# Patient Record
Sex: Female | Born: 1940 | Race: White | Hispanic: No | Marital: Married | Smoking: Never smoker
Health system: Southern US, Community
[De-identification: ages and names within clinical notes are randomized; demographics above are authoritative.]

## PROBLEM LIST (undated history)

## (undated) DIAGNOSIS — E079 Disorder of thyroid, unspecified: Secondary | ICD-10-CM

## (undated) DIAGNOSIS — F4310 Post-traumatic stress disorder, unspecified: Secondary | ICD-10-CM

## (undated) DIAGNOSIS — M1990 Unspecified osteoarthritis, unspecified site: Secondary | ICD-10-CM

## (undated) DIAGNOSIS — I10 Essential (primary) hypertension: Secondary | ICD-10-CM

## (undated) DIAGNOSIS — E039 Hypothyroidism, unspecified: Secondary | ICD-10-CM

## (undated) DIAGNOSIS — E785 Hyperlipidemia, unspecified: Secondary | ICD-10-CM

## (undated) DIAGNOSIS — R079 Chest pain, unspecified: Secondary | ICD-10-CM

## (undated) DIAGNOSIS — G4733 Obstructive sleep apnea (adult) (pediatric): Secondary | ICD-10-CM

## (undated) DIAGNOSIS — H9190 Unspecified hearing loss, unspecified ear: Secondary | ICD-10-CM

## (undated) DIAGNOSIS — E7800 Pure hypercholesterolemia, unspecified: Secondary | ICD-10-CM

## (undated) DIAGNOSIS — K922 Gastrointestinal hemorrhage, unspecified: Secondary | ICD-10-CM

## (undated) DIAGNOSIS — D126 Benign neoplasm of colon, unspecified: Secondary | ICD-10-CM

## (undated) HISTORY — DX: Pure hypercholesterolemia, unspecified: E78.00

## (undated) HISTORY — DX: Unspecified hearing loss, unspecified ear: H91.90

## (undated) HISTORY — DX: Chest pain, unspecified: R07.9

## (undated) HISTORY — DX: Essential (primary) hypertension: I10

## (undated) HISTORY — PX: CHOLECYSTECTOMY: SHX55

## (undated) HISTORY — DX: Obstructive sleep apnea (adult) (pediatric): G47.33

## (undated) HISTORY — PX: KNEE SURGERY: SHX244

## (undated) HISTORY — DX: Gastrointestinal hemorrhage, unspecified: K92.2

## (undated) HISTORY — PX: CARDIAC CATHETERIZATION: SHX172

## (undated) HISTORY — DX: Unspecified osteoarthritis, unspecified site: M19.90

## (undated) HISTORY — DX: Hyperlipidemia, unspecified: E78.5

---

## 1997-12-27 ENCOUNTER — Ambulatory Visit (HOSPITAL_COMMUNITY): Admission: RE | Admit: 1997-12-27 | Discharge: 1997-12-27 | Payer: Self-pay | Admitting: Family Medicine

## 1998-05-31 ENCOUNTER — Ambulatory Visit (HOSPITAL_COMMUNITY): Admission: RE | Admit: 1998-05-31 | Discharge: 1998-05-31 | Payer: Self-pay | Admitting: Family Medicine

## 1998-05-31 ENCOUNTER — Encounter: Payer: Self-pay | Admitting: Family Medicine

## 1998-07-20 ENCOUNTER — Ambulatory Visit (HOSPITAL_COMMUNITY): Admission: RE | Admit: 1998-07-20 | Discharge: 1998-07-20 | Payer: Self-pay | Admitting: Family Medicine

## 1998-07-20 ENCOUNTER — Encounter: Payer: Self-pay | Admitting: Family Medicine

## 1999-09-25 ENCOUNTER — Other Ambulatory Visit: Admission: RE | Admit: 1999-09-25 | Discharge: 1999-09-25 | Payer: Self-pay | Admitting: Family Medicine

## 1999-10-08 ENCOUNTER — Encounter: Admission: RE | Admit: 1999-10-08 | Discharge: 1999-10-08 | Payer: Self-pay | Admitting: Family Medicine

## 1999-10-08 ENCOUNTER — Encounter: Payer: Self-pay | Admitting: Family Medicine

## 2000-05-19 ENCOUNTER — Ambulatory Visit (HOSPITAL_BASED_OUTPATIENT_CLINIC_OR_DEPARTMENT_OTHER): Admission: RE | Admit: 2000-05-19 | Discharge: 2000-05-19 | Payer: Self-pay | Admitting: Orthopaedic Surgery

## 2000-10-23 ENCOUNTER — Other Ambulatory Visit: Admission: RE | Admit: 2000-10-23 | Discharge: 2000-10-23 | Payer: Self-pay | Admitting: Family Medicine

## 2000-11-16 ENCOUNTER — Ambulatory Visit (HOSPITAL_COMMUNITY): Admission: RE | Admit: 2000-11-16 | Discharge: 2000-11-16 | Payer: Self-pay | Admitting: Family Medicine

## 2000-11-16 ENCOUNTER — Encounter: Payer: Self-pay | Admitting: Family Medicine

## 2001-12-02 ENCOUNTER — Inpatient Hospital Stay (HOSPITAL_COMMUNITY): Admission: RE | Admit: 2001-12-02 | Discharge: 2001-12-06 | Payer: Self-pay | Admitting: Orthopaedic Surgery

## 2003-01-31 ENCOUNTER — Ambulatory Visit (HOSPITAL_COMMUNITY): Admission: RE | Admit: 2003-01-31 | Discharge: 2003-01-31 | Payer: Self-pay | Admitting: Family Medicine

## 2003-01-31 ENCOUNTER — Encounter: Payer: Self-pay | Admitting: Family Medicine

## 2006-04-22 ENCOUNTER — Other Ambulatory Visit: Admission: RE | Admit: 2006-04-22 | Discharge: 2006-04-22 | Payer: Self-pay | Admitting: Family Medicine

## 2008-01-21 ENCOUNTER — Other Ambulatory Visit: Admission: RE | Admit: 2008-01-21 | Discharge: 2008-01-21 | Payer: Self-pay | Admitting: Family Medicine

## 2008-02-04 ENCOUNTER — Encounter: Admission: RE | Admit: 2008-02-04 | Discharge: 2008-02-04 | Payer: Self-pay | Admitting: Family Medicine

## 2008-02-15 ENCOUNTER — Encounter: Admission: RE | Admit: 2008-02-15 | Discharge: 2008-02-15 | Payer: Self-pay | Admitting: Family Medicine

## 2010-05-19 ENCOUNTER — Encounter: Payer: Self-pay | Admitting: Family Medicine

## 2010-09-13 NOTE — Op Note (Signed)
NAMESHAWNTIA, MANGAL                               ACCOUNT NO.:  [id number]   MEDICAL RECORD NO.:  [id number]                   PATIENT TYPE:  INP   LOCATION:  5027                                 FACILITY:  [facility]   PHYSICIAN:  Lubertha Basque. Jerl Santos, M.D.             DATE OF BIRTH:  May 12, 1940   DATE OF PROCEDURE:  12/02/2001  DATE OF DISCHARGE:                                 OPERATIVE REPORT   PREOPERATIVE DIAGNOSIS:  Left knee degenerative arthritis.   POSTOPERATIVE DIAGNOSIS:  Left knee degenerative arthritis.   PROCEDURE:  Left total knee replacement.   SURGEON:  Lubertha Basque. Jerl Santos, M.D.   ASSISTANT:  Prince Rome, P.A.   ANESTHESIA:  General and femoral block.   INDICATIONS FOR PROCEDURE:  The patient is a 70 year old woman with a long  history of left knee pain.  This has been persistent despite oral anti-  inflammatories, injections of various agents and even an arthroscopy in the  past at which point significant degeneration was seen.  She has been left  with pain at rest and pain with activity and is offered a knee replacement  operation.  The procedure was discussed with the patient and informed  operative consent was obtained after discussion of possible complications  of, reaction to anesthesia, infection, DVT, PE, and death.   DESCRIPTION OF PROCEDURE:  The patient was taken to the operating suite  where general anesthetic was applied without difficulty.  She was positioned  and prepped and draped in the normal sterile fashion.  After the  administration of preoperative antibiotics, the left leg was elevated,  exsanguinated and tourniquet inflated up to 5.  An anterior approach was  taken to the knee through a longitudinal incision.  Dissection was taken  down to the extensor mechanism.  This was incised for a medial parapatellar  approach.  The knee cap was flipped and the knee flexed.  Some residual  meniscal tissues in the ACL and PCL were removed.  She had  good bone  quality.  She had severe degenerative change medial and patellofemoral  compartments.  A guide was placed on the tibia extramedullary to create a  cut on the tibia parallel with the floor with a slight posterior tilt.  This  had to be revised once.  The femur sized to a standard size component, and  an intramedullary guide was placed and utilized to make anterior and  posterior cuts creating a flexion gap of 10 mm.  A second intramedullary  guide was placed to create a distal cut on the femur making an extension gap  of the 10 mm and equal to the flexion gap.  A bit of soft tissue was  released on the medial aspect was done with a Cobb elevator.  The femur  chamfer guide was placed and used.  The tibia sized to a size 3 and  appropriate guide was placed and used to place a keel component.  The trial  reduction as done with a standard femur, size 3 tibia and 10 mm deep dish  spacer.  She came to slight hyperextension and flexed easily.  The knee cap  tracked well and no lateral release was required.  About 11 or 12 mm of bone  were cut off the patella and this fit a size 32 component.  The appropriate  drill holes were made with the guide.  Trial components were removed, and  pulsatile lavage was used to clean off cut bony surfaces.  Cement was mixed  including antibiotics and this was pressurized on to all three cut bony  surfaces followed by placement of the Dupuy LCS components which were a  standard femur, 3 tibia and 32 patella.  A 10 mm deep dish spacer as  applied.  Excess cement was trimmed, and pressure was held on the components  until the cement had hardened.  Again, the knee tracked well from full  extension to 120 of flexion.  The knee cap tracked well.  The wound was  again irrigated followed by release of the tourniquet.  A small amount of  bleeding was easily controlled with Bovie cautery.  Drain was placed exiting  superior laterally followed by reapproximation  with the extensor mechanism  with #1 Vicryl in an interrupted fashion.  Once this was accomplished, the  knee flexed to 120 stabilely.  Again, the wound was irrigated, followed by  reapproximation of subcutaneous tissues with 0 and 2-0 undyed Vicryl.  The  skin was closed with nylon as she has a nickel allergy.  Adaptic was applied  to the wound followed by a dry gauze and a loose Ace wrap.  Estimated blood  loss and intraoperative fluids can be obtained from the anesthesia records  as can accurate tourniquet time which was just under one hour.  A femoral  nerve block was applied at the end of the case.   DISPOSITION:  The patient was extubated in the operating room and taken to  the recovery room in stable condition.  Plans were for her to be admitted to  the orthopedic surgery surface for postoperative care to include  perioperative antibiotics and Coumadin for DVT prophylaxis.                                               Lubertha Basque Jerl Santos, M.D.    PGD/MEDQ  D:  12/02/2001  T:  12/06/2001  Job:  [id number]

## 2010-09-13 NOTE — Op Note (Signed)
[place]  Patient:    Cheryl Velez, Cheryl Velez                              MRN: [id number] Proc. Date: 05/19/00 Adm. Date:  [id number] Attending:  Marcene Corning                           Operative Report  PREOPERATIVE DIAGNOSES: 1. Left knee degenerative joint disease. 2. Left knee torn medial meniscus.  POSTOPERATIVE DIAGNOSES: 1. Left knee degenerative joint disease. 2. Left knee torn medial meniscus.  PROCEDURES: 1. Left knee thorough chondroplasty. 2. Left knee partial medial meniscectomy.  ANESTHESIA:  Knee block.  SURGEON:  Lubertha Basque. Jerl Santos, M.D.  ASSISTANT:  Prince Rome, P.A.  INDICATION FOR PROCEDURE:  The patient is a 70 year old woman with a long history of left knee pain.  It has persisted despite anti-inflammatories and injection.  By x-ray, she did have some medial joint space narrowing, and she had some signs on exam of degenerative arthritis and medial joint line pain. The planned procedure at this point is for an arthroscopy.  The procedure was discussed with the patient, and informed operative consent was obtained after a discussion of the possible complications of, reaction to anesthesia, and infection.  DESCRIPTION OF PROCEDURE:  The patient was taken to the operating suite, where a knee block anesthetic was applied without difficulty.  She was positioned supine and prepped and draped in normal sterile fashion.  After the administration of preoperative IV antibiotics, an arthroscopy of the left knee was performed through two inferior portals.  The suprapatellar pouch was benign, while the patellofemoral joint exhibited some grade 3 chondromalacia, addressed with a chondroplasty.  In the medial compartment, she had some large flap tears on the medial femoral condyle, which were addressed with a chondroplasty.  She did have some grade 4 change in the compartment.  She also had a degenerative tear of the posterior horn of  the medial meniscus, which required removal of about 20% of the structure back to a stable rim.  The ACL and PCL were intact, though the notch was stenotic.  The lateral compartment exhibited some fraying of the meniscus with no frank tear.  She did have some articular cartilage tears, which required a thorough chondroplasty once again, exposing some grade 4 change.  The knee was thoroughly irrigated at the end of the case, followed by the placement of Marcaine with epinephrine and morphine and Depo-Medrol.  Adaptic was placed over the portals, followed by dry gauze and a loose Ace wrap.  Estimated blood loss and intraoperative fluids can be obtained from the anesthesia records.  DISPOSITION:  The patient was taken to the recovery room in stable condition. Plans were for her to go home the same day and follow up in the office in less than a week.  I will contact her by phone tonight. DD:  05/19/00 TD:  05/19/00 Job: [id number]

## 2010-09-13 NOTE — Discharge Summary (Signed)
   [name], Cheryl Velez                               ACCOUNT NO.:  [id number]   MEDICAL RECORD NO.:  [id number]                   PATIENT TYPE:  INP   LOCATION:  5027                                 FACILITY:  [facility]   PHYSICIAN:  Lubertha Basque. Jerl Santos, M.D.             DATE OF BIRTH:  [birth year]   DATE OF ADMISSION:  12/02/2001  DATE OF DISCHARGE:  12/06/2001                                 DISCHARGE SUMMARY   ADMISSION DIAGNOSIS:  1. End-stage degenerative joint disease, left knee,  2. Status post arthroscopy.  3. History of cardiac catheterization.   DISCHARGE DIAGNOSES:  1. End-stage degenerative joint disease, left knee,  2. Status post arthroscopy.  3. History of cardiac catheterization.   OPERATION:  Left total knee replacement.   HISTORY:  The patient is a 70 year old white female patient well known to  our practice. She is having pain with every step in her left knee with  trouble sleeping at night time.  She has had a knee arthroscopy done January  2002 which showed significant degenerative change.  We have tried  corticosteroid injections and supplementation with Synvisc, and she  continues to have the above-mentioned pain.  We discussed treatment options  with her being total knee replacement.   LABORATORY DATA:  She has normal sinus rhythm on EKG.   Hemoglobin 10.1, hematocrit 29.8.  Serial pro times were drawn, and she is  on low-dose Coumadin protocol.   HOSPITAL COURSE:  The patient was admitted postoperatively and placed on a  variety of p.o. and IM analgesics for pain.  Appropriate IM and p.o. pain  medicines given for control of her discomfort.  Physical therapy was ordered  to be weightbearing as tolerated with crutches or walker and pharmacy for  low-dose Coumadin protocol for DVT prophylaxis.  She, on her second day  postop, had her dressing changed and drains pulled, and wounds were benign.  She progressed well and was discharged home.   DISCHARGE  MEDICATIONS:  She will be on Coumadin for a total of four weeks  postoperatively. She was given a prescription for Tylox.   ACTIVITY:  Weightbearing as tolerated.   FOLLOW UP:  Followup with home physical therapy.  We will see her back in  the office in one week.     Prince Rome, P.A.                 Lubertha Basque Jerl Santos, M.D.    MRC/MEDQ  D:  12/21/2001  T:  12/23/2001  Job:  [id number]

## 2012-02-05 DIAGNOSIS — Z1331 Encounter for screening for depression: Secondary | ICD-10-CM | POA: Diagnosis not present

## 2012-02-05 DIAGNOSIS — E785 Hyperlipidemia, unspecified: Secondary | ICD-10-CM | POA: Diagnosis not present

## 2012-02-05 DIAGNOSIS — R5381 Other malaise: Secondary | ICD-10-CM | POA: Diagnosis not present

## 2012-02-05 DIAGNOSIS — E039 Hypothyroidism, unspecified: Secondary | ICD-10-CM | POA: Diagnosis not present

## 2012-02-05 DIAGNOSIS — Z23 Encounter for immunization: Secondary | ICD-10-CM | POA: Diagnosis not present

## 2012-02-11 DIAGNOSIS — H18519 Endothelial corneal dystrophy, unspecified eye: Secondary | ICD-10-CM | POA: Diagnosis not present

## 2012-03-17 ENCOUNTER — Ambulatory Visit: Payer: Self-pay | Admitting: Ophthalmology

## 2012-03-17 DIAGNOSIS — Z01810 Encounter for preprocedural cardiovascular examination: Secondary | ICD-10-CM | POA: Diagnosis not present

## 2012-03-17 DIAGNOSIS — H2510 Age-related nuclear cataract, unspecified eye: Secondary | ICD-10-CM | POA: Diagnosis not present

## 2012-03-21 DIAGNOSIS — G4733 Obstructive sleep apnea (adult) (pediatric): Secondary | ICD-10-CM | POA: Diagnosis not present

## 2012-03-29 ENCOUNTER — Ambulatory Visit: Payer: Self-pay | Admitting: Ophthalmology

## 2012-03-29 DIAGNOSIS — Z7982 Long term (current) use of aspirin: Secondary | ICD-10-CM | POA: Diagnosis not present

## 2012-03-29 DIAGNOSIS — Z889 Allergy status to unspecified drugs, medicaments and biological substances status: Secondary | ICD-10-CM | POA: Diagnosis not present

## 2012-03-29 DIAGNOSIS — E079 Disorder of thyroid, unspecified: Secondary | ICD-10-CM | POA: Diagnosis not present

## 2012-03-29 DIAGNOSIS — Z96659 Presence of unspecified artificial knee joint: Secondary | ICD-10-CM | POA: Diagnosis not present

## 2012-03-29 DIAGNOSIS — H2510 Age-related nuclear cataract, unspecified eye: Secondary | ICD-10-CM | POA: Diagnosis not present

## 2012-03-29 DIAGNOSIS — H269 Unspecified cataract: Secondary | ICD-10-CM | POA: Diagnosis not present

## 2012-03-29 DIAGNOSIS — M129 Arthropathy, unspecified: Secondary | ICD-10-CM | POA: Diagnosis not present

## 2012-03-29 DIAGNOSIS — F4310 Post-traumatic stress disorder, unspecified: Secondary | ICD-10-CM | POA: Diagnosis not present

## 2012-03-29 DIAGNOSIS — K219 Gastro-esophageal reflux disease without esophagitis: Secondary | ICD-10-CM | POA: Diagnosis not present

## 2012-03-29 DIAGNOSIS — R011 Cardiac murmur, unspecified: Secondary | ICD-10-CM | POA: Diagnosis not present

## 2012-03-29 DIAGNOSIS — E7800 Pure hypercholesterolemia, unspecified: Secondary | ICD-10-CM | POA: Diagnosis not present

## 2012-03-29 DIAGNOSIS — R002 Palpitations: Secondary | ICD-10-CM | POA: Diagnosis not present

## 2012-03-29 DIAGNOSIS — Z91013 Allergy to seafood: Secondary | ICD-10-CM | POA: Diagnosis not present

## 2012-03-29 DIAGNOSIS — R059 Cough, unspecified: Secondary | ICD-10-CM | POA: Diagnosis not present

## 2012-03-29 DIAGNOSIS — Z79899 Other long term (current) drug therapy: Secondary | ICD-10-CM | POA: Diagnosis not present

## 2012-04-08 DIAGNOSIS — H2510 Age-related nuclear cataract, unspecified eye: Secondary | ICD-10-CM | POA: Diagnosis not present

## 2012-04-19 ENCOUNTER — Ambulatory Visit: Payer: Self-pay | Admitting: Ophthalmology

## 2012-04-19 DIAGNOSIS — H269 Unspecified cataract: Secondary | ICD-10-CM | POA: Diagnosis not present

## 2012-04-19 DIAGNOSIS — K219 Gastro-esophageal reflux disease without esophagitis: Secondary | ICD-10-CM | POA: Diagnosis not present

## 2012-04-19 DIAGNOSIS — Z96659 Presence of unspecified artificial knee joint: Secondary | ICD-10-CM | POA: Diagnosis not present

## 2012-04-19 DIAGNOSIS — Z79899 Other long term (current) drug therapy: Secondary | ICD-10-CM | POA: Diagnosis not present

## 2012-04-19 DIAGNOSIS — R011 Cardiac murmur, unspecified: Secondary | ICD-10-CM | POA: Diagnosis not present

## 2012-04-19 DIAGNOSIS — G4730 Sleep apnea, unspecified: Secondary | ICD-10-CM | POA: Diagnosis not present

## 2012-04-19 DIAGNOSIS — H2510 Age-related nuclear cataract, unspecified eye: Secondary | ICD-10-CM | POA: Diagnosis not present

## 2012-04-19 DIAGNOSIS — Z7982 Long term (current) use of aspirin: Secondary | ICD-10-CM | POA: Diagnosis not present

## 2012-04-19 DIAGNOSIS — M129 Arthropathy, unspecified: Secondary | ICD-10-CM | POA: Diagnosis not present

## 2012-04-19 DIAGNOSIS — E7800 Pure hypercholesterolemia, unspecified: Secondary | ICD-10-CM | POA: Diagnosis not present

## 2012-04-19 DIAGNOSIS — E079 Disorder of thyroid, unspecified: Secondary | ICD-10-CM | POA: Diagnosis not present

## 2012-05-20 ENCOUNTER — Other Ambulatory Visit: Payer: Self-pay | Admitting: Family Medicine

## 2012-05-20 ENCOUNTER — Other Ambulatory Visit (HOSPITAL_COMMUNITY)
Admission: RE | Admit: 2012-05-20 | Discharge: 2012-05-20 | Disposition: A | Discharge status: Home / Self Care | Payer: Medicare Other | Source: Ambulatory Visit | Attending: Family Medicine | Admitting: Family Medicine

## 2012-05-20 DIAGNOSIS — Z8371 Family history of colonic polyps: Secondary | ICD-10-CM | POA: Diagnosis not present

## 2012-05-20 DIAGNOSIS — Z124 Encounter for screening for malignant neoplasm of cervix: Secondary | ICD-10-CM | POA: Diagnosis not present

## 2012-05-20 DIAGNOSIS — N951 Menopausal and female climacteric states: Secondary | ICD-10-CM | POA: Diagnosis not present

## 2012-05-20 DIAGNOSIS — Z0000 Encounter for general adult medical examination without abnormal findings: Secondary | ICD-10-CM | POA: Diagnosis not present

## 2012-10-01 ENCOUNTER — Other Ambulatory Visit: Payer: Self-pay | Admitting: Gastroenterology

## 2012-10-01 DIAGNOSIS — D126 Benign neoplasm of colon, unspecified: Secondary | ICD-10-CM | POA: Diagnosis not present

## 2012-10-01 DIAGNOSIS — K648 Other hemorrhoids: Secondary | ICD-10-CM | POA: Diagnosis not present

## 2012-10-01 DIAGNOSIS — Z8371 Family history of colonic polyps: Secondary | ICD-10-CM | POA: Diagnosis not present

## 2012-10-01 DIAGNOSIS — Z1211 Encounter for screening for malignant neoplasm of colon: Secondary | ICD-10-CM | POA: Diagnosis not present

## 2012-10-01 DIAGNOSIS — K5730 Diverticulosis of large intestine without perforation or abscess without bleeding: Secondary | ICD-10-CM | POA: Diagnosis not present

## 2012-11-16 DIAGNOSIS — E782 Mixed hyperlipidemia: Secondary | ICD-10-CM | POA: Diagnosis not present

## 2012-11-16 DIAGNOSIS — E785 Hyperlipidemia, unspecified: Secondary | ICD-10-CM | POA: Diagnosis not present

## 2013-01-04 DIAGNOSIS — H2510 Age-related nuclear cataract, unspecified eye: Secondary | ICD-10-CM | POA: Diagnosis not present

## 2013-01-04 DIAGNOSIS — Z961 Presence of intraocular lens: Secondary | ICD-10-CM | POA: Diagnosis not present

## 2013-01-06 ENCOUNTER — Encounter (HOSPITAL_COMMUNITY): Payer: Self-pay | Admitting: Adult Health

## 2013-01-06 ENCOUNTER — Emergency Department (HOSPITAL_COMMUNITY): Payer: Medicare Other

## 2013-01-06 ENCOUNTER — Observation Stay (HOSPITAL_COMMUNITY)
Admission: EM | Admit: 2013-01-06 | Discharge: 2013-01-08 | Disposition: A | Discharge status: Home / Self Care | Payer: Medicare Other | Attending: Internal Medicine | Admitting: Internal Medicine

## 2013-01-06 DIAGNOSIS — Z79899 Other long term (current) drug therapy: Secondary | ICD-10-CM | POA: Insufficient documentation

## 2013-01-06 DIAGNOSIS — R072 Precordial pain: Secondary | ICD-10-CM | POA: Diagnosis not present

## 2013-01-06 DIAGNOSIS — E785 Hyperlipidemia, unspecified: Secondary | ICD-10-CM

## 2013-01-06 DIAGNOSIS — F4310 Post-traumatic stress disorder, unspecified: Secondary | ICD-10-CM | POA: Diagnosis not present

## 2013-01-06 DIAGNOSIS — E039 Hypothyroidism, unspecified: Secondary | ICD-10-CM | POA: Diagnosis not present

## 2013-01-06 DIAGNOSIS — R9431 Abnormal electrocardiogram [ECG] [EKG]: Secondary | ICD-10-CM

## 2013-01-06 DIAGNOSIS — E669 Obesity, unspecified: Secondary | ICD-10-CM | POA: Diagnosis not present

## 2013-01-06 DIAGNOSIS — R079 Chest pain, unspecified: Principal | ICD-10-CM | POA: Insufficient documentation

## 2013-01-06 DIAGNOSIS — Z23 Encounter for immunization: Secondary | ICD-10-CM | POA: Diagnosis not present

## 2013-01-06 DIAGNOSIS — R0789 Other chest pain: Secondary | ICD-10-CM | POA: Diagnosis present

## 2013-01-06 HISTORY — DX: Chest pain, unspecified: R07.9

## 2013-01-06 HISTORY — DX: Benign neoplasm of colon, unspecified: D12.6

## 2013-01-06 HISTORY — DX: Post-traumatic stress disorder, unspecified: F43.10

## 2013-01-06 HISTORY — DX: Unspecified osteoarthritis, unspecified site: M19.90

## 2013-01-06 HISTORY — DX: Disorder of thyroid, unspecified: E07.9

## 2013-01-06 HISTORY — DX: Hypothyroidism, unspecified: E03.9

## 2013-01-06 LAB — CBC
Hemoglobin: 12.8 g/dL (ref 12.0–15.0)
MCHC: 33.4 g/dL (ref 30.0–36.0)
MCV: 87.6 fL (ref 78.0–100.0)
WBC: 6.9 10*3/uL (ref 4.0–10.5)

## 2013-01-06 LAB — BASIC METABOLIC PANEL
BUN: 14 mg/dL (ref 6–23)
Calcium: 10 mg/dL (ref 8.4–10.5)
Chloride: 105 mEq/L (ref 96–112)
GFR calc non Af Amer: 85 mL/min — ABNORMAL LOW (ref >90)
Glucose, Bld: 113 mg/dL — ABNORMAL HIGH (ref 70–99)
Sodium: 139 mEq/L (ref 135–145)

## 2013-01-06 LAB — TROPONIN I: Troponin I: <0.3 ng/mL (ref <0.30)

## 2013-01-06 LAB — POCT I-STAT TROPONIN I

## 2013-01-06 MED ORDER — ONDANSETRON HCL 4 MG/2ML IJ SOLN: 4.0000 mg | Freq: Four times a day (QID) | INTRAMUSCULAR | Status: DC | PRN

## 2013-01-06 MED ORDER — FENOFIBRATE 160 MG PO TABS
160.0000 mg | ORAL_TABLET | Freq: Every day | ORAL | Status: DC
  Administered 2013-01-06 – 2013-01-08 (×3): 160 mg via ORAL
  Filled 2013-01-06 (×3): qty 1

## 2013-01-06 MED ORDER — ADULT MULTIVITAMIN W/MINERALS CH
1.0000 | ORAL_TABLET | Freq: Every day | ORAL | Status: DC
  Administered 2013-01-06 – 2013-01-08 (×3): 1 via ORAL
  Filled 2013-01-06 (×3): qty 1

## 2013-01-06 MED ORDER — SODIUM CHLORIDE 0.9 % IJ SOLN: 3.0000 mL | Freq: Two times a day (BID) | INTRAMUSCULAR | Status: DC

## 2013-01-06 MED ORDER — NITROGLYCERIN 0.4 MG SL SUBL: 0.4000 mg | SUBLINGUAL_TABLET | SUBLINGUAL | Status: DC | PRN

## 2013-01-06 MED ORDER — ONDANSETRON HCL 4 MG PO TABS: 4.0000 mg | ORAL_TABLET | Freq: Four times a day (QID) | ORAL | Status: DC | PRN

## 2013-01-06 MED ORDER — ASPIRIN EC 325 MG PO TBEC
325.0000 mg | DELAYED_RELEASE_TABLET | Freq: Every day | ORAL | Status: DC
  Administered 2013-01-07 – 2013-01-08 (×2): 325 mg via ORAL
  Filled 2013-01-06: qty 1

## 2013-01-06 MED ORDER — LEVOTHYROXINE SODIUM 75 MCG PO TABS
75.0000 ug | ORAL_TABLET | Freq: Every day | ORAL | Status: DC
  Administered 2013-01-07 – 2013-01-08 (×2): 75 ug via ORAL
  Filled 2013-01-06 (×3): qty 1

## 2013-01-06 MED ORDER — ASPIRIN 325 MG PO TABS
325.0000 mg | ORAL_TABLET | ORAL | Status: AC
  Filled 2013-01-06: qty 1

## 2013-01-06 MED ORDER — ACETAMINOPHEN 650 MG RE SUPP: 650.0000 mg | Freq: Four times a day (QID) | RECTAL | Status: DC | PRN

## 2013-01-06 MED ORDER — ENOXAPARIN SODIUM 40 MG/0.4ML ~~LOC~~ SOLN
40.0000 mg | SUBCUTANEOUS | Status: DC
  Filled 2013-01-06 (×3): qty 0.4

## 2013-01-06 MED ORDER — SODIUM CHLORIDE 0.9 % IJ SOLN
3.0000 mL | Freq: Two times a day (BID) | INTRAMUSCULAR | Status: DC
  Administered 2013-01-06 – 2013-01-08 (×3): 3 mL via INTRAVENOUS

## 2013-01-06 MED ORDER — ACETAMINOPHEN 325 MG PO TABS: 650.0000 mg | ORAL_TABLET | Freq: Four times a day (QID) | ORAL | Status: DC | PRN

## 2013-01-06 MED ORDER — INFLUENZA VAC SPLIT QUAD 0.5 ML IM SUSP
0.5000 mL | INTRAMUSCULAR | Status: AC
  Administered 2013-01-07: 0.5 mL via INTRAMUSCULAR
  Filled 2013-01-06: qty 0.5

## 2013-01-06 NOTE — ED Notes (Signed)
Phlebotomy at bedside.

## 2013-01-06 NOTE — H&P (Signed)
[facility] History and Physical  Cheryl Velez:[id number] DOB: 26-Jul-1940 DOA: 01/06/2013  Referring physician: ER physician. PCP: Cala Bradford, MD   Chief Complaint: Chest pain.  HPI: Cheryl Velez is a 72 y.o. female history of hypothyroidism present with complaints of chest pain. Patient has been having chest pain off and on for last 3-4 days. Chest pain happens in at rest. Chest pain is retrosternal nonradiating no associated nausea vomiting shortness of breath diaphoresis. Pain resolves spontaneously. Patient's pain is not exacerbated on exertion. Presently chest pain-free. Cardiac markers have been negative. EKG shows diffuse T wave inversion no old EKG to compare. Patient states that in 1997 she had a cardiac catheter because EKG was abnormal and as per patient cardiac catheter was normal. Patient has been admitted for further management.  Review of Systems: As presented in the history of presenting illness, rest negative.  Past Medical History  Diagnosis Date  . PTSD (post-traumatic stress disorder)   . Arthritis   . Thyroid disease   . Adenomatous colon polyp   . Hypothyroidism    Past Surgical History  Procedure Laterality Date  . Cardiac catheterization    . Knee surgery     Social History:  reports that she has never smoked. She does not have any smokeless tobacco history on file. She reports that she does not drink alcohol or use illicit drugs. Home. where does patient live-- Yes Can patient participate in ADLs?  Allergies  Allergen Reactions  . Shellfish Allergy Hives, Itching and Rash  . Latex Itching and Rash  . Nickel Itching and Rash  . Other Itching    "unknown pain medication" given benadryl to stop reaction     Family History  Problem Relation Age of Onset  . Stroke Mother   . Stroke Sister   . CAD Brother       Prior to Admission medications   Medication Sig Start Date End Date Taking? Authorizing Provider  aspirin EC 81 MG tablet Take 81 mg  by mouth daily.   Yes Historical Provider, MD  b complex vitamins tablet Take 1 tablet by mouth daily.   Yes Historical Provider, MD  calcium-vitamin D (OSCAL WITH D) 500-200 MG-UNIT per tablet Take 1 tablet by mouth daily with breakfast.   Yes Historical Provider, MD  fenofibrate 160 MG tablet Take 160 mg by mouth daily.   Yes Historical Provider, MD  levothyroxine (SYNTHROID, LEVOTHROID) 75 MCG tablet Take 75 mcg by mouth daily before breakfast.   Yes Historical Provider, MD  Multiple Vitamin (MULTIVITAMIN WITH MINERALS) TABS tablet Take 1 tablet by mouth daily.   Yes Historical Provider, MD   Physical Exam: Filed Vitals:   01/06/13 1830 01/06/13 1845 01/06/13 1915 01/06/13 1930  BP: 148/56 136/71 149/57 144/71  Pulse: 72 68 73 70  Temp:      TempSrc:      Resp: 16 17  18   Weight:      SpO2: 98% 97% 97% 98%     General:  Well-developed and nourished.  Eyes: Anicteric no pallor.  ENT: No discharge from ears eyes nose mouth.  Neck: No mass felt.  Cardiovascular: S1-S2 heard.  Respiratory: No rhonchi or crepitations.  Abdomen: Soft nontender bowel sounds present.  Skin: No rash.  Musculoskeletal: No edema.  Psychiatric: Appears normal.  Neurologic: Alert awake oriented to time place and person. Moves all extremities.  Labs on Admission:  Basic Metabolic Panel:  Recent Labs Lab 01/06/13 1827  NA 139  K 4.1  CL 105  CO2 26  GLUCOSE 113*  BUN 14  CREATININE 0.72  CALCIUM 10.0   Liver Function Tests: No results found for this basename: AST, ALT, ALKPHOS, BILITOT, PROT, ALBUMIN,  in the last 168 hours No results found for this basename: LIPASE, AMYLASE,  in the last 168 hours No results found for this basename: AMMONIA,  in the last 168 hours CBC:  Recent Labs Lab 01/06/13 1827  WBC 6.9  HGB 12.8  HCT 38.3  MCV 87.6  PLT 286   Cardiac Enzymes: No results found for this basename: CKTOTAL, CKMB, CKMBINDEX, TROPONINI,  in the last 168 hours  BNP (last  3 results) No results found for this basename: PROBNP,  in the last 8760 hours CBG: No results found for this basename: GLUCAP,  in the last 168 hours  Radiological Exams on Admission: Dg Chest 2 View  01/06/2013   *RADIOLOGY REPORT*  Clinical Data: Chest pain  CHEST - 2 VIEW  Comparison: None.  Findings: The heart and pulmonary vascularity are within normal limits.  The lungs are clear bilaterally.  No acute bony abnormality is seen.  IMPRESSION: No acute abnormality noted.   Original Report Authenticated By: Alcide Clever, M.D.    EKG: Independently reviewed. Normal sinus rhythm with diffuse T-wave changes.  Assessment/Plan Principal Problem:   Chest pain Active Problems:   Hypothyroidism   1. Chest pain - the chest pain-free. Cycle cardiac markers. Due to EKG changes we will check 2-D echo. Aspirin. 2.  Hypothyroidism - continue Synthroid.    Code Status: Full code.  Family Communication: Patient's husband at the bedside.  Disposition Plan: Admit for observation.    Cheryl Velez N. [facility] Pager [phone].  If 7PM-7AM, please contact night-coverage [email or web address] Password [facility] [place] 01/06/2013, 8:46 PM

## 2013-01-06 NOTE — ED Notes (Signed)
Presents with 4 days of intermittent sternal chest pressure described as someone pushing on chest. Denies SOB, nausea, diaphoreses. C/o dizziness at times. Family hx of heart attacks at young age. Nothing makes pain better, nothing makes pain worse.

## 2013-01-06 NOTE — ED Notes (Signed)
Pharmacy at bedside

## 2013-01-06 NOTE — ED Notes (Signed)
Pt back from radiology. Registration at bedside.

## 2013-01-06 NOTE — ED Notes (Signed)
Pt c/o chest pressure in central chest that started 5 days ago, reports the pressure is intermittent, reports its not extreme pain but is bugging her. Describes as sharp pain. Reports had a similar episode in the past, was under extreme stress at the moment never knew what was causing it. sts she is under a lot of stress now also. Pt in nad, skin warm and dry, resp e/u.

## 2013-01-06 NOTE — ED Provider Notes (Signed)
CSN: [id number]     Arrival date & time 01/06/13  1628 History   First MD Initiated Contact with Patient 01/06/13 1656     Chief Complaint  Patient presents with  . Chest Pain   (Consider location/radiation/quality/duration/timing/severity/associated sxs/prior Treatment) HPI Comments: Patient presents from PCPs office with four-day history of intermittent substernal chest pressure that feels like someone is pushing her in the chest. This lasted about 20-30 minutes at a time and resolves. Denies any shortness of breath, nausea diaphoresis. Pain does not radiate. Nothing makes it better or worse. Reports having this pain this previously in 1990s and had negative catheterization in 1997. She's had no stress testing since then. She denies any leg pain or swelling. Denies abdominal pain, nausea or vomiting. She's a history of high cholesterol and family history of CAD. Does not smoke.  The history is provided by the patient and the spouse.    Past Medical History  Diagnosis Date  . PTSD (post-traumatic stress disorder)   . Arthritis   . Thyroid disease   . Adenomatous colon polyp    Past Surgical History  Procedure Laterality Date  . Cardiac catheterization     History reviewed. No pertinent family history. History  Substance Use Topics  . Smoking status: Never Smoker   . Smokeless tobacco: Not on file  . Alcohol Use: No   OB History   Grav Para Term Preterm Abortions TAB SAB Ect Mult Living                 Review of Systems  Constitutional: Positive for activity change. Negative for fever.  HENT: Negative for congestion and rhinorrhea.   Eyes: Negative for visual disturbance.  Respiratory: Positive for chest tightness. Negative for cough and shortness of breath.   Cardiovascular: Positive for chest pain. Negative for leg swelling.  Gastrointestinal: Negative for nausea, vomiting and abdominal pain.  Genitourinary: Negative for dysuria, hematuria, vaginal bleeding and vaginal  discharge.  Musculoskeletal: Negative for back pain.  Neurological: Positive for dizziness and weakness. Negative for light-headedness and headaches.  Hematological: Negative.   A complete 10 system review of systems was obtained and all systems are negative except as noted in the HPI and PMH.    Allergies  Shellfish allergy; Latex; Nickel; and Other  Home Medications   Current Outpatient Rx  Name  Route  Sig  Dispense  Refill  . aspirin EC 81 MG tablet   Oral   Take 81 mg by mouth daily.         Marland Kitchen b complex vitamins tablet   Oral   Take 1 tablet by mouth daily.         . calcium-vitamin D (OSCAL WITH D) 500-200 MG-UNIT per tablet   Oral   Take 1 tablet by mouth daily with breakfast.         . fenofibrate 160 MG tablet   Oral   Take 160 mg by mouth daily.         Marland Kitchen levothyroxine (SYNTHROID, LEVOTHROID) 75 MCG tablet   Oral   Take 75 mcg by mouth daily before breakfast.         . Multiple Vitamin (MULTIVITAMIN WITH MINERALS) TABS tablet   Oral   Take 1 tablet by mouth daily.          BP 144/71  Pulse 70  Temp(Src) 98.6 F (37 C) (Oral)  Resp 18  Wt 214 lb 4 oz (97.183 kg)  SpO2 98% Physical Exam  Constitutional: She is oriented to person, place, and time. She appears well-developed and well-nourished. No distress.  HENT:  Head: Normocephalic and atraumatic.  Mouth/Throat: Oropharynx is clear and moist. No oropharyngeal exudate.  Eyes: Conjunctivae and EOM are normal. Pupils are equal, round, and reactive to light.  Neck: Normal range of motion. Neck supple.  Cardiovascular: Normal rate, regular rhythm and normal heart sounds.   Pulmonary/Chest: Effort normal and breath sounds normal. No respiratory distress. She exhibits no tenderness.  Abdominal: Soft. There is no tenderness. There is no rebound and no guarding.  Musculoskeletal: Normal range of motion. She exhibits no edema and no tenderness.  Neurological: She is alert and oriented to person,  place, and time. No cranial nerve deficit. She exhibits normal muscle tone. Coordination normal.  Skin: Skin is warm.    ED Course  Procedures (including critical care time) Labs Review Labs Reviewed  BASIC METABOLIC PANEL - Abnormal; Notable for the following:    Glucose, Bld 113 (*)    GFR calc non Af Amer 85 (*)    All other components within normal limits  CBC  D-DIMER, QUANTITATIVE  TROPONIN I  POCT I-STAT TROPONIN I   Imaging Review Dg Chest 2 View  01/06/2013   *RADIOLOGY REPORT*  Clinical Data: Chest pain  CHEST - 2 VIEW  Comparison: None.  Findings: The heart and pulmonary vascularity are within normal limits.  The lungs are clear bilaterally.  No acute bony abnormality is seen.  IMPRESSION: No acute abnormality noted.   Original Report Authenticated By: Alcide Clever, M.D.    MDM   1. Chest pain   2. EKG abnormality    Four day history of substernal chest pain that is intermittent. No associated symptoms. EKG shows inferior lateral T wave changes. No comparison. History somewhat suspicious for angina.  No recent cardiac testing.     troponin and d-dimer negative. EKG discussed with on-call cardiologist. Given patient's age and risk factors he recommends admission for rule out. We'll discuss with hospitalist.   Date: 01/06/2013  Rate: 86  Rhythm: normal sinus rhythm  QRS Axis: normal  Intervals: normal  ST/T Wave abnormalities: nonspecific T wave changes  Conduction Disutrbances:none  Narrative Interpretation: T wave inversions inferior laterally  Old EKG Reviewed: none available      Glynn Octave, MD 01/06/13 2011

## 2013-01-07 DIAGNOSIS — R9431 Abnormal electrocardiogram [ECG] [EKG]: Secondary | ICD-10-CM

## 2013-01-07 DIAGNOSIS — R079 Chest pain, unspecified: Secondary | ICD-10-CM

## 2013-01-07 LAB — BASIC METABOLIC PANEL
BUN: 14 mg/dL (ref 6–23)
Calcium: 9.7 mg/dL (ref 8.4–10.5)
Chloride: 106 mEq/L (ref 96–112)
Creatinine, Ser: 0.71 mg/dL (ref 0.50–1.10)
GFR calc Af Amer: >90 mL/min (ref >90)

## 2013-01-07 LAB — TROPONIN I
Troponin I: <0.3 ng/mL (ref <0.30)
Troponin I: <0.3 ng/mL (ref <0.30)

## 2013-01-07 LAB — CBC
HCT: 37.7 % (ref 36.0–46.0)
MCHC: 33.7 g/dL (ref 30.0–36.0)
MCV: 86.9 fL (ref 78.0–100.0)
Platelets: 264 10*3/uL (ref 150–400)
RDW: 13.6 % (ref 11.5–15.5)
WBC: 7.1 10*3/uL (ref 4.0–10.5)

## 2013-01-07 NOTE — Consult Note (Signed)
Reason for Consult: Chest Pain Referring Physician:   [name] is an 72 y.o. female.  HPI:    The patient is a 72 yo obese female with a history of normal coronaries back in 1997, when Dr. Allyson Sabal cathed her, hypothyroidism, PTSD.  She has never been a smoker.  She has a brother who had a massive MI in his 50's(heavy tobacco user).  The patient presented with four days of CP which she describes as "a fist pushing down on my chest".  At its worst, it was 4/10 with no radiation, diaphoresis, SOB, NV.  Activity did not appear to make the pain any worse and inactivity did not make it better.  She also denies fever, orthopnea, dizziness, PND, cough, congestion, abdominal pain, hematochezia, melena, lower extremity edema.   Past Medical History  Diagnosis Date  . PTSD (post-traumatic stress disorder)   . Arthritis   . Thyroid disease   . Adenomatous colon polyp   . Hypothyroidism     Past Surgical History  Procedure Laterality Date  . Cardiac catheterization    . Knee surgery      Family History  Problem Relation Age of Onset  . Stroke Mother   . Stroke Sister   . CAD Brother     Social History:  reports that she has never smoked. She does not have any smokeless tobacco history on file. She reports that she does not drink alcohol or use illicit drugs.  Allergies:  Allergies  Allergen Reactions  . Shellfish Allergy Hives, Itching and Rash  . Latex Itching and Rash  . Nickel Itching and Rash  . Other Itching    "unknown pain medication" given benadryl to stop reaction     Medications: Prior to Admission medications   Medication Sig Start Date End Date Taking? Authorizing Provider  aspirin EC 81 MG tablet Take 81 mg by mouth daily.   Yes Historical Provider, MD  b complex vitamins tablet Take 1 tablet by mouth daily.   Yes Historical Provider, MD  calcium-vitamin D (OSCAL WITH D) 500-200 MG-UNIT per tablet Take 1 tablet by mouth daily with breakfast.   Yes Historical Provider, MD   fenofibrate 160 MG tablet Take 160 mg by mouth daily.   Yes Historical Provider, MD  levothyroxine (SYNTHROID, LEVOTHROID) 75 MCG tablet Take 75 mcg by mouth daily before breakfast.   Yes Historical Provider, MD  Multiple Vitamin (MULTIVITAMIN WITH MINERALS) TABS tablet Take 1 tablet by mouth daily.   Yes Historical Provider, MD     Results for orders placed during the hospital encounter of 01/06/13 (from the past 48 hour(s))  D-DIMER, QUANTITATIVE     Status: None   Collection Time    01/06/13  6:26 PM      Result Value Range   D-Dimer, Quant <0.27  0.00 - 0.48 ug/mL-FEU   Comment:            AT THE INHOUSE ESTABLISHED CUTOFF     VALUE OF 0.48 ug/mL FEU,     THIS ASSAY HAS BEEN DOCUMENTED     IN THE LITERATURE TO HAVE     A SENSITIVITY AND NEGATIVE     PREDICTIVE VALUE OF AT LEAST     98 TO 99%.  THE TEST RESULT     SHOULD BE CORRELATED WITH     AN ASSESSMENT OF THE CLINICAL     PROBABILITY OF DVT / VTE.  CBC     Status: None   Collection  Time    01/06/13  6:27 PM      Result Value Range   WBC 6.9  4.0 - 10.5 K/uL   RBC 4.37  3.87 - 5.11 MIL/uL   Hemoglobin 12.8  12.0 - 15.0 g/dL   HCT 45.4  09.8 - 11.9 %   MCV 87.6  78.0 - 100.0 fL   MCH 29.3  26.0 - 34.0 pg   MCHC 33.4  30.0 - 36.0 g/dL   RDW 14.7  82.9 - 56.2 %   Platelets 286  150 - 400 K/uL  BASIC METABOLIC PANEL     Status: Abnormal   Collection Time    01/06/13  6:27 PM      Result Value Range   Sodium 139  135 - 145 mEq/L   Potassium 4.1  3.5 - 5.1 mEq/L   Chloride 105  96 - 112 mEq/L   CO2 26  19 - 32 mEq/L   Glucose, Bld 113 (*) 70 - 99 mg/dL   BUN 14  6 - 23 mg/dL   Creatinine, Ser 1.30  0.50 - 1.10 mg/dL   Calcium 86.5  8.4 - 78.4 mg/dL   GFR calc non Af Amer 85 (*) >90 mL/min   GFR calc Af Amer >90  >90 mL/min   Comment: (NOTE)     The eGFR has been calculated using the CKD EPI equation.     This calculation has not been validated in all clinical situations.     eGFR's persistently <90 mL/min  signify possible Chronic Kidney     Disease.  POCT I-STAT TROPONIN I     Status: None   Collection Time    01/06/13  6:49 PM      Result Value Range   Troponin i, poc 0.01  0.00 - 0.08 ng/mL   Comment 3            Comment: Due to the release kinetics of cTnI,     a negative result within the first hours     of the onset of symptoms does not rule out     myocardial infarction with certainty.     If myocardial infarction is still suspected,     repeat the test at appropriate intervals.  TROPONIN I     Status: None   Collection Time    01/06/13  7:38 PM      Result Value Range   Troponin I <0.30  <0.30 ng/mL   Comment:            Due to the release kinetics of cTnI,     a negative result within the first hours     of the onset of symptoms does not rule out     myocardial infarction with certainty.     If myocardial infarction is still suspected,     repeat the test at appropriate intervals.  TROPONIN I     Status: None   Collection Time    01/07/13  2:19 AM      Result Value Range   Troponin I <0.30  <0.30 ng/mL   Comment:            Due to the release kinetics of cTnI,     a negative result within the first hours     of the onset of symptoms does not rule out     myocardial infarction with certainty.     If myocardial infarction is still suspected,     repeat  the test at appropriate intervals.  BASIC METABOLIC PANEL     Status: Abnormal   Collection Time    01/07/13  2:19 AM      Result Value Range   Sodium 137  135 - 145 mEq/L   Potassium 3.8  3.5 - 5.1 mEq/L   Chloride 106  96 - 112 mEq/L   CO2 20  19 - 32 mEq/L   Glucose, Bld 147 (*) 70 - 99 mg/dL   BUN 14  6 - 23 mg/dL   Creatinine, Ser 4.09  0.50 - 1.10 mg/dL   Calcium 9.7  8.4 - 81.1 mg/dL   GFR calc non Af Amer 85 (*) >90 mL/min   GFR calc Af Amer >90  >90 mL/min   Comment: (NOTE)     The eGFR has been calculated using the CKD EPI equation.     This calculation has not been validated in all clinical  situations.     eGFR's persistently <90 mL/min signify possible Chronic Kidney     Disease.  CBC     Status: None   Collection Time    01/07/13  2:19 AM      Result Value Range   WBC 7.1  4.0 - 10.5 K/uL   RBC 4.34  3.87 - 5.11 MIL/uL   Hemoglobin 12.7  12.0 - 15.0 g/dL   HCT 91.4  78.2 - 95.6 %   MCV 86.9  78.0 - 100.0 fL   MCH 29.3  26.0 - 34.0 pg   MCHC 33.7  30.0 - 36.0 g/dL   RDW 21.3  08.6 - 57.8 %   Platelets 264  150 - 400 K/uL    Dg Chest 2 View  01/06/2013   *RADIOLOGY REPORT*  Clinical Data: Chest pain  CHEST - 2 VIEW  Comparison: None.  Findings: The heart and pulmonary vascularity are within normal limits.  The lungs are clear bilaterally.  No acute bony abnormality is seen.  IMPRESSION: No acute abnormality noted.   Original Report Authenticated By: Alcide Clever, M.D.    Review of Systems  Constitutional: Negative for fever and diaphoresis.  HENT: Negative for congestion and sore throat.   Respiratory: Negative for cough and shortness of breath.   Cardiovascular: Positive for chest pain ("like a fist pushing down"). Negative for orthopnea, leg swelling and PND.  Gastrointestinal: Negative for nausea, vomiting, abdominal pain, blood in stool and melena.  Genitourinary: Negative for hematuria.  Musculoskeletal: Negative for myalgias.  Neurological: Negative for dizziness.  All other systems reviewed and are negative.   Blood pressure 134/50, pulse 77, temperature 97.6 F (36.4 C), temperature source Oral, resp. rate 18, height 5\' 4"  (1.626 m), weight 208 lb 14.4 oz (94.756 kg), SpO2 94.00%. Physical Exam  Constitutional: She is oriented to person, place, and time. She appears well-developed.  Obese  HENT:  Head: Normocephalic and atraumatic.  Eyes: EOM are normal. Pupils are equal, round, and reactive to light. No scleral icterus.  Neck: Normal range of motion. Neck supple. No JVD present.  Cardiovascular: Normal rate, regular rhythm, S1 normal and S2 normal.    No murmur heard. Pulses:      Radial pulses are 2+ on the right side, and 2+ on the left side.       Dorsalis pedis pulses are 2+ on the right side, and 2+ on the left side.  Respiratory: Effort normal and breath sounds normal. She has no wheezes. She has no rales.  GI: Soft. Bowel sounds  are normal. She exhibits no distension. There is no tenderness.  Musculoskeletal: She exhibits no edema.  Lymphadenopathy:    She has no cervical adenopathy.  Neurological: She is alert and oriented to person, place, and time. She exhibits normal muscle tone.  Skin: Skin is warm and dry.  Psychiatric: She has a normal mood and affect.    Assessment/Plan: Principal Problem:   Chest pain Active Problems:   Hypothyroidism  Plan:  The patient really wants to go to her [place] tomorrow.  I am not sure how much, or if she is, minimizing the pain.  Regardless, her EKG shows TWI in the lateral leads and inferior changes.  Unfortunately the patient ate breakfast.  I will schedule a treadmill myoview for the AM.  She has ruled out for MI.  I have cancelled the echo for now as I do not think it is needed yet.    Cheryl Velez 01/07/2013, 10:16 AM

## 2013-01-07 NOTE — Progress Notes (Signed)
Utilization review completed.  

## 2013-01-07 NOTE — Plan of Care (Signed)
Problem: Phase I Progression Outcomes Goal: Aspirin unless contraindicated Outcome: Not Applicable Date Met:  01/07/13 Given 324 mg of ASA at doctors's office prior to arrival to the floor.

## 2013-01-07 NOTE — Consult Note (Addendum)
Pt. Seen and examined. Agree with the NP/PA-C note as written.  Pleasant 72 yo female with a few cardiac risk factors (age, obesity, mild dyslipidemia). History of chest pain in the past with normal coronaries in 1997. She sometimes has "fluttering" in her chest as well. She has 5 days now of intermittent chest pressure. Not worse with exertion, nor relieved by rest. Medications don't seem to be helpful. She has ruled out for ACS. Still having intermittent chest discomfort, but has eaten today. I agree with keeping her and exercise myoview in the am tomorrow. Hopefully, if negative, we can discharge mid-day.  Will assume care of this patient with primarily cardiac complaints. Discussed with Dr. Joseph Art.  Chrystie Nose, MD, [facility] Attending Cardiologist [facility]

## 2013-01-07 NOTE — Progress Notes (Signed)
[facility] History and Physical   Cheryl Velez ZOX:[id number] DOB: Jul 22, 1940 DOA: 01/06/2013   Referring physician: ER physician. PCP: Cala Bradford, MD    Chief Complaint: Chest pain.   HPI: Cheryl Velez is a 72 y.o. female history of hypothyroidism present with complaints of chest pain. Patient has been having chest pain off and on for last 3-4 days. Chest pain happens in at rest. Chest pain is retrosternal nonradiating no associated nausea vomiting shortness of breath diaphoresis. Pain resolves spontaneously. Patient's pain is not exacerbated on exertion. Presently chest pain-free. Cardiac markers have been negative. EKG shows diffuse T wave inversion no old EKG to compare. Patient states that in 1997 she had a cardiac catheter because EKG was abnormal and as per patient cardiac catheter was normal. Patient has been admitted for further management.   Review of Systems: As presented in the history of presenting illness, rest negative.    Past Medical History   Diagnosis  Date   .  PTSD (post-traumatic stress disorder)     .  Arthritis     .  Thyroid disease     .  Adenomatous colon polyp     .  Hypothyroidism      Past Surgical History   Procedure  Laterality  Date   .  Cardiac catheterization       .  Knee surgery        Social History: reports that she has never smoked. She does not have any smokeless tobacco history on file. She reports that she does not drink alcohol or use illicit drugs. Home. where does patient live-- Yes Can patient participate in ADLs?    Allergies   Allergen  Reactions   .  Shellfish Allergy  Hives, Itching and Rash   .  Latex  Itching and Rash   .  Nickel  Itching and Rash   .  Other  Itching       "unknown pain medication" given benadryl to stop reaction        Family History   Problem  Relation  Age of Onset   .  Stroke  Mother     .  Stroke  Sister     .  CAD  Brother            Prior to Admission medications    Medication  Sig   Start Date  End Date  Taking?  Authorizing Provider   aspirin EC 81 MG tablet  Take 81 mg by mouth daily.      Yes  Historical Provider, MD   b complex vitamins tablet  Take 1 tablet by mouth daily.      Yes  Historical Provider, MD   calcium-vitamin D (OSCAL WITH D) 500-200 MG-UNIT per tablet  Take 1 tablet by mouth daily with breakfast.      Yes  Historical Provider, MD   fenofibrate 160 MG tablet  Take 160 mg by mouth daily.      Yes  Historical Provider, MD   levothyroxine (SYNTHROID, LEVOTHROID) 75 MCG tablet  Take 75 mcg by mouth daily before breakfast.      Yes  Historical Provider, MD   Multiple Vitamin (MULTIVITAMIN WITH MINERALS) TABS tablet  Take 1 tablet by mouth daily.      Yes  Historical Provider, MD      Physical Exam: Filed Vitals:     01/06/13 1830  01/06/13 1845  01/06/13 1915  01/06/13 1930  BP:  148/56  136/71  149/57  144/71   Pulse:  72  68  73  70   Temp:           TempSrc:           Resp:  16  17    18    Weight:           SpO2:  98%  97%  97%  98%       General:  Well-developed and nourished. Eyes: Anicteric no pallor. ENT: No discharge from ears eyes nose mouth. Neck: No mass felt. Cardiovascular: S1-S2 heard. Respiratory: No rhonchi or crepitations. Abdomen: Soft nontender bowel sounds present. Skin: No rash. Musculoskeletal: No edema. Psychiatric: Appears normal. Neurologic: Alert awake oriented to time place and person. Moves all extremities.   Labs on Admission:  Basic Metabolic Panel: Recent Labs Lab  01/06/13 1827   NA  139   K  4.1   CL  105   CO2  26   GLUCOSE  113*   BUN  14   CREATININE  0.72   CALCIUM  10.0    Liver Function Tests: No results found for this basename: AST, ALT, ALKPHOS, BILITOT, PROT, ALBUMIN,  in the last 168 hours No results found for this basename: LIPASE, AMYLASE,  in the last 168 hours No results found for this basename: AMMONIA,  in the last 168 hours CBC: Recent Labs Lab  01/06/13 1827   WBC  6.9    HGB  12.8   HCT  38.3   MCV  87.6   PLT  286    Cardiac Enzymes: No results found for this basename: CKTOTAL, CKMB, CKMBINDEX, TROPONINI,  in the last 168 hours   BNP (last 3 results) No results found for this basename: PROBNP,  in the last 8760 hours CBG: No results found for this basename: GLUCAP,  in the last 168 hours   Radiological Exams on Admission: Dg Chest 2 View   01/06/2013   *RADIOLOGY REPORT*  Clinical Data: Chest pain  CHEST - 2 VIEW  Comparison: None.  Findings: The heart and pulmonary vascularity are within normal limits.  The lungs are clear bilaterally.  No acute bony abnormality is seen.  IMPRESSION: No acute abnormality noted.   Original Report Authenticated By: Alcide Clever, M.D.     EKG: Independently reviewed. Normal sinus rhythm with diffuse T-wave changes.   Assessment/Plan Principal Problem:   Chest pain Active Problems:   Hypothyroidism    Chest pain - the chest pain-free. Cycle cardiac markers. Due to EKG changes we will check 2-D echo. Aspirin. Dr. Zoila Shutter (cardiology). Discussed with me that they would like to take the patient onto their service secondary to her issues being primarily cardiac. I concurred Patient is not physically evaluated by [facility]. hospitalist  Hypothyroidism - continue Synthroid.       Code Status: Full code.  Family Communication: Patient's husband at the bedside.  Disposition Plan: Admit for observation.      Carolyne Littles, MD. [facility] Pager [phone].   If 7PM-7AM, please contact night-coverage [email or web address] Password [facility] 01/06/2013, 8:46 PM                 Routing History...     Date/Time From To Method   01/07/2013 1:51 AM Eduard Clos, MD Cala Bradford, MD Fax

## 2013-01-08 ENCOUNTER — Other Ambulatory Visit (HOSPITAL_COMMUNITY): Payer: Medicare Other

## 2013-01-08 ENCOUNTER — Observation Stay (HOSPITAL_COMMUNITY): Payer: Medicare Other

## 2013-01-08 DIAGNOSIS — R079 Chest pain, unspecified: Secondary | ICD-10-CM | POA: Diagnosis not present

## 2013-01-08 LAB — LIPID PANEL: LDL Cholesterol: 90 mg/dL (ref 0–99)

## 2013-01-08 MED ORDER — TECHNETIUM TC 99M SESTAMIBI - CARDIOLITE
10.0000 | Freq: Once | INTRAVENOUS | Status: AC | PRN
  Administered 2013-01-08: 10 via INTRAVENOUS

## 2013-01-08 MED ORDER — TECHNETIUM TC 99M SESTAMIBI - CARDIOLITE
30.0000 | Freq: Once | INTRAVENOUS | Status: AC | PRN
  Administered 2013-01-08: 10:00:00 30 via INTRAVENOUS

## 2013-01-08 NOTE — Progress Notes (Signed)
[facility]  DAILY PROGRESS NOTE   Subjective:  She has had a couple of episodes of retrosternal chest pressure while lying in bed last night. No relationship with exertion.  Objective:  Temp:  [97.7 F (36.5 C)-99.2 F (37.3 C)] 98.2 F (36.8 C) (09/13 0514) Pulse Rate:  [77-87] 85 (09/13 0514) Resp:  [18] 18 (09/13 0514) BP: (136-149)/(55-60) 136/55 mmHg (09/13 0514) SpO2:  [95 %-97 %] 97 % (09/13 0514) Weight change:   Intake/Output from previous day: 09/12 0701 - 09/13 0700 In: 240 [P.O.:240] Out: -   Intake/Output from this shift:    Medications: Current Facility-Administered Medications  Medication Dose Route Frequency Provider Last Rate Last Dose  . acetaminophen (TYLENOL) tablet 650 mg  650 mg Oral Q6H PRN Eduard Clos, MD       Or  . acetaminophen (TYLENOL) suppository 650 mg  650 mg Rectal Q6H PRN Eduard Clos, MD      . aspirin EC tablet 325 mg  325 mg Oral Daily Eduard Clos, MD   325 mg at 01/07/13 1000  . enoxaparin (LOVENOX) injection 40 mg  40 mg Subcutaneous Q24H Eduard Clos, MD      . fenofibrate tablet 160 mg  160 mg Oral Daily Eduard Clos, MD   160 mg at 01/07/13 1004  . levothyroxine (SYNTHROID, LEVOTHROID) tablet 75 mcg  75 mcg Oral QAC breakfast Eduard Clos, MD   75 mcg at 01/08/13 [phone]  . multivitamin with minerals tablet 1 tablet  1 tablet Oral Daily Eduard Clos, MD   1 tablet at 01/07/13 1004  . nitroGLYCERIN (NITROSTAT) SL tablet 0.4 mg  0.4 mg Sublingual Q5 min PRN Eduard Clos, MD      . ondansetron [facility]) tablet 4 mg  4 mg Oral Q6H PRN Eduard Clos, MD       Or  . ondansetron [facility]) injection 4 mg  4 mg Intravenous Q6H PRN Eduard Clos, MD      . sodium chloride 0.9 % injection 3 mL  3 mL Intravenous Q12H Eduard Clos, MD      . sodium chloride 0.9 % injection 3 mL  3 mL Intravenous Q12H Eduard Clos, MD   3 mL at 01/07/13  2151    Physical Exam:  General: Alert, oriented x3, no distress Head: no evidence of trauma, PERRL, EOMI, no exophtalmos or lid lag, no myxedema, no xanthelasma; normal ears, nose and oropharynx Neck: normal jugular venous pulsations and no hepatojugular reflux; brisk carotid pulses without delay and no carotid bruits Chest: clear to auscultation, no signs of consolidation by percussion or palpation, normal fremitus, symmetrical and full respiratory excursions Cardiovascular: normal position and quality of the apical impulse, regular rhythm, normal first and second heart sounds, no murmurs, rubs or gallops Abdomen: no tenderness or distention, no masses by palpation, no abnormal pulsatility or arterial bruits, normal bowel sounds, no hepatosplenomegaly Extremities: no clubbing, cyanosis or edema; 2+ radial, ulnar and brachial pulses bilaterally; 2+ right femoral, posterior tibial and dorsalis pedis pulses; 2+ left femoral, posterior tibial and dorsalis pedis pulses; no subclavian or femoral bruits Neurological: grossly nonfocal   Lab Results: Results for orders placed during the hospital encounter of 01/06/13 (from the past 48 hour(s))  D-DIMER, QUANTITATIVE     Status: None   Collection Time    01/06/13  6:26 PM      Result Value Range   D-Dimer, Quant <0.27  0.00 - 0.48 ug/mL-FEU   Comment:            AT THE INHOUSE ESTABLISHED CUTOFF     VALUE OF 0.48 ug/mL FEU,     THIS ASSAY HAS BEEN DOCUMENTED     IN THE LITERATURE TO HAVE     A SENSITIVITY AND NEGATIVE     PREDICTIVE VALUE OF AT LEAST     98 TO 99%.  THE TEST RESULT     SHOULD BE CORRELATED WITH     AN ASSESSMENT OF THE CLINICAL     PROBABILITY OF DVT / VTE.  CBC     Status: None   Collection Time    01/06/13  6:27 PM      Result Value Range   WBC 6.9  4.0 - 10.5 K/uL   RBC 4.37  3.87 - 5.11 MIL/uL   Hemoglobin 12.8  12.0 - 15.0 g/dL   HCT 40.1  02.7 - 25.3 %   MCV 87.6  78.0 - 100.0 fL   MCH 29.3  26.0 - 34.0 pg    MCHC 33.4  30.0 - 36.0 g/dL   RDW 66.4  40.3 - 47.4 %   Platelets 286  150 - 400 K/uL  BASIC METABOLIC PANEL     Status: Abnormal   Collection Time    01/06/13  6:27 PM      Result Value Range   Sodium 139  135 - 145 mEq/L   Potassium 4.1  3.5 - 5.1 mEq/L   Chloride 105  96 - 112 mEq/L   CO2 26  19 - 32 mEq/L   Glucose, Bld 113 (*) 70 - 99 mg/dL   BUN 14  6 - 23 mg/dL   Creatinine, Ser 2.59  0.50 - 1.10 mg/dL   Calcium 56.3  8.4 - 87.5 mg/dL   GFR calc non Af Amer 85 (*) >90 mL/min   GFR calc Af Amer >90  >90 mL/min   Comment: (NOTE)     The eGFR has been calculated using the CKD EPI equation.     This calculation has not been validated in all clinical situations.     eGFR's persistently <90 mL/min signify possible Chronic Kidney     Disease.  POCT I-STAT TROPONIN I     Status: None   Collection Time    01/06/13  6:49 PM      Result Value Range   Troponin i, poc 0.01  0.00 - 0.08 ng/mL   Comment 3            Comment: Due to the release kinetics of cTnI,     a negative result within the first hours     of the onset of symptoms does not rule out     myocardial infarction with certainty.     If myocardial infarction is still suspected,     repeat the test at appropriate intervals.  TROPONIN I     Status: None   Collection Time    01/06/13  7:38 PM      Result Value Range   Troponin I <0.30  <0.30 ng/mL   Comment:            Due to the release kinetics of cTnI,     a negative result within the first hours     of the onset of symptoms does not rule out     myocardial infarction with certainty.     If myocardial infarction is still  suspected,     repeat the test at appropriate intervals.  TROPONIN I     Status: None   Collection Time    01/07/13  2:19 AM      Result Value Range   Troponin I <0.30  <0.30 ng/mL   Comment:            Due to the release kinetics of cTnI,     a negative result within the first hours     of the onset of symptoms does not rule out      myocardial infarction with certainty.     If myocardial infarction is still suspected,     repeat the test at appropriate intervals.  BASIC METABOLIC PANEL     Status: Abnormal   Collection Time    01/07/13  2:19 AM      Result Value Range   Sodium 137  135 - 145 mEq/L   Potassium 3.8  3.5 - 5.1 mEq/L   Chloride 106  96 - 112 mEq/L   CO2 20  19 - 32 mEq/L   Glucose, Bld 147 (*) 70 - 99 mg/dL   BUN 14  6 - 23 mg/dL   Creatinine, Ser 1.61  0.50 - 1.10 mg/dL   Calcium 9.7  8.4 - 09.6 mg/dL   GFR calc non Af Amer 85 (*) >90 mL/min   GFR calc Af Amer >90  >90 mL/min   Comment: (NOTE)     The eGFR has been calculated using the CKD EPI equation.     This calculation has not been validated in all clinical situations.     eGFR's persistently <90 mL/min signify possible Chronic Kidney     Disease.  CBC     Status: None   Collection Time    01/07/13  2:19 AM      Result Value Range   WBC 7.1  4.0 - 10.5 K/uL   RBC 4.34  3.87 - 5.11 MIL/uL   Hemoglobin 12.7  12.0 - 15.0 g/dL   HCT 04.5  40.9 - 81.1 %   MCV 86.9  78.0 - 100.0 fL   MCH 29.3  26.0 - 34.0 pg   MCHC 33.7  30.0 - 36.0 g/dL   RDW 91.4  78.2 - 95.6 %   Platelets 264  150 - 400 K/uL  TROPONIN I     Status: None   Collection Time    01/07/13 10:40 AM      Result Value Range   Troponin I <0.30  <0.30 ng/mL   Comment:            Due to the release kinetics of cTnI,     a negative result within the first hours     of the onset of symptoms does not rule out     myocardial infarction with certainty.     If myocardial infarction is still suspected,     repeat the test at appropriate intervals.  LIPID PANEL     Status: Abnormal   Collection Time    01/08/13 12:17 AM      Result Value Range   Cholesterol 171  0 - 200 mg/dL   Triglycerides 213 (*) <150 mg/dL   HDL 38 (*) >08 mg/dL   Total CHOL/HDL Ratio 4.5     VLDL 43 (*) 0 - 40 mg/dL   LDL Cholesterol 90  0 - 99 mg/dL   Comment:  Total Cholesterol/HDL:CHD Risk      Coronary Heart Disease Risk Table                         Men   Women      1/2 Average Risk   3.4   3.3      Average Risk       5.0   4.4      2 X Average Risk   9.6   7.1      3 X Average Risk  23.4   11.0                Use the calculated Patient Ratio     above and the CHD Risk Table     to determine the patient's CHD Risk.                ATP III CLASSIFICATION (LDL):      <100     mg/dL   Optimal      161-096  mg/dL   Near or Above                        Optimal      130-159  mg/dL   Borderline      045-409  mg/dL   High      >811     mg/dL   Very High    Imaging: Dg Chest 2 View  01/06/2013   *RADIOLOGY REPORT*  Clinical Data: Chest pain  CHEST - 2 VIEW  Comparison: None.  Findings: The heart and pulmonary vascularity are within normal limits.  The lungs are clear bilaterally.  No acute bony abnormality is seen.  IMPRESSION: No acute abnormality noted.   Original Report Authenticated By: Alcide Clever, M.D.    Assessment:  1. Principal Problem: 2.   Chest pain 3. Active Problems: 4.   Hypothyroidism 5.   Plan:  1. Proceed with treadmill Myoview. If knee arthritis interferes with ability to exercise may have to switch to vasodilator stress. 2. Favorable LDL-C level, but low HDL and high TG suggest metabolic sd./small-dense LDL and probably high LDL particle number. Already on fenofibrate, but if LDL particle number is high may still benefit from statin.  Time Spent Directly with Patient:  35 minutes  Length of Stay:  LOS: 2 days    Kingsley Farace 01/08/2013, 9:37 AM
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
## 2013-02-14 ENCOUNTER — Encounter: Payer: Self-pay | Admitting: Cardiovascular Disease

## 2013-02-14 ENCOUNTER — Ambulatory Visit (INDEPENDENT_AMBULATORY_CARE_PROVIDER_SITE_OTHER): Payer: Medicare Other | Admitting: Cardiovascular Disease

## 2013-02-14 VITALS — BP 158/90 | HR 80 | Ht 65.0 in | Wt 208.4 lb

## 2013-02-14 DIAGNOSIS — R079 Chest pain, unspecified: Secondary | ICD-10-CM

## 2013-02-14 DIAGNOSIS — E781 Pure hyperglyceridemia: Secondary | ICD-10-CM | POA: Insufficient documentation

## 2013-02-14 DIAGNOSIS — E785 Hyperlipidemia, unspecified: Secondary | ICD-10-CM | POA: Diagnosis not present

## 2013-02-14 NOTE — Assessment & Plan Note (Signed)
She is on fenofibrate. Her lipid profile performed 01/08/13 revealed a total cholesterol 171, LDL 90 HDL of 30

## 2013-02-14 NOTE — Assessment & Plan Note (Signed)
The patient was seen at [facility] emergency room by Dr. Royann Shivers. She ruled out for myocardial infarction. An exercise Myoview stress test was completely normal with an ejection fraction of 61%. She's had no recurrent episodes of chest pain.
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
## 2013-02-14 NOTE — Patient Instructions (Signed)
Your physician wants you to follow-up in: 1 year with Dr Berry. You will receive a reminder letter in the mail two months in advance. If you don't receive a letter, please call our office to schedule the follow-up appointment.  

## 2013-03-15 ENCOUNTER — Emergency Department (HOSPITAL_COMMUNITY)
Admission: EM | Admit: 2013-03-15 | Discharge: 2013-03-15 | Disposition: A | Discharge status: Home / Self Care | Payer: Medicare Other | Attending: Emergency Medicine | Admitting: Emergency Medicine

## 2013-03-15 ENCOUNTER — Encounter (HOSPITAL_COMMUNITY): Payer: Self-pay | Admitting: Emergency Medicine

## 2013-03-15 ENCOUNTER — Emergency Department (HOSPITAL_COMMUNITY): Payer: Medicare Other

## 2013-03-15 DIAGNOSIS — Z79899 Other long term (current) drug therapy: Secondary | ICD-10-CM | POA: Diagnosis not present

## 2013-03-15 DIAGNOSIS — Z8659 Personal history of other mental and behavioral disorders: Secondary | ICD-10-CM | POA: Diagnosis not present

## 2013-03-15 DIAGNOSIS — E039 Hypothyroidism, unspecified: Secondary | ICD-10-CM | POA: Insufficient documentation

## 2013-03-15 DIAGNOSIS — Z91040 Latex allergy status: Secondary | ICD-10-CM | POA: Diagnosis not present

## 2013-03-15 DIAGNOSIS — Z95818 Presence of other cardiac implants and grafts: Secondary | ICD-10-CM | POA: Insufficient documentation

## 2013-03-15 DIAGNOSIS — R52 Pain, unspecified: Secondary | ICD-10-CM | POA: Diagnosis not present

## 2013-03-15 DIAGNOSIS — M129 Arthropathy, unspecified: Secondary | ICD-10-CM | POA: Diagnosis not present

## 2013-03-15 DIAGNOSIS — M47817 Spondylosis without myelopathy or radiculopathy, lumbosacral region: Secondary | ICD-10-CM | POA: Diagnosis not present

## 2013-03-15 DIAGNOSIS — Z860100 Personal history of colon polyps, unspecified: Secondary | ICD-10-CM | POA: Insufficient documentation

## 2013-03-15 DIAGNOSIS — M545 Low back pain, unspecified: Secondary | ICD-10-CM | POA: Diagnosis not present

## 2013-03-15 DIAGNOSIS — E785 Hyperlipidemia, unspecified: Secondary | ICD-10-CM | POA: Insufficient documentation

## 2013-03-15 DIAGNOSIS — Z7982 Long term (current) use of aspirin: Secondary | ICD-10-CM | POA: Insufficient documentation

## 2013-03-15 MED ORDER — MORPHINE SULFATE 4 MG/ML IJ SOLN
4.0000 mg | Freq: Once | INTRAMUSCULAR | Status: AC
  Administered 2013-03-15: 4 mg via INTRAMUSCULAR
  Filled 2013-03-15: qty 1

## 2013-03-15 MED ORDER — PREDNISONE 20 MG PO TABS: ORAL_TABLET | ORAL | Status: DC

## 2013-03-15 MED ORDER — CYCLOBENZAPRINE HCL 5 MG PO TABS: 5.0000 mg | ORAL_TABLET | Freq: Three times a day (TID) | ORAL | Status: DC | PRN

## 2013-03-15 MED ORDER — DIAZEPAM 5 MG PO TABS: 5.0000 mg | ORAL_TABLET | Freq: Three times a day (TID) | ORAL | Status: DC | PRN

## 2013-03-15 MED ORDER — TRAMADOL HCL 50 MG PO TABS: 100.0000 mg | ORAL_TABLET | Freq: Four times a day (QID) | ORAL | Status: DC | PRN

## 2013-03-15 MED ORDER — DEXAMETHASONE SODIUM PHOSPHATE 4 MG/ML IJ SOLN
10.0000 mg | Freq: Once | INTRAMUSCULAR | Status: AC
  Administered 2013-03-15: 10 mg via INTRAMUSCULAR
  Filled 2013-03-15: qty 3

## 2013-03-15 MED ORDER — DIAZEPAM 5 MG/ML IJ SOLN
5.0000 mg | Freq: Once | INTRAMUSCULAR | Status: AC
  Administered 2013-03-15: 5 mg via INTRAMUSCULAR
  Filled 2013-03-15: qty 2

## 2013-03-15 NOTE — ED Provider Notes (Signed)
CSN: [id number]     Arrival date & time 03/15/13  1056 History   First MD Initiated Contact with Patient 03/15/13 1151     Chief Complaint  Patient presents with  . Back Pain   (Consider location/radiation/quality/duration/timing/severity/associated sxs/prior Treatment) HPI  Patient states in 1981 she was admitted to the hospital for couple of weeks when her back "went out". Said she couldn't bend over or move because of pain. She reports since then she is having some back discomfort off and on. She states yesterday she was lifting a 40 pound bag of dog food and started getting some discomfort in her lower back. She states she doesn't normally left up dog  food. She states initially the pain was mild however during the night the pain started getting worse. She took 2 Naprosyn last night that she normally takes for her knees. She reports this morning her pain is sharp with pressure and a burning sensation in her lower sacral area. She took another 2 naprosyn this morning without relief. She denies any radiation of pain down her legs. She denies any numbness in her extremities. She denies any rectal or urinary incontinence. She states movement makes the pain worse, lying still makes it is up a little. She states she had a MRI in 1981 and she had a bulging disc at that time.  PCP Dr Esmond Harps  Past Medical History  Diagnosis Date  . PTSD (post-traumatic stress disorder)   . Arthritis   . Thyroid disease   . Adenomatous colon polyp   . Hypothyroidism   . Hyperlipidemia    Past Surgical History  Procedure Laterality Date  . Cardiac catheterization    . Knee surgery     Family History  Problem Relation Age of Onset  . Stroke Mother   . Stroke Sister   . CAD Brother    History  Substance Use Topics  . Smoking status: Never Smoker   . Smokeless tobacco: Never Used  . Alcohol Use: No  lives at home Lives with spouse   OB History   Grav Para Term Preterm Abortions TAB SAB Ect Mult  Living                 Review of Systems  All other systems reviewed and are negative.    Allergies  Shellfish allergy; Latex; Nickel; and Other  Home Medications   Current Outpatient Rx  Name  Route  Sig  Dispense  Refill  . aspirin EC 81 MG tablet   Oral   Take 81 mg by mouth daily.         Marland Kitchen b complex vitamins tablet   Oral   Take 1 tablet by mouth daily.         . calcium-vitamin D (OSCAL WITH D) 500-200 MG-UNIT per tablet   Oral   Take 1 tablet by mouth daily with breakfast.         . fenofibrate 160 MG tablet   Oral   Take 160 mg by mouth daily.         Marland Kitchen levothyroxine (SYNTHROID, LEVOTHROID) 75 MCG tablet   Oral   Take 75 mcg by mouth daily before breakfast.         . Multiple Vitamin (MULTIVITAMIN WITH MINERALS) TABS tablet   Oral   Take 1 tablet by mouth daily.         . naproxen sodium (ANAPROX) 220 MG tablet   Oral   Take 440 mg  by mouth daily as needed (pain).          BP 150/78  Pulse 74  Temp(Src) 98.5 F (36.9 C) (Oral)  Resp 18  SpO2 98%  Vital signs normal   Physical Exam  Nursing note and vitals reviewed. Constitutional: She is oriented to person, place, and time. She appears well-developed and well-nourished.  Non-toxic appearance. She does not appear ill. No distress.  HENT:  Head: Normocephalic and atraumatic.  Right Ear: External ear normal.  Left Ear: External ear normal.  Nose: Nose normal. No mucosal edema or rhinorrhea.  Mouth/Throat: Oropharynx is clear and moist and mucous membranes are normal. No dental abscesses or uvula swelling.  Eyes: Conjunctivae and EOM are normal. Pupils are equal, round, and reactive to light.  Neck: Normal range of motion and full passive range of motion without pain. Neck supple.  Cardiovascular: Normal rate, regular rhythm and normal heart sounds.  Exam reveals no gallop and no friction rub.   No murmur heard. Pulmonary/Chest: Effort normal and breath sounds normal. No respiratory  distress. She has no wheezes. She has no rhonchi. She has no rales. She exhibits no tenderness and no crepitus.  Abdominal: Soft. Normal appearance and bowel sounds are normal. She exhibits no distension. There is no tenderness. There is no rebound and no guarding.  Musculoskeletal: Normal range of motion. She exhibits no edema and no tenderness.       Back:  Pt is painful when she attempts to roll over to have her pain examined and when she rolled back over. No sciatic notch pain.   Neurological: She is alert and oriented to person, place, and time. She has normal strength. No cranial nerve deficit.  Skin: Skin is warm, dry and intact. No rash noted. No erythema. No pallor.  Psychiatric: She has a normal mood and affect. Her speech is normal and behavior is normal. Her mood appears not anxious.    ED Course  Procedures (including critical care time)  Medications  dexamethasone (DECADRON) injection 10 mg (10 mg Intramuscular Given 03/15/13 1434)  diazepam (VALIUM) injection 5 mg (5 mg Intramuscular Given 03/15/13 1437)  morphine 4 MG/ML injection 4 mg (4 mg Intramuscular Given 03/15/13 1436)    Labs Review Labs Reviewed - No data to display Imaging Review Dg Lumbar Spine Complete  03/15/2013   CLINICAL DATA:  Pain.  EXAM: LUMBAR SPINE - COMPLETE 4+ VIEW  COMPARISON:  None.  FINDINGS: Diffuse degenerative changes of the lumbar spine. Pedicles are intact. No acute bony abnormality identified. No evidence of fracture. No evidence of spondylolisthesis. Aortoiliac atherosclerotic vascular disease.  IMPRESSION: Diffuse degenerative disease.  No acute abnormality.   Electronically Signed   By: Maisie Fus  Register   On: 03/15/2013 13:15    EKG Interpretation   None       MDM   1. Acute low back pain     New Prescriptions   CYCLOBENZAPRINE (FLEXERIL) 5 MG TABLET    Take 1 tablet (5 mg total) by mouth 3 (three) times daily as needed for muscle spasms (after the valium is gone).   DIAZEPAM  (VALIUM) 5 MG TABLET    Take 1 tablet (5 mg total) by mouth every 8 (eight) hours as needed for muscle spasms.   PREDNISONE (DELTASONE) 20 MG TABLET    Take 3 po QD x 2d starting tomorrow, then 2 po QD x 3d then 1 po QD x 3d   TRAMADOL (ULTRAM) 50 MG TABLET    Take  2 tablets (100 mg total) by mouth every 6 (six) hours as needed.    Plan discharge   Devoria Albe, MD, Franz Dell, MD 03/15/13 [phone]

## 2013-03-15 NOTE — ED Notes (Signed)
MD at bedside. EDP Devoria Albe present evaluating pt

## 2013-03-15 NOTE — ED Notes (Signed)
Patient with lower back pain which started last evening.  She took 2 aleve with no relief, then took two more this AM.  Patient has history of bulging discs but has not had surgery.  Rates pain a 10/10.

## 2013-03-23 DIAGNOSIS — M519 Unspecified thoracic, thoracolumbar and lumbosacral intervertebral disc disorder: Secondary | ICD-10-CM | POA: Diagnosis not present

## 2013-03-23 DIAGNOSIS — E039 Hypothyroidism, unspecified: Secondary | ICD-10-CM | POA: Diagnosis not present

## 2013-03-23 DIAGNOSIS — E785 Hyperlipidemia, unspecified: Secondary | ICD-10-CM | POA: Diagnosis not present

## 2013-07-22 DIAGNOSIS — M519 Unspecified thoracic, thoracolumbar and lumbosacral intervertebral disc disorder: Secondary | ICD-10-CM | POA: Diagnosis not present

## 2013-08-01 ENCOUNTER — Other Ambulatory Visit: Payer: Self-pay | Admitting: Family Medicine

## 2013-08-01 DIAGNOSIS — M5432 Sciatica, left side: Secondary | ICD-10-CM

## 2013-08-01 DIAGNOSIS — M545 Low back pain, unspecified: Secondary | ICD-10-CM

## 2013-08-03 DIAGNOSIS — J189 Pneumonia, unspecified organism: Secondary | ICD-10-CM | POA: Diagnosis not present

## 2013-08-07 ENCOUNTER — Ambulatory Visit
Admission: RE | Admit: 2013-08-07 | Discharge: 2013-08-07 | Disposition: A | Discharge status: Home / Self Care | Payer: Medicare Other | Source: Ambulatory Visit | Attending: Family Medicine | Admitting: Family Medicine

## 2013-08-07 DIAGNOSIS — M545 Low back pain, unspecified: Secondary | ICD-10-CM

## 2013-08-07 DIAGNOSIS — M5137 Other intervertebral disc degeneration, lumbosacral region: Secondary | ICD-10-CM | POA: Diagnosis not present

## 2013-08-07 DIAGNOSIS — M5432 Sciatica, left side: Secondary | ICD-10-CM

## 2013-08-08 DIAGNOSIS — J209 Acute bronchitis, unspecified: Secondary | ICD-10-CM | POA: Diagnosis not present

## 2013-08-11 DIAGNOSIS — M940 Chondrocostal junction syndrome [Tietze]: Secondary | ICD-10-CM | POA: Diagnosis not present

## 2013-08-24 DIAGNOSIS — M549 Dorsalgia, unspecified: Secondary | ICD-10-CM | POA: Diagnosis not present

## 2013-08-24 DIAGNOSIS — R079 Chest pain, unspecified: Secondary | ICD-10-CM | POA: Diagnosis not present

## 2013-08-25 ENCOUNTER — Ambulatory Visit
Admission: RE | Admit: 2013-08-25 | Discharge: 2013-08-25 | Disposition: A | Discharge status: Home / Self Care | Payer: Medicare Other | Source: Ambulatory Visit | Attending: Physician Assistant | Admitting: Physician Assistant

## 2013-08-25 ENCOUNTER — Other Ambulatory Visit: Payer: Self-pay | Admitting: Physician Assistant

## 2013-08-25 DIAGNOSIS — R059 Cough, unspecified: Secondary | ICD-10-CM

## 2013-08-25 DIAGNOSIS — R05 Cough: Secondary | ICD-10-CM

## 2013-08-25 DIAGNOSIS — J988 Other specified respiratory disorders: Secondary | ICD-10-CM | POA: Diagnosis not present

## 2013-08-25 DIAGNOSIS — R0781 Pleurodynia: Secondary | ICD-10-CM

## 2013-08-25 DIAGNOSIS — J398 Other specified diseases of upper respiratory tract: Secondary | ICD-10-CM | POA: Diagnosis not present

## 2013-08-25 DIAGNOSIS — S2249XA Multiple fractures of ribs, unspecified side, initial encounter for closed fracture: Secondary | ICD-10-CM | POA: Diagnosis not present

## 2013-09-05 DIAGNOSIS — S2239XA Fracture of one rib, unspecified side, initial encounter for closed fracture: Secondary | ICD-10-CM | POA: Diagnosis not present

## 2013-09-05 DIAGNOSIS — E039 Hypothyroidism, unspecified: Secondary | ICD-10-CM | POA: Diagnosis not present

## 2013-09-05 DIAGNOSIS — R5383 Other fatigue: Secondary | ICD-10-CM | POA: Diagnosis not present

## 2013-09-05 DIAGNOSIS — G4730 Sleep apnea, unspecified: Secondary | ICD-10-CM | POA: Diagnosis not present

## 2013-09-05 DIAGNOSIS — R5381 Other malaise: Secondary | ICD-10-CM | POA: Diagnosis not present

## 2013-09-05 DIAGNOSIS — E785 Hyperlipidemia, unspecified: Secondary | ICD-10-CM | POA: Diagnosis not present

## 2013-09-05 DIAGNOSIS — Z1331 Encounter for screening for depression: Secondary | ICD-10-CM | POA: Diagnosis not present

## 2013-09-05 DIAGNOSIS — F411 Generalized anxiety disorder: Secondary | ICD-10-CM | POA: Diagnosis not present

## 2013-09-06 DIAGNOSIS — Z6835 Body mass index (BMI) 35.0-35.9, adult: Secondary | ICD-10-CM | POA: Diagnosis not present

## 2013-09-06 DIAGNOSIS — M549 Dorsalgia, unspecified: Secondary | ICD-10-CM | POA: Diagnosis not present

## 2013-09-06 DIAGNOSIS — R030 Elevated blood-pressure reading, without diagnosis of hypertension: Secondary | ICD-10-CM | POA: Diagnosis not present

## 2013-12-16 DIAGNOSIS — E785 Hyperlipidemia, unspecified: Secondary | ICD-10-CM | POA: Diagnosis not present

## 2014-02-21 DIAGNOSIS — G4733 Obstructive sleep apnea (adult) (pediatric): Secondary | ICD-10-CM | POA: Diagnosis not present

## 2014-04-08 DIAGNOSIS — Z23 Encounter for immunization: Secondary | ICD-10-CM | POA: Diagnosis not present

## 2014-05-10 DIAGNOSIS — G4733 Obstructive sleep apnea (adult) (pediatric): Secondary | ICD-10-CM | POA: Diagnosis not present

## 2014-05-10 DIAGNOSIS — R030 Elevated blood-pressure reading, without diagnosis of hypertension: Secondary | ICD-10-CM | POA: Diagnosis not present

## 2014-05-16 DIAGNOSIS — Z23 Encounter for immunization: Secondary | ICD-10-CM | POA: Diagnosis not present

## 2014-05-16 DIAGNOSIS — G5602 Carpal tunnel syndrome, left upper limb: Secondary | ICD-10-CM | POA: Diagnosis not present

## 2014-05-16 DIAGNOSIS — R030 Elevated blood-pressure reading, without diagnosis of hypertension: Secondary | ICD-10-CM | POA: Diagnosis not present

## 2014-05-16 DIAGNOSIS — G5601 Carpal tunnel syndrome, right upper limb: Secondary | ICD-10-CM | POA: Diagnosis not present

## 2014-06-15 DIAGNOSIS — L03011 Cellulitis of right finger: Secondary | ICD-10-CM | POA: Diagnosis not present

## 2014-06-20 DIAGNOSIS — M79641 Pain in right hand: Secondary | ICD-10-CM | POA: Diagnosis not present

## 2014-06-20 DIAGNOSIS — M79642 Pain in left hand: Secondary | ICD-10-CM | POA: Diagnosis not present

## 2014-06-20 DIAGNOSIS — L03011 Cellulitis of right finger: Secondary | ICD-10-CM | POA: Diagnosis not present

## 2014-07-12 DIAGNOSIS — H903 Sensorineural hearing loss, bilateral: Secondary | ICD-10-CM | POA: Diagnosis not present

## 2014-07-12 DIAGNOSIS — H9313 Tinnitus, bilateral: Secondary | ICD-10-CM | POA: Diagnosis not present

## 2014-07-27 ENCOUNTER — Other Ambulatory Visit: Payer: Self-pay

## 2014-07-27 DIAGNOSIS — Z1231 Encounter for screening mammogram for malignant neoplasm of breast: Secondary | ICD-10-CM

## 2014-08-10 DIAGNOSIS — Z961 Presence of intraocular lens: Secondary | ICD-10-CM | POA: Diagnosis not present

## 2014-08-15 ENCOUNTER — Ambulatory Visit
Admission: RE | Admit: 2014-08-15 | Discharge: 2014-08-15 | Disposition: A | Discharge status: Home / Self Care | Payer: Medicare Other | Source: Ambulatory Visit

## 2014-08-15 ENCOUNTER — Other Ambulatory Visit: Payer: Self-pay

## 2014-08-15 DIAGNOSIS — Z1231 Encounter for screening mammogram for malignant neoplasm of breast: Secondary | ICD-10-CM

## 2014-08-15 NOTE — Op Note (Signed)
PATIENT NAME:  Cheryl Velez, Cheryl Velez MR#:  [id number] DATE OF BIRTH:  Aug 05, 1940  DATE OF PROCEDURE:  03/29/2012  PREOPERATIVE DIAGNOSIS:  Cataract, right eye.   POSTOPERATIVE DIAGNOSIS:  Cataract, right eye.  PROCEDURE PERFORMED:  Extracapsular cataract extraction using phacoemulsification with placement of an Alcon SN6CWS, 22.0-diopter posterior chamber lens, serial #[id number].  SURGEON:  Loura Back. Darcy Barbara, MD  ASSISTANT:  None.  ANESTHESIA:  4% lidocaine and 0.75% Marcaine in a 50/50 mixture with 10 units/mL of Hylenex added, given as a peribulbar.   ANESTHESIOLOGIST:  Julianne Handler, MD  COMPLICATIONS:  None.  ESTIMATED BLOOD LOSS:  Less than 1 ml.  DESCRIPTION OF PROCEDURE:  The patient was brought to the operating room and given a peribulbar block.  The patient was then prepped and draped in the usual fashion.  The vertical rectus muscles were imbricated using 5-0 silk sutures.  These sutures were then clamped to the sterile drapes as bridle sutures.  A limbal peritomy was performed extending two clock hours and hemostasis was obtained with cautery.  A partial thickness scleral groove was made at the surgical limbus and dissected anteriorly in a lamellar dissection using an Alcon crescent knife.  The anterior chamber was entered superonasally with a Superblade and through the lamellar dissection with a 2.6 mm keratome.  DisCoVisc was used to replace the aqueous and a continuous tear capsulorrhexis was carried out.  Hydrodissection and hydrodelineation were carried out with balanced salt and a 27 gauge canula.  The nucleus was rotated to confirm the effectiveness of the hydrodissection.  Phacoemulsification was carried out using a divide-and-conquer technique.  Total ultrasound time was 1 minute and 43.6 seconds with an average power of 22.8 percent and CDE of 39.67.  Irrigation/aspiration was used to remove the residual cortex.  DisCoVisc was used to inflate the capsule and the  internal incision was enlarged to 3 mm with the crescent knife.  The intraocular lens was folded and inserted into the capsular bag using the AcrySert delivery system. Irrigation/aspiration was used to remove the residual DisCoVisc.  Miostat was injected into the anterior chamber through the paracentesis track to inflate the anterior chamber and induce miosis.  The wound was checked for leaks and none were found. The conjunctiva was closed with cautery and the bridle sutures were removed.  Two drops of 0.3% Vigamox were placed on the eye.   An eye shield was placed on the eye.  The patient was discharged to the recovery room in good condition. ____________________________ Loura Back Ricarda Atayde, MD sad:slb D: 03/29/2012 13:05:21 ET T: 03/29/2012 13:09:43 ET JOB#: [id number]  cc: Remo Lipps A. Charlisa Cham, MD, <Dictator> Martie Lee MD ELECTRONICALLY SIGNED 04/05/2012 12:53

## 2014-08-15 NOTE — Op Note (Signed)
PATIENT NAME:  Cheryl Velez, Cheryl Velez MR#:  [id number] DATE OF BIRTH:  [birth year]  DATE OF PROCEDURE:  04/19/2012  PREOPERATIVE DIAGNOSIS:  Cataract, left eye.    POSTOPERATIVE DIAGNOSIS:  Cataract, left eye.  PROCEDURE PERFORMED:  Extracapsular cataract extraction using phacoemulsification with placement of an Alcon SN6CWS, 22.5-diopter posterior chamber lens, serial #[id number].  SURGEON:  Loura Back. Eryca Bolte, MD  ASSISTANT:  None.  ANESTHESIA:  4% lidocaine and 0.75% Marcaine in a 50/50 mixture with 10 units/mL of Hylenex added, given as a peribulbar.   ANESTHESIOLOGIST:  Dr. Benjamine Mola  COMPLICATIONS:  None.  ESTIMATED BLOOD LOSS:  Less than 1 ml.  DESCRIPTION OF PROCEDURE:  The patient was brought to the operating room and given a peribulbar block.  The patient was then prepped and draped in the usual fashion.  The vertical rectus muscles were imbricated using 5-0 silk sutures.  These sutures were then clamped to the sterile drapes as bridle sutures.  A limbal peritomy was performed extending two clock hours and hemostasis was obtained with cautery.  A partial thickness scleral groove was made at the surgical limbus and dissected anteriorly in a lamellar dissection using an Alcon crescent knife.  The anterior chamber was entered supero-temporally with a Superblade and through the lamellar dissection with a 2.6 mm keratome.  DisCoVisc was used to replace the aqueous and a continuous tear capsulorrhexis was carried out.  Hydrodissection and hydrodelineation were carried out with balanced salt and a 27 gauge canula.  The nucleus was rotated to confirm the effectiveness of the hydrodissection.  Phacoemulsification was carried out using a divide-and-conquer technique.  Total ultrasound time was 3 minutes and 3 seconds with an average power of 23.5 percent for CDE 73.01.  Irrigation/aspiration was used to remove the residual cortex.  DisCoVisc was used to inflate the capsule and the internal incision was  enlarged to 3 mm with the crescent knife.  The intraocular lens was folded and inserted into the capsular bag using the AcrySert delivery system. Irrigation/aspiration was used to remove the residual DisCoVisc.  Miostat was injected into the anterior chamber through the paracentesis track to inflate the anterior chamber and induce miosis.  The wound was checked for leaks and none were found. The conjunctiva was closed with cautery and the bridle sutures were removed.  Two drops of 0.3% Vigamox were placed on the eye.   An eye shield was placed on the eye.  The patient was discharged to the recovery room in good condition. ____________________________ Loura Back Dejuan Elman, MD sad:sb D: 04/19/2012 12:48:00 ET T: 04/19/2012 13:38:54 ET JOB#: [id number]  cc: Remo Lipps A. Dahlton Hinde, MD, <Dictator> Martie Lee MD ELECTRONICALLY SIGNED 04/26/2012 13:23

## 2014-08-21 DIAGNOSIS — M179 Osteoarthritis of knee, unspecified: Secondary | ICD-10-CM | POA: Diagnosis not present

## 2014-08-21 DIAGNOSIS — M25562 Pain in left knee: Secondary | ICD-10-CM | POA: Diagnosis not present

## 2014-08-21 DIAGNOSIS — M7062 Trochanteric bursitis, left hip: Secondary | ICD-10-CM | POA: Diagnosis not present

## 2014-08-21 DIAGNOSIS — M79604 Pain in right leg: Secondary | ICD-10-CM | POA: Diagnosis not present

## 2014-08-25 DIAGNOSIS — Z96652 Presence of left artificial knee joint: Secondary | ICD-10-CM | POA: Diagnosis not present

## 2014-08-25 DIAGNOSIS — Z09 Encounter for follow-up examination after completed treatment for conditions other than malignant neoplasm: Secondary | ICD-10-CM | POA: Diagnosis not present

## 2014-08-25 DIAGNOSIS — M5136 Other intervertebral disc degeneration, lumbar region: Secondary | ICD-10-CM | POA: Diagnosis not present

## 2014-08-25 DIAGNOSIS — M1711 Unilateral primary osteoarthritis, right knee: Secondary | ICD-10-CM | POA: Diagnosis not present

## 2014-09-01 DIAGNOSIS — M545 Low back pain: Secondary | ICD-10-CM | POA: Diagnosis not present

## 2014-09-01 DIAGNOSIS — M47896 Other spondylosis, lumbar region: Secondary | ICD-10-CM | POA: Diagnosis not present

## 2014-09-05 DIAGNOSIS — M47896 Other spondylosis, lumbar region: Secondary | ICD-10-CM | POA: Diagnosis not present

## 2014-09-05 DIAGNOSIS — M545 Low back pain: Secondary | ICD-10-CM | POA: Diagnosis not present

## 2014-09-11 DIAGNOSIS — M47896 Other spondylosis, lumbar region: Secondary | ICD-10-CM | POA: Diagnosis not present

## 2014-09-11 DIAGNOSIS — M545 Low back pain: Secondary | ICD-10-CM | POA: Diagnosis not present

## 2014-09-13 DIAGNOSIS — M545 Low back pain: Secondary | ICD-10-CM | POA: Diagnosis not present

## 2014-09-13 DIAGNOSIS — M47896 Other spondylosis, lumbar region: Secondary | ICD-10-CM | POA: Diagnosis not present

## 2014-09-18 DIAGNOSIS — M545 Low back pain: Secondary | ICD-10-CM | POA: Diagnosis not present

## 2014-09-18 DIAGNOSIS — M47896 Other spondylosis, lumbar region: Secondary | ICD-10-CM | POA: Diagnosis not present

## 2014-09-20 DIAGNOSIS — M545 Low back pain: Secondary | ICD-10-CM | POA: Diagnosis not present

## 2014-09-20 DIAGNOSIS — M47896 Other spondylosis, lumbar region: Secondary | ICD-10-CM | POA: Diagnosis not present

## 2014-09-26 DIAGNOSIS — M47896 Other spondylosis, lumbar region: Secondary | ICD-10-CM | POA: Diagnosis not present

## 2014-09-26 DIAGNOSIS — M545 Low back pain: Secondary | ICD-10-CM | POA: Diagnosis not present

## 2014-10-02 DIAGNOSIS — M47896 Other spondylosis, lumbar region: Secondary | ICD-10-CM | POA: Diagnosis not present

## 2014-10-02 DIAGNOSIS — M545 Low back pain: Secondary | ICD-10-CM | POA: Diagnosis not present

## 2014-10-04 DIAGNOSIS — M7052 Other bursitis of knee, left knee: Secondary | ICD-10-CM | POA: Diagnosis not present

## 2014-10-04 DIAGNOSIS — M5136 Other intervertebral disc degeneration, lumbar region: Secondary | ICD-10-CM | POA: Diagnosis not present

## 2014-10-09 DIAGNOSIS — M47896 Other spondylosis, lumbar region: Secondary | ICD-10-CM | POA: Diagnosis not present

## 2014-10-09 DIAGNOSIS — M545 Low back pain: Secondary | ICD-10-CM | POA: Diagnosis not present

## 2014-10-11 DIAGNOSIS — Z6835 Body mass index (BMI) 35.0-35.9, adult: Secondary | ICD-10-CM | POA: Diagnosis not present

## 2014-10-11 DIAGNOSIS — Z0000 Encounter for general adult medical examination without abnormal findings: Secondary | ICD-10-CM | POA: Diagnosis not present

## 2014-10-11 DIAGNOSIS — E6609 Other obesity due to excess calories: Secondary | ICD-10-CM | POA: Diagnosis not present

## 2014-10-11 DIAGNOSIS — E039 Hypothyroidism, unspecified: Secondary | ICD-10-CM | POA: Diagnosis not present

## 2014-10-11 DIAGNOSIS — N951 Menopausal and female climacteric states: Secondary | ICD-10-CM | POA: Diagnosis not present

## 2014-10-11 DIAGNOSIS — E785 Hyperlipidemia, unspecified: Secondary | ICD-10-CM | POA: Diagnosis not present

## 2014-10-12 DIAGNOSIS — M47896 Other spondylosis, lumbar region: Secondary | ICD-10-CM | POA: Diagnosis not present

## 2014-10-12 DIAGNOSIS — M545 Low back pain: Secondary | ICD-10-CM | POA: Diagnosis not present

## 2014-10-16 DIAGNOSIS — M545 Low back pain: Secondary | ICD-10-CM | POA: Diagnosis not present

## 2014-10-16 DIAGNOSIS — M47896 Other spondylosis, lumbar region: Secondary | ICD-10-CM | POA: Diagnosis not present

## 2014-10-19 DIAGNOSIS — M47896 Other spondylosis, lumbar region: Secondary | ICD-10-CM | POA: Diagnosis not present

## 2014-10-19 DIAGNOSIS — M545 Low back pain: Secondary | ICD-10-CM | POA: Diagnosis not present

## 2014-10-26 DIAGNOSIS — M545 Low back pain: Secondary | ICD-10-CM | POA: Diagnosis not present

## 2014-10-26 DIAGNOSIS — M47896 Other spondylosis, lumbar region: Secondary | ICD-10-CM | POA: Diagnosis not present

## 2014-11-01 DIAGNOSIS — M7052 Other bursitis of knee, left knee: Secondary | ICD-10-CM | POA: Diagnosis not present

## 2014-11-28 DIAGNOSIS — Z6836 Body mass index (BMI) 36.0-36.9, adult: Secondary | ICD-10-CM | POA: Diagnosis not present

## 2014-11-28 DIAGNOSIS — H6121 Impacted cerumen, right ear: Secondary | ICD-10-CM | POA: Diagnosis not present

## 2014-11-28 DIAGNOSIS — E6609 Other obesity due to excess calories: Secondary | ICD-10-CM | POA: Diagnosis not present

## 2014-11-29 DIAGNOSIS — M7052 Other bursitis of knee, left knee: Secondary | ICD-10-CM | POA: Diagnosis not present

## 2014-12-04 DIAGNOSIS — M8589 Other specified disorders of bone density and structure, multiple sites: Secondary | ICD-10-CM | POA: Diagnosis not present

## 2014-12-04 DIAGNOSIS — Z780 Asymptomatic menopausal state: Secondary | ICD-10-CM | POA: Diagnosis not present

## 2014-12-30 ENCOUNTER — Emergency Department: Payer: Medicare Other

## 2014-12-30 ENCOUNTER — Emergency Department
Admission: EM | Admit: 2014-12-30 | Discharge: 2014-12-30 | Disposition: A | Discharge status: Home / Self Care | Payer: Medicare Other | Attending: Emergency Medicine | Admitting: Emergency Medicine

## 2014-12-30 DIAGNOSIS — K8020 Calculus of gallbladder without cholecystitis without obstruction: Secondary | ICD-10-CM | POA: Diagnosis not present

## 2014-12-30 DIAGNOSIS — Z7982 Long term (current) use of aspirin: Secondary | ICD-10-CM | POA: Diagnosis not present

## 2014-12-30 DIAGNOSIS — R1013 Epigastric pain: Secondary | ICD-10-CM | POA: Diagnosis not present

## 2014-12-30 DIAGNOSIS — Z79899 Other long term (current) drug therapy: Secondary | ICD-10-CM | POA: Insufficient documentation

## 2014-12-30 DIAGNOSIS — Z91040 Latex allergy status: Secondary | ICD-10-CM | POA: Insufficient documentation

## 2014-12-30 LAB — COMPREHENSIVE METABOLIC PANEL
ALK PHOS: 49 U/L (ref 38–126)
ALT: 20 U/L (ref 14–54)
AST: 25 U/L (ref 15–41)
Albumin: 4.3 g/dL (ref 3.5–5.0)
Anion gap: 8 (ref 5–15)
BILIRUBIN TOTAL: 0.5 mg/dL (ref 0.3–1.2)
BUN: 20 mg/dL (ref 6–20)
CALCIUM: 10.4 mg/dL — AB (ref 8.9–10.3)
CHLORIDE: 109 mmol/L (ref 101–111)
CO2: 24 mmol/L (ref 22–32)
CREATININE: 0.96 mg/dL (ref 0.44–1.00)
GFR, EST NON AFRICAN AMERICAN: 58 mL/min — AB (ref >60)
Glucose, Bld: 198 mg/dL — ABNORMAL HIGH (ref 65–99)
Potassium: 3.6 mmol/L (ref 3.5–5.1)
Sodium: 141 mmol/L (ref 135–145)
TOTAL PROTEIN: 8 g/dL (ref 6.5–8.1)

## 2014-12-30 LAB — TROPONIN I: Troponin I: <0.03 ng/mL (ref <0.031)

## 2014-12-30 LAB — CBC
HCT: 40.1 % (ref 35.0–47.0)
Hemoglobin: 13.1 g/dL (ref 12.0–16.0)
MCH: 28.6 pg (ref 26.0–34.0)
MCHC: 32.8 g/dL (ref 32.0–36.0)
MCV: 87.2 fL (ref 80.0–100.0)
PLATELETS: 353 10*3/uL (ref 150–440)
RBC: 4.6 MIL/uL (ref 3.80–5.20)
RDW: 13.6 % (ref 11.5–14.5)
WBC: 9.4 10*3/uL (ref 3.6–11.0)

## 2014-12-30 LAB — LIPASE, BLOOD: LIPASE: 35 U/L (ref 22–51)

## 2014-12-30 MED ORDER — ONDANSETRON HCL 4 MG PO TABS: 4.0000 mg | ORAL_TABLET | Freq: Three times a day (TID) | ORAL | Status: DC | PRN

## 2014-12-30 MED ORDER — ONDANSETRON HCL 4 MG/2ML IJ SOLN
4.0000 mg | Freq: Once | INTRAMUSCULAR | Status: AC
  Administered 2014-12-30: 4 mg via INTRAVENOUS
  Filled 2014-12-30: qty 2

## 2014-12-30 MED ORDER — GI COCKTAIL ~~LOC~~
30.0000 mL | Freq: Once | ORAL | Status: AC
  Administered 2014-12-30: 30 mL via ORAL
  Filled 2014-12-30: qty 30

## 2014-12-30 MED ORDER — MORPHINE SULFATE (PF) 4 MG/ML IV SOLN
4.0000 mg | Freq: Once | INTRAVENOUS | Status: AC
  Administered 2014-12-30: 4 mg via INTRAVENOUS
  Filled 2014-12-30: qty 1

## 2014-12-30 MED ORDER — TRAMADOL HCL 50 MG PO TABS: 50.0000 mg | ORAL_TABLET | Freq: Four times a day (QID) | ORAL | Status: AC | PRN

## 2014-12-30 MED ORDER — ONDANSETRON HCL 4 MG/2ML IJ SOLN
INTRAMUSCULAR | Status: AC
  Administered 2014-12-30: 4 mg via INTRAVENOUS
  Filled 2014-12-30: qty 2

## 2014-12-30 MED ORDER — NITROGLYCERIN 0.4 MG SL SUBL
0.4000 mg | SUBLINGUAL_TABLET | SUBLINGUAL | Status: DC | PRN
  Administered 2014-12-30: 0.4 mg via SUBLINGUAL
  Filled 2014-12-30: qty 3

## 2014-12-30 MED ORDER — PANTOPRAZOLE SODIUM 20 MG PO TBEC: 20.0000 mg | DELAYED_RELEASE_TABLET | Freq: Every day | ORAL | Status: DC

## 2014-12-30 NOTE — ED Notes (Signed)
Pt began having dry heaves and nausea immediately after nitroglycerin x second tab administered. Did not administer 3rd tab.

## 2014-12-30 NOTE — ED Notes (Signed)
Discharge instructions, follow-up care, and prescriptions reviewed with patient and spouse. No questions or concerns at this time.

## 2014-12-30 NOTE — ED Provider Notes (Signed)
[facility] Emergency Department Provider Note  ____________________________________________  Time seen: Approximately 610 AM  I have reviewed the triage vital signs and the nursing notes.   HISTORY  Chief Complaint Abdominal Pain    HPI Cheryl Velez is a 74 y.o. female who comes in with epigastric pain that started around 2:30 AM. The patient reports that when she went to bed she did have some pain but seemed to get worse and worse and worse. The patient reports that the pain is sharp and she's never had this pain before in her life. The patient reports that she's had indigestion in the past but this pain is a 10 out of 10 in intensity. She endorses some nausea and vomiting with no shortness of breath. The patient is also been quite sweaty and has some abdominal pain. She reports that the pain seems to radiate into her back but does not go up into her chest into her arm shoulders or neck. The patient is in some severe discomfort so she decided to come into the hospital for further evaluation.   Past Medical History  Diagnosis Date  . PTSD (post-traumatic stress disorder)   . Arthritis   . Thyroid disease   . Adenomatous colon polyp   . Hypothyroidism   . Hyperlipidemia     Patient Active Problem List   Diagnosis Date Noted  . Hyperlipidemia 02/14/2013  . Chest pain 01/06/2013  . Hypothyroidism 01/06/2013    Past Surgical History  Procedure Laterality Date  . Cardiac catheterization    . Knee surgery      Current Outpatient Rx  Name  Route  Sig  Dispense  Refill  . aspirin EC 81 MG tablet   Oral   Take 81 mg by mouth daily.         Marland Kitchen b complex vitamins tablet   Oral   Take 1 tablet by mouth daily.         . calcium-vitamin D (OSCAL WITH D) 500-200 MG-UNIT per tablet   Oral   Take 1 tablet by mouth daily with breakfast.         . cyclobenzaprine (FLEXERIL) 5 MG tablet   Oral   Take 1 tablet (5 mg total) by mouth 3 (three) times daily  as needed for muscle spasms (after the valium is gone).   30 tablet   0   . diazepam (VALIUM) 5 MG tablet   Oral   Take 1 tablet (5 mg total) by mouth every 8 (eight) hours as needed for muscle spasms.   10 tablet   0   . fenofibrate 160 MG tablet   Oral   Take 160 mg by mouth daily.         Marland Kitchen levothyroxine (SYNTHROID, LEVOTHROID) 75 MCG tablet   Oral   Take 75 mcg by mouth daily before breakfast.         . Multiple Vitamin (MULTIVITAMIN WITH MINERALS) TABS tablet   Oral   Take 1 tablet by mouth daily.         . naproxen sodium (ANAPROX) 220 MG tablet   Oral   Take 440 mg by mouth daily as needed (pain).         . predniSONE (DELTASONE) 20 MG tablet      Take 3 po QD x 2d starting tomorrow, then 2 po QD x 3d then 1 po QD x 3d   15 tablet   0   . traMADol (ULTRAM)  50 MG tablet   Oral   Take 2 tablets (100 mg total) by mouth every 6 (six) hours as needed.   16 tablet   0     Allergies Shellfish allergy; Latex; Nickel; and Other  Family History  Problem Relation Age of Onset  . Stroke Mother   . Stroke Sister   . CAD Brother     Social History Social History  Substance Use Topics  . Smoking status: Never Smoker   . Smokeless tobacco: Never Used  . Alcohol Use: No    Review of Systems Constitutional: sweat with No fever/chills Eyes: No visual changes. ENT: No sore throat. Cardiovascular: Denies chest pain. Respiratory: Denies shortness of breath. Gastrointestinal: abdominal pain.   nausea, vomiting.  No diarrhea.  No constipation. Genitourinary: Negative for dysuria. Musculoskeletal: Negative for back pain. Skin: Negative for rash. Neurological: Negative for headaches, focal weakness or numbness.  10-point ROS otherwise negative.  ____________________________________________   PHYSICAL EXAM:  VITAL SIGNS: ED Triage Vitals  Enc Vitals Group     BP 12/30/14 0611 181/58 mmHg     Pulse Rate 12/30/14 0610 82     Resp 12/30/14 0611 26      Temp 12/30/14 0611 97.9 F (36.6 C)     Temp Source 12/30/14 0611 Oral     SpO2 12/30/14 0610 97 %     Weight 12/30/14 0611 223 lb 1.7 oz (101.2 kg)     Height 12/30/14 0611 5\' 5"  (1.651 m)     Head Cir --      Peak Flow --      Pain Score 12/30/14 0612 10     Pain Loc --      Pain Edu? --      Excl. in [place]? --     Constitutional: Alert and oriented. Well appearing and in severe distress. Eyes: Conjunctivae are normal. PERRL. EOMI. Head: Atraumatic. Nose: No congestion/rhinnorhea. Mouth/Throat: Mucous membranes are moist.  Oropharynx non-erythematous. Cardiovascular: Normal rate, regular rhythm. Grossly normal heart sounds.  Good peripheral circulation. Respiratory: Normal respiratory effort.  No retractions. Lungs CTAB. Gastrointestinal: Soft with right upper quadrant and epigastric tenderness to palpation. No distention. Positive bowel sounds Musculoskeletal: No lower extremity tenderness nor edema.  No joint effusions. Neurologic:  Normal speech and language.  Skin:  Skin is warm, dry and intact.  Psychiatric: Mood and affect are normal.   ____________________________________________   LABS (all labs ordered are listed, but only abnormal results are displayed)  Labs Reviewed  COMPREHENSIVE METABOLIC PANEL - Abnormal; Notable for the following:    Glucose, Bld 198 (*)    Calcium 10.4 (*)    GFR calc non Af Amer 58 (*)    All other components within normal limits  CBC  TROPONIN I  LIPASE, BLOOD   ____________________________________________  EKG  ED ECG REPORT I, Loney Hering, the attending physician, personally viewed and interpreted this ECG.   Date: 12/30/2014  EKG Time: 606  Rate: 71  Rhythm: normal sinus rhythm  Axis: normal  Intervals:none  ST&T Change: Flipped T waves in leads 1, 2, aVL, V2, V3, V4, V5, V6 history of flipped T waves in leads 1 and aVL in 2014 as well as V4 V5 and  V6  ____________________________________________  RADIOLOGY  Chest x-ray: Cardiomegaly with pulmonary venous congestion, but no frank pulmonary edema, atherosclerosis ____________________________________________   PROCEDURES  Procedure(s) performed: None  Critical Care performed: No  ____________________________________________   INITIAL IMPRESSION / ASSESSMENT AND PLAN /  ED COURSE  Pertinent labs & imaging results that were available during my care of the patient were reviewed by me and considered in my medical decision making (see chart for details).  This is a 74 year old female who comes in with epigastric pain sweats and vomiting today. The patient did have an initial troponin done as well as a lipase which were unremarkable. The patient has some flipped T waves on her EKG which are old but she does have some new ones from 2014. I will do a right upper quadrant ultrasound on the patient to determine if she has any gallbladder disease as well as a repeat troponin. The patient's care was signed out to Dr. Marcelene Butte who will follow-up the results of the ultrasound as well as the repeat troponin. The patient was given some morphine as well as nitroglycerin for her pain. ____________________________________________   FINAL CLINICAL IMPRESSION(S) / ED DIAGNOSES  Final diagnoses:  Epigastric pain      Loney Hering, MD 12/30/14 [id number]

## 2014-12-30 NOTE — ED Provider Notes (Signed)
Patient was handed off by my partner this morning at checkout Dr. Dahlia Client. Patient's mother remained hemodynamically stable essentially pain-free and exclusively complains of epigastric pain. The plan for the patient was to recheck her troponin levels 2 hours after her prior troponin which was negative. His ambulation shows no peritoneal signs and based on her history of epigastric pain with nausea and vomiting she may have had some transient cholelithiasis with some gallstones seen on ultrasound. This also could be simply gastritis. Questions and concerns were addressed at the bedside and the patient was discharged on Ultram and Zofran and also add some Prilosec or protonix. Patient's was discharged with abdominal pain instructions advised especially to return here for lower abdominal pain, fever, hematemesis, or any other new concerns.  Cheryl Larsen, MD 12/30/14 [phone]

## 2014-12-30 NOTE — Discharge Instructions (Signed)
Abdominal Pain °Many things can cause abdominal pain. Usually, abdominal pain is not caused by a disease and will improve without treatment. It can often be observed and treated at home. Your health care provider will do a physical exam and possibly order blood tests and X-rays to help determine the seriousness of your pain. However, in many cases, more time must pass before a clear cause of the pain can be found. Before that point, your health care provider may not know if you need more testing or further treatment. °HOME CARE INSTRUCTIONS  °Monitor your abdominal pain for any changes. The following actions may help to alleviate any discomfort you are experiencing: °· Only take over-the-counter or prescription medicines as directed by your health care provider. °· Do not take laxatives unless directed to do so by your health care provider. °· Try a clear liquid diet (broth, tea, or water) as directed by your health care provider. Slowly move to a bland diet as tolerated. °SEEK MEDICAL CARE IF: °· You have unexplained abdominal pain. °· You have abdominal pain associated with nausea or diarrhea. °· You have pain when you urinate or have a bowel movement. °· You experience abdominal pain that wakes you in the night. °· You have abdominal pain that is worsened or improved by eating food. °· You have abdominal pain that is worsened with eating fatty foods. °· You have a fever. °SEEK IMMEDIATE MEDICAL CARE IF:  °· Your pain does not go away within 2 hours. °· You keep throwing up (vomiting). °· Your pain is felt only in portions of the abdomen, such as the right side or the left lower portion of the abdomen. °· You pass bloody or black tarry stools. °MAKE SURE YOU: °· Understand these instructions.   °· Will watch your condition.   °· Will get help right away if you are not doing well or get worse.   °Document Released: 01/22/2005 Document Revised: 04/19/2013 Document Reviewed: 12/22/2012 °[employer]® Patient Information  ©2015 [employer]. This information is not intended to replace advice given to you by your health care provider. Make sure you discuss any questions you have with your health care provider. ° °Please return immediately if condition worsens. Please contact her primary physician or the physician you were given for referral. If you have any specialist physicians involved in her treatment and plan please also contact them. Thank you for using [facility]. ° °

## 2014-12-30 NOTE — ED Notes (Signed)
Pt states onset of severe epigastric pain since midnight worse over last 3 hours. Pt arrives to ed, pale, sweaty.

## 2015-02-12 DIAGNOSIS — Z23 Encounter for immunization: Secondary | ICD-10-CM | POA: Diagnosis not present

## 2015-02-12 DIAGNOSIS — I10 Essential (primary) hypertension: Secondary | ICD-10-CM | POA: Diagnosis not present

## 2015-03-01 DIAGNOSIS — E6609 Other obesity due to excess calories: Secondary | ICD-10-CM | POA: Diagnosis not present

## 2015-03-01 DIAGNOSIS — I10 Essential (primary) hypertension: Secondary | ICD-10-CM | POA: Diagnosis not present

## 2015-03-01 DIAGNOSIS — Z6836 Body mass index (BMI) 36.0-36.9, adult: Secondary | ICD-10-CM | POA: Diagnosis not present

## 2015-05-14 DIAGNOSIS — G4733 Obstructive sleep apnea (adult) (pediatric): Secondary | ICD-10-CM | POA: Diagnosis not present

## 2015-06-01 DIAGNOSIS — Z6835 Body mass index (BMI) 35.0-35.9, adult: Secondary | ICD-10-CM | POA: Diagnosis not present

## 2015-06-01 DIAGNOSIS — I10 Essential (primary) hypertension: Secondary | ICD-10-CM | POA: Diagnosis not present

## 2015-06-01 DIAGNOSIS — M5136 Other intervertebral disc degeneration, lumbar region: Secondary | ICD-10-CM | POA: Diagnosis not present

## 2015-06-01 DIAGNOSIS — E785 Hyperlipidemia, unspecified: Secondary | ICD-10-CM | POA: Diagnosis not present

## 2015-06-01 DIAGNOSIS — E6609 Other obesity due to excess calories: Secondary | ICD-10-CM | POA: Diagnosis not present

## 2015-06-01 DIAGNOSIS — K580 Irritable bowel syndrome with diarrhea: Secondary | ICD-10-CM | POA: Diagnosis not present

## 2015-08-20 ENCOUNTER — Other Ambulatory Visit: Payer: Self-pay | Admitting: Family Medicine

## 2015-08-20 ENCOUNTER — Ambulatory Visit
Admission: RE | Admit: 2015-08-20 | Discharge: 2015-08-20 | Disposition: A | Discharge status: Home / Self Care | Payer: Medicare Other | Source: Ambulatory Visit | Attending: Family Medicine | Admitting: Family Medicine

## 2015-08-20 DIAGNOSIS — M25511 Pain in right shoulder: Secondary | ICD-10-CM | POA: Diagnosis not present

## 2015-08-20 DIAGNOSIS — S4991XA Unspecified injury of right shoulder and upper arm, initial encounter: Secondary | ICD-10-CM | POA: Diagnosis not present

## 2015-11-08 DIAGNOSIS — J4521 Mild intermittent asthma with (acute) exacerbation: Secondary | ICD-10-CM | POA: Diagnosis not present

## 2015-11-08 DIAGNOSIS — E039 Hypothyroidism, unspecified: Secondary | ICD-10-CM | POA: Diagnosis not present

## 2015-11-08 DIAGNOSIS — Z0000 Encounter for general adult medical examination without abnormal findings: Secondary | ICD-10-CM | POA: Diagnosis not present

## 2015-11-08 DIAGNOSIS — E785 Hyperlipidemia, unspecified: Secondary | ICD-10-CM | POA: Diagnosis not present

## 2015-11-08 DIAGNOSIS — I10 Essential (primary) hypertension: Secondary | ICD-10-CM | POA: Diagnosis not present

## 2015-11-08 DIAGNOSIS — Z86010 Personal history of colonic polyps: Secondary | ICD-10-CM | POA: Diagnosis not present

## 2015-12-28 DIAGNOSIS — D126 Benign neoplasm of colon, unspecified: Secondary | ICD-10-CM | POA: Diagnosis not present

## 2015-12-28 DIAGNOSIS — Z86010 Personal history of colonic polyps: Secondary | ICD-10-CM | POA: Diagnosis not present

## 2015-12-28 DIAGNOSIS — K5730 Diverticulosis of large intestine without perforation or abscess without bleeding: Secondary | ICD-10-CM | POA: Diagnosis not present

## 2015-12-28 DIAGNOSIS — D122 Benign neoplasm of ascending colon: Secondary | ICD-10-CM | POA: Diagnosis not present

## 2015-12-28 DIAGNOSIS — K640 First degree hemorrhoids: Secondary | ICD-10-CM | POA: Diagnosis not present

## 2016-01-03 DIAGNOSIS — D126 Benign neoplasm of colon, unspecified: Secondary | ICD-10-CM | POA: Diagnosis not present

## 2016-01-08 DIAGNOSIS — Z23 Encounter for immunization: Secondary | ICD-10-CM | POA: Diagnosis not present

## 2016-01-08 DIAGNOSIS — R202 Paresthesia of skin: Secondary | ICD-10-CM | POA: Diagnosis not present

## 2016-05-03 ENCOUNTER — Emergency Department: Payer: Medicare Other

## 2016-05-03 DIAGNOSIS — R1013 Epigastric pain: Secondary | ICD-10-CM | POA: Diagnosis not present

## 2016-05-03 DIAGNOSIS — R112 Nausea with vomiting, unspecified: Secondary | ICD-10-CM | POA: Diagnosis not present

## 2016-05-03 DIAGNOSIS — Z79899 Other long term (current) drug therapy: Secondary | ICD-10-CM | POA: Diagnosis not present

## 2016-05-03 DIAGNOSIS — Z7982 Long term (current) use of aspirin: Secondary | ICD-10-CM | POA: Insufficient documentation

## 2016-05-03 DIAGNOSIS — K810 Acute cholecystitis: Secondary | ICD-10-CM | POA: Diagnosis not present

## 2016-05-03 DIAGNOSIS — E039 Hypothyroidism, unspecified: Secondary | ICD-10-CM | POA: Insufficient documentation

## 2016-05-03 DIAGNOSIS — Z91040 Latex allergy status: Secondary | ICD-10-CM | POA: Diagnosis not present

## 2016-05-03 DIAGNOSIS — K760 Fatty (change of) liver, not elsewhere classified: Secondary | ICD-10-CM | POA: Diagnosis not present

## 2016-05-03 LAB — CBC
HEMATOCRIT: 37.2 % (ref 35.0–47.0)
Hemoglobin: 12.6 g/dL (ref 12.0–16.0)
MCH: 29.5 pg (ref 26.0–34.0)
MCHC: 33.9 g/dL (ref 32.0–36.0)
MCV: 87.1 fL (ref 80.0–100.0)
PLATELETS: 334 10*3/uL (ref 150–440)
RBC: 4.27 MIL/uL (ref 3.80–5.20)
RDW: 13.4 % (ref 11.5–14.5)
WBC: 10.5 10*3/uL (ref 3.6–11.0)

## 2016-05-03 LAB — COMPREHENSIVE METABOLIC PANEL
ALT: 21 U/L (ref 14–54)
AST: 26 U/L (ref 15–41)
Albumin: 4 g/dL (ref 3.5–5.0)
Alkaline Phosphatase: 37 U/L — ABNORMAL LOW (ref 38–126)
Anion gap: 5 (ref 5–15)
BUN: 23 mg/dL — ABNORMAL HIGH (ref 6–20)
CHLORIDE: 109 mmol/L (ref 101–111)
CO2: 27 mmol/L (ref 22–32)
CREATININE: 1.18 mg/dL — AB (ref 0.44–1.00)
Calcium: 10.1 mg/dL (ref 8.9–10.3)
GFR calc Af Amer: 52 mL/min — ABNORMAL LOW (ref >60)
GFR calc non Af Amer: 45 mL/min — ABNORMAL LOW (ref >60)
Glucose, Bld: 145 mg/dL — ABNORMAL HIGH (ref 65–99)
POTASSIUM: 4.5 mmol/L (ref 3.5–5.1)
SODIUM: 141 mmol/L (ref 135–145)
Total Bilirubin: 0.6 mg/dL (ref 0.3–1.2)
Total Protein: 7.3 g/dL (ref 6.5–8.1)

## 2016-05-03 LAB — LIPASE, BLOOD: LIPASE: 32 U/L (ref 11–51)

## 2016-05-03 MED ORDER — ONDANSETRON HCL 4 MG/2ML IJ SOLN
INTRAMUSCULAR | Status: AC
  Administered 2016-05-03: 4 mg via INTRAVENOUS
  Filled 2016-05-03: qty 2

## 2016-05-03 MED ORDER — MORPHINE SULFATE (PF) 2 MG/ML IV SOLN
INTRAVENOUS | Status: AC
  Administered 2016-05-03: 2 mg via INTRAVENOUS
  Filled 2016-05-03: qty 1

## 2016-05-03 MED ORDER — MORPHINE SULFATE (PF) 2 MG/ML IV SOLN
2.0000 mg | Freq: Once | INTRAVENOUS | Status: AC
  Administered 2016-05-03: 2 mg via INTRAVENOUS

## 2016-05-03 MED ORDER — ONDANSETRON HCL 4 MG/2ML IJ SOLN
INTRAMUSCULAR | Status: AC
  Filled 2016-05-03: qty 2

## 2016-05-03 MED ORDER — GI COCKTAIL ~~LOC~~
ORAL | Status: AC
  Administered 2016-05-03: 30 mL
  Filled 2016-05-03: qty 30

## 2016-05-03 MED ORDER — ONDANSETRON HCL 4 MG/2ML IJ SOLN
4.0000 mg | Freq: Once | INTRAMUSCULAR | Status: AC | PRN
  Administered 2016-05-03: 4 mg via INTRAVENOUS

## 2016-05-03 MED ORDER — ONDANSETRON HCL 4 MG/2ML IJ SOLN
4.0000 mg | Freq: Once | INTRAMUSCULAR | Status: AC
  Administered 2016-05-03: 4 mg via INTRAVENOUS

## 2016-05-03 NOTE — ED Triage Notes (Signed)
Patient reports sudden onset of abdominal pain and vomiting after eating approximate 1 hour ago.  Patient reports having similar pain in the past and being told it was reflux.

## 2016-05-03 NOTE — ED Notes (Signed)
In at [facility] request to perform EKG on patient

## 2016-05-04 ENCOUNTER — Emergency Department: Payer: Medicare Other

## 2016-05-04 ENCOUNTER — Emergency Department
Admission: EM | Admit: 2016-05-04 | Discharge: 2016-05-04 | Disposition: A | Discharge status: Home / Self Care | Payer: Medicare Other | Attending: Emergency Medicine | Admitting: Emergency Medicine

## 2016-05-04 DIAGNOSIS — K810 Acute cholecystitis: Secondary | ICD-10-CM | POA: Diagnosis not present

## 2016-05-04 DIAGNOSIS — R1013 Epigastric pain: Secondary | ICD-10-CM

## 2016-05-04 LAB — TROPONIN I: Troponin I: <0.03 ng/mL (ref <0.03)

## 2016-05-04 MED ORDER — HYDROCODONE-ACETAMINOPHEN 5-325 MG PO TABS: 1.0000 | ORAL_TABLET | Freq: Four times a day (QID) | ORAL | 0 refills | Status: DC | PRN

## 2016-05-04 MED ORDER — ONDANSETRON HCL 4 MG PO TABS: 4.0000 mg | ORAL_TABLET | Freq: Three times a day (TID) | ORAL | 0 refills | Status: DC | PRN

## 2016-05-04 NOTE — Discharge Instructions (Signed)
Please return immediately if condition worsens. Please contact her primary physician or the physician you were given for referral. If you have any specialist physicians involved in her treatment and plan please also contact them. Thank you for using [facility].  Please return the emergency department especially if he develops a fever, uncontrolled vomiting, increased pain or any other new concerns

## 2016-05-04 NOTE — ED Notes (Signed)
Patient observed resting in bed with NAD noted. No anticipated needs at this time. Will continue to monitor.

## 2016-05-04 NOTE — ED Notes (Addendum)
Green top tube collected from previously established PIV in [facility] without difficulties. Sample sent to cover MD orders for add on troponin.

## 2016-05-04 NOTE — ED Notes (Signed)
Warm blankets provided. Spouse appears uncomfortable; provided with recliner. Awaiting ultrasound at this time. No further verbalized needs at this time. Will continue to monitor.

## 2016-05-04 NOTE — ED Provider Notes (Signed)
Time Seen: Approximately0445  I have reviewed the triage notes  Chief Complaint: Abdominal Pain   History of Present Illness: Cheryl Velez is a 76 y.o. female *who presents with some sudden onset of epigastric abdominal pain with some persistent nausea and vomiting. Patient states she's had nausea and vomiting and had 3 bowel movements while here in emergency department. She states it felt like she "" he ate too much "". She denies any diaphoresis or chest pain. States pain radiates from the epigastric area where she points to to the middle of her back. She denies any calf tenderness or swelling. She denies any pleuritic or positional component patient received medication in triage with Zofran, Phenergan, morphine, and a GI cocktail. Patient states that she felt symptomatic relief. States she had one other episode that was very similar in the past.   Past Medical History:  Diagnosis Date  . Adenomatous colon polyp   . Arthritis   . Hyperlipidemia   . Hypothyroidism   . PTSD (post-traumatic stress disorder)   . Thyroid disease     Patient Active Problem List   Diagnosis Date Noted  . Hyperlipidemia 02/14/2013  . Chest pain 01/06/2013  . Hypothyroidism 01/06/2013    Past Surgical History:  Procedure Laterality Date  . CARDIAC CATHETERIZATION    . KNEE SURGERY      Past Surgical History:  Procedure Laterality Date  . CARDIAC CATHETERIZATION    . KNEE SURGERY      Current Outpatient Rx  . Order #: [id number] Class: Historical Med  . Order #: [id number] Class: Historical Med  . Order #: [id number] Class: Historical Med  . Order #: [id number] Class: Print  . Order #: [id number] Class: Print  . Order #: [id number] Class: Historical Med  . Order #: [id number] Class: Historical Med  . Order #: [id number] Class: Historical Med  . Order #: [id number] Class: Historical Med  . Order #: [id number] Class: Print  . Order #: [id number] Class: Print  . Order #: [id number] Class: Print    Allergies:  Shellfish  allergy; Latex; Nickel; and Other  Family History: Family History  Problem Relation Age of Onset  . Stroke Mother   . Stroke Sister   . CAD Brother     Social History: Social History  Substance Use Topics  . Smoking status: Never Smoker  . Smokeless tobacco: Never Used  . Alcohol use No     Review of Systems:   10 point review of systems was performed and was otherwise negative:  Constitutional: No fever Eyes: No visual disturbances ENT: No sore throat, ear pain Cardiac: No chest pain Respiratory: No shortness of breath, wheezing, or stridor Abdomen: Pain was mostly in the epigastric area with no lower abdominal pain flank or side pain. Endocrine: No weight loss, No night sweats Extremities: No peripheral edema, cyanosis Skin: No rashes, easy bruising Neurologic: No focal weakness, trouble with speech or swollowing Urologic: No dysuria, Hematuria, or urinary frequency   Physical Exam:  ED Triage Vitals  Enc Vitals Group     BP 05/03/16 2121 (!) 178/69     Pulse Rate 05/03/16 2121 80     Resp 05/03/16 2121 20     Temp 05/03/16 2121 98.4 F (36.9 C)     Temp Source 05/03/16 2121 Oral     SpO2 05/03/16 2121 100 %     Weight 05/03/16 2120 190 lb (86.2 kg)     Height 05/03/16 2120 5' 4.5" (1.638 m)     Head Circumference --  Peak Flow --      Pain Score --      Pain Loc --      Pain Edu? --      Excl. in [place]? --     General: Awake , Alert , and Oriented times 3; GCS 15 Head: Normal cephalic , atraumatic Eyes: Pupils equal , round, reactive to light Nose/Throat: No nasal drainage, patent upper airway without erythema or exudate.  Neck: Supple, Full range of motion, No anterior adenopathy or palpable thyroid masses Lungs: Clear to ascultation without wheezes , rhonchi, or rales Heart: Regular rate, regular rhythm without murmurs , gallops , or rubs Abdomen: Soft, non tender without rebound, guarding , or rigidity; bowel sounds positive and symmetric in all  4 quadrants. No organomegaly .   No peritoneal signs noted focal tenderness over McBurney's point negative Murphy sign     Extremities: 2 plus symmetric pulses. No edema, clubbing or cyanosis Neurologic: normal ambulation, Motor symmetric without deficits, sensory intact Skin: warm, dry, no rashes   Labs:   All laboratory work was reviewed including any pertinent negatives or positives listed below:  Labs Reviewed  COMPREHENSIVE METABOLIC PANEL - Abnormal; Notable for the following:       Result Value   Glucose, Bld 145 (*)    BUN 23 (*)    Creatinine, Ser 1.18 (*)    Alkaline Phosphatase 37 (*)    GFR calc non Af Amer 45 (*)    GFR calc Af Amer 52 (*)    All other components within normal limits  LIPASE, BLOOD  CBC  URINALYSIS, COMPLETE (UACMP) WITH MICROSCOPIC    EKG: * ED ECG REPORT I, Daymon Larsen, the attending physician, personally viewed and interpreted this ECG.  Date: 05/04/2016 EKG Time:2146 Rate: 70 Rhythm: normal sinus rhythm QRS Axis: normal Intervals: normal ST/T Wave abnormalities: Diffuse nonspecific ST and T-wave abnormalities without any ischemic change and no significant change since September 2016 Conduction Disturbances: none Narrative Interpretation: unremarkable    Radiology: *  "Dg Abdomen Acute W/chest  Result Date: 05/03/2016 CLINICAL DATA:  Mid left upper abdominal pain and interscapular back pain. Nausea and vomiting. Onset tonight around 20:00. EXAM: DG ABDOMEN ACUTE W/ 1V CHEST COMPARISON:  12/30/2014 FINDINGS: There is no evidence of dilated bowel loops or free intraperitoneal air. No radiopaque calculi or other significant radiographic abnormality is seen. Mild unchanged cardiomegaly. The lungs are clear except for stable linear scarring in the left base. No large effusions. IMPRESSION: Negative abdominal radiographs.  No acute cardiopulmonary disease. Electronically Signed   By: Andreas Newport M.D.   On: 05/03/2016 23:57   US Abdomen  Limited Ruq  Result Date: 05/04/2016 CLINICAL DATA:  Epigastric pain for 10 hours EXAM: US ABDOMEN LIMITED - RIGHT UPPER QUADRANT COMPARISON:  None. FINDINGS: Gallbladder: Intraluminal gallbladder calculi measuring up to 1.2 cm. Mild gallbladder mural thickening, 4.2 mm. There is pericholecystic fluid, and the patient was tender to probe pressure over the gallbladder. There are are echogenic foci in the gallbladder wall with comet tail artifact, suggesting adenomyomatosis. Common bile duct: Diameter: Upper normal for age, 7.5 mm. Liver: Generalized echogenicity of hepatic parenchyma without focal lesion. This may represent fatty infiltration. IMPRESSION: 1. Cholelithiasis with gallbladder mural thickening, pericholecystic fluid and positive sonographic Murphy sign. This is concerning for acute cholecystitis 2. 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 3. Upper normal caliber of the extrahepatic bile ducts. 4. Hepatic steatosis Electronically Signed   By: Valerie Roys.D.  On: 05/04/2016 06:12  "   I personally reviewed the radiologic studies    ED Course:  Patient's stay here was uneventful and she felt symptomatically improved even prior to the medication. Her findings indicate acute cholecystitis. She is currently afebrile, asymptomatic, and has a normal white blood cell count and I felt immediate surgical consultation was not necessary. We discussed decreasing the amount of fat in her diet and she was prescribed outpatient pain medication and anti-medic therapy. She was advised follow-up with local surgery. She was advised to return here if she develops a fever, persistent vomiting, or any other new concerns. Clinical Course      Assessment:  Acute cholecystitis   Final Clinical Impression:   Final diagnoses:  Epigastric pain     Plan: Outpatient " Discharge Medication List as of 05/04/2016  7:06 AM    START taking these medications   Details   HYDROcodone-acetaminophen (NORCO) 5-325 MG tablet Take 1 tablet by mouth every 6 (six) hours as needed for moderate pain., Starting Sun 05/04/2016, Print      " Patient was advised to return immediately if condition worsens. Patient was advised to follow up with their primary care physician or other specialized physicians involved in their outpatient care. The patient and/or family member/power of attorney had laboratory results reviewed at the bedside. All questions and concerns were addressed and appropriate discharge instructions were distributed by the nursing staff.             Daymon Larsen, MD 05/04/16 [phone]

## 2016-05-04 NOTE — ED Notes (Signed)

## 2016-05-15 ENCOUNTER — Other Ambulatory Visit: Payer: Self-pay | Admitting: General Surgery

## 2016-05-15 DIAGNOSIS — K830 Cholangitis: Secondary | ICD-10-CM | POA: Diagnosis not present

## 2016-05-15 DIAGNOSIS — K219 Gastro-esophageal reflux disease without esophagitis: Secondary | ICD-10-CM | POA: Diagnosis not present

## 2016-05-15 DIAGNOSIS — Z6833 Body mass index (BMI) 33.0-33.9, adult: Secondary | ICD-10-CM | POA: Diagnosis not present

## 2016-05-15 DIAGNOSIS — K8020 Calculus of gallbladder without cholecystitis without obstruction: Secondary | ICD-10-CM | POA: Diagnosis not present

## 2016-05-15 DIAGNOSIS — G4730 Sleep apnea, unspecified: Secondary | ICD-10-CM | POA: Diagnosis not present

## 2016-05-15 DIAGNOSIS — E039 Hypothyroidism, unspecified: Secondary | ICD-10-CM | POA: Diagnosis not present

## 2016-05-19 DIAGNOSIS — K8020 Calculus of gallbladder without cholecystitis without obstruction: Secondary | ICD-10-CM | POA: Diagnosis not present

## 2016-05-20 ENCOUNTER — Other Ambulatory Visit: Payer: Self-pay | Admitting: General Surgery

## 2016-05-20 DIAGNOSIS — K8010 Calculus of gallbladder with chronic cholecystitis without obstruction: Secondary | ICD-10-CM | POA: Diagnosis not present

## 2016-05-20 DIAGNOSIS — K824 Cholesterolosis of gallbladder: Secondary | ICD-10-CM | POA: Diagnosis not present

## 2016-06-17 DIAGNOSIS — G4733 Obstructive sleep apnea (adult) (pediatric): Secondary | ICD-10-CM | POA: Diagnosis not present

## 2016-09-24 DIAGNOSIS — Z961 Presence of intraocular lens: Secondary | ICD-10-CM | POA: Diagnosis not present

## 2016-12-15 DIAGNOSIS — I10 Essential (primary) hypertension: Secondary | ICD-10-CM | POA: Diagnosis not present

## 2016-12-15 DIAGNOSIS — J209 Acute bronchitis, unspecified: Secondary | ICD-10-CM | POA: Diagnosis not present

## 2016-12-15 DIAGNOSIS — E785 Hyperlipidemia, unspecified: Secondary | ICD-10-CM | POA: Diagnosis not present

## 2016-12-15 DIAGNOSIS — E039 Hypothyroidism, unspecified: Secondary | ICD-10-CM | POA: Diagnosis not present

## 2017-01-15 DIAGNOSIS — E039 Hypothyroidism, unspecified: Secondary | ICD-10-CM | POA: Diagnosis not present

## 2017-01-15 DIAGNOSIS — E785 Hyperlipidemia, unspecified: Secondary | ICD-10-CM | POA: Diagnosis not present

## 2017-01-15 DIAGNOSIS — I10 Essential (primary) hypertension: Secondary | ICD-10-CM | POA: Diagnosis not present

## 2017-01-15 DIAGNOSIS — R7301 Impaired fasting glucose: Secondary | ICD-10-CM | POA: Diagnosis not present

## 2017-02-18 ENCOUNTER — Encounter (HOSPITAL_COMMUNITY): Payer: Self-pay

## 2017-02-18 ENCOUNTER — Emergency Department (HOSPITAL_COMMUNITY)
Admission: EM | Admit: 2017-02-18 | Discharge: 2017-02-19 | Disposition: A | Discharge status: Home / Self Care | Payer: Medicare Other | Attending: Emergency Medicine | Admitting: Emergency Medicine

## 2017-02-18 ENCOUNTER — Emergency Department (HOSPITAL_COMMUNITY): Payer: Medicare Other

## 2017-02-18 DIAGNOSIS — Z79899 Other long term (current) drug therapy: Secondary | ICD-10-CM | POA: Insufficient documentation

## 2017-02-18 DIAGNOSIS — Z91040 Latex allergy status: Secondary | ICD-10-CM | POA: Insufficient documentation

## 2017-02-18 DIAGNOSIS — Z7982 Long term (current) use of aspirin: Secondary | ICD-10-CM | POA: Diagnosis not present

## 2017-02-18 DIAGNOSIS — E039 Hypothyroidism, unspecified: Secondary | ICD-10-CM | POA: Diagnosis not present

## 2017-02-18 DIAGNOSIS — R079 Chest pain, unspecified: Secondary | ICD-10-CM | POA: Diagnosis not present

## 2017-02-18 DIAGNOSIS — R072 Precordial pain: Secondary | ICD-10-CM | POA: Diagnosis not present

## 2017-02-18 LAB — CBC
HCT: 37 % (ref 36.0–46.0)
HEMOGLOBIN: 12.5 g/dL (ref 12.0–15.0)
MCH: 30 pg (ref 26.0–34.0)
MCHC: 33.8 g/dL (ref 30.0–36.0)
MCV: 88.9 fL (ref 78.0–100.0)
Platelets: 298 10*3/uL (ref 150–400)
RBC: 4.16 MIL/uL (ref 3.87–5.11)
RDW: 13.5 % (ref 11.5–15.5)
WBC: 10.6 10*3/uL — ABNORMAL HIGH (ref 4.0–10.5)

## 2017-02-18 LAB — BASIC METABOLIC PANEL
Anion gap: 9 (ref 5–15)
BUN: 25 mg/dL — ABNORMAL HIGH (ref 6–20)
CO2: 21 mmol/L — AB (ref 22–32)
Calcium: 10 mg/dL (ref 8.9–10.3)
Chloride: 109 mmol/L (ref 101–111)
Creatinine, Ser: 0.94 mg/dL (ref 0.44–1.00)
GFR calc non Af Amer: 58 mL/min — ABNORMAL LOW (ref >60)
Glucose, Bld: 106 mg/dL — ABNORMAL HIGH (ref 65–99)
Potassium: 3.9 mmol/L (ref 3.5–5.1)
Sodium: 139 mmol/L (ref 135–145)

## 2017-02-18 LAB — POCT I-STAT TROPONIN I: Troponin i, poc: 0 ng/mL (ref 0.00–0.08)

## 2017-02-18 NOTE — ED Triage Notes (Signed)
Pt complains of left sided chest pain for 2 days, tonight she states it became worse Pt denies nausea, shortness of breath or radiating pain She describes her pain as a sharp tight feeling

## 2017-02-18 NOTE — ED Notes (Signed)
Attempted to get blood two times, but was unsuccessful.  

## 2017-02-19 DIAGNOSIS — R072 Precordial pain: Secondary | ICD-10-CM | POA: Diagnosis not present

## 2017-02-19 LAB — POCT I-STAT TROPONIN I: Troponin i, poc: 0.01 ng/mL (ref 0.00–0.08)

## 2017-02-19 MED ORDER — GI COCKTAIL ~~LOC~~
30.0000 mL | Freq: Once | ORAL | Status: AC
  Administered 2017-02-19: 30 mL via ORAL
  Filled 2017-02-19: qty 30

## 2017-02-19 NOTE — ED Provider Notes (Signed)
[facility] DEPT Provider Note   CSN: [id number] Arrival date & time: 02/18/17  2033     History   Chief Complaint Chief Complaint  Patient presents with  . Chest Pain    HPI Cheryl Velez is a 76 y.o. female.  The history is provided by the patient.  Chest Pain   This is a recurrent problem. The current episode started 12 to 24 hours ago. Episode frequency: comes and goes lasts seconds to a minute. The problem has not changed since onset.Associated with: singing and post prandial. The pain is present in the substernal region. The pain is moderate. The quality of the pain is described as sharp. The pain does not radiate. Episode Length: seconds to a minute. Exacerbated by: no relation. Pertinent negatives include no abdominal pain, no back pain, no diaphoresis, no dizziness, no exertional chest pressure, no fever, no headaches, no hemoptysis, no irregular heartbeat, no leg pain, no lower extremity edema, no nausea, no near-syncope, no palpitations, no shortness of breath, no sputum production and no vomiting. She has tried nothing for the symptoms. The treatment provided no relief. Risk factors include being elderly.    Past Medical History:  Diagnosis Date  . Adenomatous colon polyp   . Arthritis   . Hyperlipidemia   . Hypothyroidism   . PTSD (post-traumatic stress disorder)   . Thyroid disease     Patient Active Problem List   Diagnosis Date Noted  . Hyperlipidemia 02/14/2013  . Chest pain 01/06/2013  . Hypothyroidism 01/06/2013    Past Surgical History:  Procedure Laterality Date  . CARDIAC CATHETERIZATION    . KNEE SURGERY      OB History    No data available       Home Medications    Prior to Admission medications   Medication Sig Start Date End Date Taking? Authorizing Provider  acetaminophen (TYLENOL) 325 MG tablet Take 325 mg by mouth every 6 (six) hours as needed for headache.   Yes [provider]  aspirin EC 81  MG tablet Take 81 mg by mouth daily.   Yes [provider]  b complex vitamins tablet Take 1 tablet by mouth daily.   Yes [provider]  calcium-vitamin D (OSCAL WITH D) 500-200 MG-UNIT per tablet Take 1 tablet by mouth daily with breakfast.   Yes [provider]  fenofibrate 160 MG tablet Take 160 mg by mouth daily.   Yes [provider]  levothyroxine (SYNTHROID, LEVOTHROID) 75 MCG tablet Take 75 mcg by mouth daily before breakfast.   Yes [provider]  losartan (COZAAR) 100 MG tablet Take 100 mg by mouth daily. 12/16/16  Yes [provider]  Multiple Vitamin (MULTIVITAMIN WITH MINERALS) TABS tablet Take 1 tablet by mouth daily.   Yes [provider]  naproxen sodium (ANAPROX) 220 MG tablet Take 440 mg by mouth daily as needed (pain).   Yes [provider]  VITAMIN E PO Take 1 tablet by mouth daily.   Yes [provider]    Family History Family History  Problem Relation Age of Onset  . Stroke Mother   . Stroke Sister   . CAD Brother     Social History Social History  Substance Use Topics  . Smoking status: Never Smoker  . Smokeless tobacco: Never Used  . Alcohol use No     Allergies   Shellfish allergy; Latex; Nickel; and Other   Review of Systems Review of Systems  Constitutional: Negative for diaphoresis and fever.  Respiratory: Negative for hemoptysis, sputum production, chest tightness and shortness of breath.   Cardiovascular: Positive for chest pain. Negative for palpitations, leg swelling and near-syncope.  Gastrointestinal: Negative for abdominal pain, nausea and vomiting.  Musculoskeletal: Negative for back pain.  Neurological: Negative for dizziness and headaches.  All other systems reviewed and are negative.    Physical Exam Updated Vital Signs BP 124/74   Pulse 82   Temp 98 F (36.7 C) (Oral)   Resp 18   SpO2 99%   Physical Exam  Constitutional: She is oriented to  person, place, and time. She appears well-developed and well-nourished. No distress.  HENT:  Head: Normocephalic and atraumatic.  Mouth/Throat: No oropharyngeal exudate.  Eyes: Pupils are equal, round, and reactive to light. Conjunctivae are normal.  Neck: Normal range of motion. Neck supple.  Cardiovascular: Normal rate, regular rhythm, normal heart sounds and intact distal pulses.   Pulmonary/Chest: Effort normal and breath sounds normal. She has no wheezes. She has no rales.  Abdominal: Soft. Bowel sounds are normal. She exhibits no mass. There is no tenderness. There is no rebound and no guarding.  Musculoskeletal: Normal range of motion. She exhibits no edema or tenderness.  Neurological: She is alert and oriented to person, place, and time. She displays normal reflexes.  Skin: Skin is warm and dry. Capillary refill takes less than 2 seconds.  Psychiatric: She has a normal mood and affect.     ED Treatments / Results   Vitals:   02/18/17 2313 02/19/17 0155  BP: 128/72 124/74  Pulse: 80 82  Resp: 18 18  Temp: 98 F (36.7 C)   SpO2: 100% 99%    Labs (all labs ordered are listed, but only abnormal results are displayed)  Results for orders placed or performed during the hospital encounter of 60/63/01  Basic metabolic panel  Result Value Ref Range   Sodium 139 135 - 145 mmol/L   Potassium 3.9 3.5 - 5.1 mmol/L   Chloride 109 101 - 111 mmol/L   CO2 21 (L) 22 - 32 mmol/L   Glucose, Bld 106 (H) 65 - 99 mg/dL   BUN 25 (H) 6 - 20 mg/dL   Creatinine, Ser 0.94 0.44 - 1.00 mg/dL   Calcium 10.0 8.9 - 10.3 mg/dL   GFR calc non Af Amer 58 (L) >60 mL/min   GFR calc Af Amer >60 >60 mL/min   Anion gap 9 5 - 15  CBC  Result Value Ref Range   WBC 10.6 (H) 4.0 - 10.5 K/uL   RBC 4.16 3.87 - 5.11 MIL/uL   Hemoglobin 12.5 12.0 - 15.0 g/dL   HCT 37.0 36.0 - 46.0 %   MCV 88.9 78.0 - 100.0 fL   MCH 30.0 26.0 - 34.0 pg   MCHC 33.8 30.0 - 36.0 g/dL   RDW 13.5 11.5 - 15.5 %   Platelets  298 150 - 400 K/uL  POCT i-Stat troponin I  Result Value Ref Range   Troponin i, poc 0.00 0.00 - 0.08 ng/mL   Comment 3          POCT i-Stat troponin I  Result Value Ref Range   Troponin i, poc 0.01 0.00 - 0.08 ng/mL   Comment 3           Dg Chest 2 View  Result Date: 02/18/2017 CLINICAL DATA:  Left-sided chest pain for 2 days, initial encounter EXAM: CHEST  2 VIEW COMPARISON:  05/03/2016 FINDINGS:  Cardiac shadow is at the upper limits of normal but stable. No focal infiltrate or sizable effusion is seen. No acute bony abnormality is noted. IMPRESSION: No active cardiopulmonary disease. Electronically Signed   By: Inez Catalina M.D.   On: 02/18/2017 21:30    EKG  EKG Interpretation  Date/Time:  Wednesday February 18 2017 20:53:59 EDT Ventricular Rate:  92 PR Interval:    QRS Duration: 91 QT Interval:  356 QTC Calculation: 441 R Axis:   38 Text Interpretation:  Sinus rhythm Repol abnrm suggests ischemia, anterolateral Baseline wander in lead(s) II Confirmed by Randal Buba, Argie Applegate ([id number]) on 02/18/2017 11:25:45 PM       Radiology Dg Chest 2 View  Result Date: 02/18/2017 CLINICAL DATA:  Left-sided chest pain for 2 days, initial encounter EXAM: CHEST  2 VIEW COMPARISON:  05/03/2016 FINDINGS: Cardiac shadow is at the upper limits of normal but stable. No focal infiltrate or sizable effusion is seen. No acute bony abnormality is noted. IMPRESSION: No active cardiopulmonary disease. Electronically Signed   By: Inez Catalina M.D.   On: 02/18/2017 21:30    Procedures Procedures (including critical care time)  Medications Ordered in ED Medications  gi cocktail (Maalox,Lidocaine,Donnatal) (30 mLs Oral Given 02/19/17 0147)     Heart score is 2, low risk for MACE.   Final Clinical Impressions(s) / ED Diagnoses   Final diagnoses:  Precordial chest pain   Normal stress test in 2014.  Will have patient follow up with her PMD and cardiology as an outpatient.  Strict return precautions  given.  Strict return precautions given for  Shortness of breath, swelling or the lips or tongue, chest pain, dyspnea on exertion, new weakness or numbness changes in vision or speech,  Inability to tolerate liquids or food, changes in voice cough, altered mental status or any concerns. No signs of systemic illness or infection. The patient is nontoxic-appearing on exam and vital signs are within normal limits.    I have reviewed the triage vital signs and the nursing notes. Pertinent labs &imaging results that were available during my care of the patient were reviewed by me and considered in my medical decision making (see chart for details).  After history, exam, and medical workup I feel the patient has been appropriately medically screened and is safe for discharge home. Pertinent diagnoses were discussed with the patient. Patient was given return precautions.     Cloe Sockwell, MD 02/19/17 3419

## 2017-06-16 DIAGNOSIS — R011 Cardiac murmur, unspecified: Secondary | ICD-10-CM | POA: Diagnosis not present

## 2017-06-16 DIAGNOSIS — R109 Unspecified abdominal pain: Secondary | ICD-10-CM | POA: Diagnosis not present

## 2017-06-16 DIAGNOSIS — K219 Gastro-esophageal reflux disease without esophagitis: Secondary | ICD-10-CM | POA: Diagnosis not present

## 2017-06-16 DIAGNOSIS — R079 Chest pain, unspecified: Secondary | ICD-10-CM | POA: Diagnosis not present

## 2017-06-18 ENCOUNTER — Encounter: Payer: Self-pay | Admitting: Internal Medicine

## 2017-06-19 ENCOUNTER — Encounter: Payer: Self-pay | Admitting: Internal Medicine

## 2017-06-19 ENCOUNTER — Ambulatory Visit (INDEPENDENT_AMBULATORY_CARE_PROVIDER_SITE_OTHER): Payer: Medicare Other | Admitting: Internal Medicine

## 2017-06-19 VITALS — BP 150/68 | HR 81 | Ht 64.5 in | Wt 211.8 lb

## 2017-06-19 DIAGNOSIS — R079 Chest pain, unspecified: Secondary | ICD-10-CM | POA: Diagnosis not present

## 2017-06-19 DIAGNOSIS — I10 Essential (primary) hypertension: Secondary | ICD-10-CM | POA: Diagnosis not present

## 2017-06-19 DIAGNOSIS — I200 Unstable angina: Secondary | ICD-10-CM

## 2017-06-19 DIAGNOSIS — R011 Cardiac murmur, unspecified: Secondary | ICD-10-CM

## 2017-06-19 MED ORDER — METOPROLOL TARTRATE 25 MG PO TABS: 12.5000 mg | ORAL_TABLET | Freq: Two times a day (BID) | ORAL | 3 refills | Status: DC

## 2017-06-19 NOTE — Progress Notes (Signed)
New Outpatient Visit Date: 06/19/2017  Referring Provider: Antony Contras, MD [address] [place], [place] [address]  Chief Complaint: Chest and abdominal pain  HPI:  Cheryl Velez is a 77 y.o. female who is being seen today for the evaluation of chest and abdominal pain at the request of Dr. Moreen Fowler. She has a history of hypertension, hyperlipidemia, obstructive sleep apnea, hypothyroidism, osteoarthritis, and PTSD. She was evaluated by Dr. Moreen Fowler earlier this month, at which time she reported 10 days of mid epigastric and lower chest discomfort with accompanying nausea. She also reported a chronic cough and possible "flulike symptoms." Chest and epigastric discomfort were felt to be most consistent with GERD. However, new heart murmur was detected on exam, prompting referral for cardiology assessment.  Today, Cheryl Velez reports feeling better. However, she is concerned that her recent episodes of upper abdominal and lower chest pain could be related to her heart. She describes it as a sharp pain, occurring at any time. Pain was up to 4/10 in intensity and typically lasted up to 45 minutes at a time. Discomfort was not exertional or associated with other symptoms such as nausea, diaphoresis, and shortness of breath. She has not felt the pain for several days, but still has a vague uneasy feeling in her upper abdomen. She noted a more pronounced episode in 04/25/2023, at which time she also experienced nausea and double vision for a few minutes.  Cheryl Velez was evaluated in 2014 for chest pain, which time myocardial perfusion stress test was normal. However, Cheryl Velez reports that her more recent discomfort is different than what she experienced at that time. She also reports that she had a heart catheterization in the 1990s and believes that this was normal.  --------------------------------------------------------------------------------------------------  Cardiovascular History &  Procedures: Cardiovascular Problems:  Atypical chest pain  Risk Factors:  Hypertension, hyperlipidemia, obesity, and age greater than 79  Cath/PCI:  None  CV Surgery:  None  EP Procedures and Devices:  None  Non-Invasive Evaluation(s):  Exercise MPI (01/08/13): No ischemia or scar. LVEF 61%.  Recent CV Pertinent Labs: Lab Results  Component Value Date   CHOL 171 01/08/2013   HDL 38 (L) 01/08/2013   LDLCALC 90 01/08/2013   TRIG 215 (H) 01/08/2013   CHOLHDL 4.5 01/08/2013   K 3.9 02/18/2017   BUN 25 (H) 02/18/2017   CREATININE 0.94 02/18/2017    --------------------------------------------------------------------------------------------------  Past Medical History:  Diagnosis Date  . Adenomatous colon polyp   . Arthritis   . Chest pain 01/06/2013   Chest pain   . GI (gastrointestinal bleed)    [name], ADENOMATOUS POLYP 6/14, 3 YEAR FOLLOW UP, 9/17, 3 YEAR FOLLOW UP.  Cheryl Velez Hearing loss    BIATERAL HEARING AIDS, [name]  . Hypercholesterolemia   . Hyperlipidemia   . Hypertension    DX 8/16  . Hypothyroidism   . OSA (obstructive sleep apnea)    (PSG 03/21/12 ESS 6, AHI 14/hr REM 44/hr, RDI SAME, O2 min 77% APAP STARTED 12/15), [name]  . Osteoarthritis    KNEES, HANDS HAS SEEN [name]  . PTSD (post-traumatic stress disorder)   . Thyroid disease     Past Surgical History:  Procedure Laterality Date  . CARDIAC CATHETERIZATION    . KNEE SURGERY      Current Meds  Medication Sig  . acetaminophen (TYLENOL) 325 MG tablet Take 325 mg by mouth every 6 (six) hours as needed for headache.  . albuterol (PROAIR  HFA) 108 (90 Base) MCG/ACT inhaler Inhale 1-2 puffs into the lungs every 4 (four) hours as needed for wheezing or shortness of breath.  Cheryl Velez aspirin EC 81 MG tablet Take 81 mg by mouth daily.  Cheryl Velez b complex vitamins tablet Take 1 tablet by mouth daily.  . calcium-vitamin D (OSCAL WITH D) 500-200 MG-UNIT per tablet Take 1 tablet by mouth daily  with breakfast.  . fenofibrate 160 MG tablet Take 160 mg by mouth daily.  . hyoscyamine ([name]) 0.125 MG tablet Take 0.125 mg by mouth every 4 (four) hours as needed for cramping.  Cheryl Velez levothyroxine (SYNTHROID, LEVOTHROID) 75 MCG tablet Take 75 mcg by mouth daily before breakfast.  . losartan (COZAAR) 100 MG tablet Take 100 mg by mouth daily.  . Multiple Vitamin (MULTIVITAMIN WITH MINERALS) TABS tablet Take 1 tablet by mouth daily.  . naproxen sodium (ANAPROX) 220 MG tablet Take 440 mg by mouth daily as needed (pain).  Cheryl Velez VITAMIN E PO Take 1 tablet by mouth daily.    Allergies: Shellfish allergy; Lipitor [atorvastatin]; Paxil [paroxetine hcl]; Latex; Nickel; and Other  Social History   Socioeconomic History  . Marital status: Married    Spouse name: [name]  . Number of children: 3  . Years of education: Not on file  . Highest education level: Not on file  Social Needs  . Financial resource strain: Not on file  . Food insecurity - worry: Not on file  . Food insecurity - inability: Not on file  . Transportation needs - medical: Not on file  . Transportation needs - non-medical: Not on file  Occupational History  . Occupation: RETIRED  Tobacco Use  . Smoking status: Never Smoker  . Smokeless tobacco: Never Used  Substance and Sexual Activity  . Alcohol use: No  . Drug use: No  . Sexual activity: Not on file  Other Topics Concern  . Not on file  Social History Narrative  . Not on file    Family History  Problem Relation Age of Onset  . Stroke Mother   . Hypertension Mother   . Colon polyps Mother   . Suicidality Sister   . Hypertension Brother 67  . Heart attack Brother   . Hypertension Father   . CAD Father   . Cirrhosis Father   . CAD Maternal Grandmother   . CAD Maternal Grandfather   . Breast cancer Paternal Grandmother   . CAD Paternal Grandmother   . CAD Paternal Grandfather   . Heart Problems Brother 25  . Diabetes Brother 71  . Hypertension Brother   .  Heart Problems Brother   . Hypertension Sister   . Obesity Sister   . Diverticulitis Sister   . Hypertension Sister   . Gout Sister   . Alcohol abuse Sister   . Polycythemia Sister   . Other Brother        MURDERED  . Hypertension Brother   . CVA Brother   . Blindness Brother     Review of Systems: A 12-system review of systems was performed and was negative except as noted in the HPI.  --------------------------------------------------------------------------------------------------  Physical Exam: BP (!) 150/68   Pulse 81   Ht 5' 4.5" (1.638 m)   Wt 211 lb 12.8 oz (96.1 kg)   SpO2 93%   BMI 35.79 kg/m   General:  Obese woman, seated comfortably in the exam room. HEENT: No conjunctival pallor or scleral icterus. Moist mucous membranes. OP clear. Neck: Supple without lymphadenopathy, thyromegaly,  JVD, or HJR. No carotid bruit. Lungs: Normal work of breathing. Clear to auscultation bilaterally without wheezes or crackles. Heart: Regular rate and rhythm with 1/6 systolic murmur loudest at the left upper sternal border. No rubs or gallops. Unable to assess PMI due to body habitus. Abd: Bowel sounds present. Soft, NT/ND. Unable to assess HSM due to body habitus. Ext: No lower extremity edema. Radial, PT, and DP pulses are 2+ bilaterally Skin: Warm and dry without rash. Neuro: CNIII-XII intact. Strength and fine-touch sensation intact in upper and lower extremities bilaterally. Psych: Normal mood and affect.  EKG:  Normal sinus rhythm with LVH and ST/T changes in the inferior and anterolateral leads. ST/T changes are more pronounced than on prior tracings.  Lab Results  Component Value Date   WBC 10.6 (H) 02/18/2017   HGB 12.5 02/18/2017   HCT 37.0 02/18/2017   MCV 88.9 02/18/2017   PLT 298 02/18/2017    Lab Results  Component Value Date   NA 139 02/18/2017   K 3.9 02/18/2017   CL 109 02/18/2017   CO2 21 (L) 02/18/2017   BUN 25 (H) 02/18/2017   CREATININE 0.94  02/18/2017   GLUCOSE 106 (H) 02/18/2017   ALT 21 05/03/2016    Lab Results  Component Value Date   CHOL 171 01/08/2013   HDL 38 (L) 01/08/2013   LDLCALC 90 01/08/2013   TRIG 215 (H) 01/08/2013   CHOLHDL 4.5 01/08/2013   --------------------------------------------------------------------------------------------------  ASSESSMENT AND PLAN: Chest and epigastric pain, concerning for unstable angina Ms. Moore reports episodic epigastric and lower chest pain over the last few weeks, which has actually improved since being seen by Dr. Moreen Fowler. Her exam today is notable for a soft systolic murmur but otherwise no significant abnormalities. However, her EKG shows prominent ST/T changes that are more pronounced than on prior tracings. Cardiac risk factors include hypertension, hyperlipidemia, obesity, and age. While the EKG findings certainly could be due to LVH, given her new symptoms, I feel that it is important to exclude obstructive CAD. We have discussed noninvasive testing versus cardiac catheterization and have agreed to proceed directly to catheterization. I have reviewed the risks, indications, and alternatives to cardiac catheterization, possible angioplasty, and stenting with the patient. Risks include but are not limited to bleeding, infection, vascular injury, stroke, myocardial infection, arrhythmia, kidney injury, radiation-related injury in the case of prolonged fluoroscopy use, emergency cardiac surgery, and death. The patient understands the risks of serious complication is 1-2 in 7026 with diagnostic cardiac cath and 1-2% or less with angioplasty/stenting. She is scheduled for catheterization with Dr. Burt Knack on 06/23/17. In the meantime, we have agreed to start metoprolol tartrate 12.5 mg twice a day for anti-anginal therapy. She should continue aspirin 81 mg daily. If she has recurrent chest pain in the meantime, I advised Ms. Lafever to seek immediate medical attention. We will obtain CBC,  BMP, and PT/INR today in anticipation of catheterization.  Hypertension Blood pressure mildly elevated today. As above, we will add a low-dose metoprolol.  Heart murmur Soft systolic murmur appreciated on exam today. There are no signs or symptoms of heart failure. We will consider further assessment with echocardiogram following aforementioned catheterization.  Follow-up: Return to clinic in 1 month.  [name], MD 06/19/2017 12:11 PM

## 2017-06-19 NOTE — Patient Instructions (Addendum)
Medication Instructions:  START Metoprolol Tartrate 12.5 mg twice per day  -- If you need a refill on your cardiac medications before your next appointment, please call your pharmacy. --  Labwork: CBC/BMET/PT-INR Testing/Procedures: Your physician has requested that you have a cardiac catheterization. Cardiac catheterization is used to diagnose and/or treat various heart conditions. Doctors may recommend this procedure for a number of different reasons. The most common reason is to evaluate chest pain. Chest pain can be a symptom of coronary artery disease (CAD), and cardiac catheterization can show whether plaque is narrowing or blocking your heart's arteries. This procedure is also used to evaluate the valves, as well as measure the blood flow and oxygen levels in different parts of your heart. For further information please visit [email or web address]. Please follow instruction sheet, as given.    [facility] [address] [place] [address] [place] [address] Dept: [phone] Loc: [place]  06/19/2017  You are scheduled for a Cardiac Catheterization on Tuesday, February 26 with Dr. [name].  1. Please arrive at the [facility] (Main Entrance A) at [facility]: [place], [place] [address] at 6:00 AM (two hours before your procedure to ensure your preparation). Free valet parking service is available.   Special note: Every effort is made to have your procedure done on time. Please understand that emergencies sometimes delay scheduled procedures.  2. Diet: Do not eat or drink anything after midnight before  3. Labs:   4. Medication instructions in preparation for your procedure:   *For reference purposes while preparing patient instructions.   Delete this med list prior to printing instructions for patient.*   On the morning of your procedure, take your  Aspirin 81 mg and any morning medicines NOT listed above.  You may use sips of water.  5. Plan for one night stay--bring personal belongings. 6. Bring a current list of your medications and current insurance cards. 7. You MUST have a responsible person to drive you home. 8. Someone MUST be with you the first 24 hours after you arrive home or your discharge will be delayed. 9. Please wear clothes that are easy to get on and off and wear slip-on shoes.  Thank you for allowing [place] to care for you!   --  Invasive Cardiovascular services  Follow-Up: Your physician wants you to follow-up in: 1 month with Dr. Saunders Revel.    You will receive a reminder letter in the mail two months in advance. If you don't receive a letter, please call our office to schedule the follow-up appointment.  Thank you for choosing [facility]!!    Any Other Special Instructions Will Be Listed Below (If Applicable).

## 2017-06-19 NOTE — H&P (View-Only) (Signed)
New Outpatient Visit Date: 06/19/2017  Referring Provider: Antony Contras, MD [address] [place], [place] [address]  Chief Complaint: Chest and abdominal pain  HPI:  Cheryl Velez is a 77 y.o. female who is being seen today for the evaluation of chest and abdominal pain at the request of Dr. Moreen Fowler. She has a history of hypertension, hyperlipidemia, obstructive sleep apnea, hypothyroidism, osteoarthritis, and PTSD. She was evaluated by Dr. Moreen Fowler earlier this month, at which time she reported 10 days of mid epigastric and lower chest discomfort with accompanying nausea. She also reported a chronic cough and possible "flulike symptoms." Chest and epigastric discomfort were felt to be most consistent with GERD. However, new heart murmur was detected on exam, prompting referral for cardiology assessment.  Today, Cheryl Velez reports feeling better. However, she is concerned that her recent episodes of upper abdominal and lower chest pain could be related to her heart. She describes it as a sharp pain, occurring at any time. Pain was up to 4/10 in intensity and typically lasted up to 45 minutes at a time. Discomfort was not exertional or associated with other symptoms such as nausea, diaphoresis, and shortness of breath. She has not felt the pain for several days, but still has a vague uneasy feeling in her upper abdomen. She noted a more pronounced episode in 04/21/2023, at which time she also experienced nausea and double vision for a few minutes.  Cheryl Velez was evaluated in 2014 for chest pain, which time myocardial perfusion stress test was normal. However, Cheryl Velez reports that her more recent discomfort is different than what she experienced at that time. She also reports that she had a heart catheterization in the 1990s and believes that this was normal.  --------------------------------------------------------------------------------------------------  Cardiovascular History &  Procedures: Cardiovascular Problems:  Atypical chest pain  Risk Factors:  Hypertension, hyperlipidemia, obesity, and age greater than 24  Cath/PCI:  None  CV Surgery:  None  EP Procedures and Devices:  None  Non-Invasive Evaluation(s):  Exercise MPI (01/08/13): No ischemia or scar. LVEF 61%.  Recent CV Pertinent Labs: Lab Results  Component Value Date   CHOL 171 01/08/2013   HDL 38 (L) 01/08/2013   LDLCALC 90 01/08/2013   TRIG 215 (H) 01/08/2013   CHOLHDL 4.5 01/08/2013   K 3.9 02/18/2017   BUN 25 (H) 02/18/2017   CREATININE 0.94 02/18/2017    --------------------------------------------------------------------------------------------------  Past Medical History:  Diagnosis Date  . Adenomatous colon polyp   . Arthritis   . Chest pain 01/06/2013   Chest pain   . GI (gastrointestinal bleed)    [name], ADENOMATOUS POLYP 6/14, 3 YEAR FOLLOW UP, 9/17, 3 YEAR FOLLOW UP.  Cheryl Velez Hearing loss    BIATERAL HEARING AIDS, [name]  . Hypercholesterolemia   . Hyperlipidemia   . Hypertension    DX 8/16  . Hypothyroidism   . OSA (obstructive sleep apnea)    (PSG 03/21/12 ESS 6, AHI 14/hr REM 44/hr, RDI SAME, O2 min 77% APAP STARTED 12/15), [name]  . Osteoarthritis    KNEES, HANDS HAS SEEN [name]  . PTSD (post-traumatic stress disorder)   . Thyroid disease     Past Surgical History:  Procedure Laterality Date  . CARDIAC CATHETERIZATION    . KNEE SURGERY      Current Meds  Medication Sig  . acetaminophen (TYLENOL) 325 MG tablet Take 325 mg by mouth every 6 (six) hours as needed for headache.  . albuterol (PROAIR  HFA) 108 (90 Base) MCG/ACT inhaler Inhale 1-2 puffs into the lungs every 4 (four) hours as needed for wheezing or shortness of breath.  Cheryl Velez aspirin EC 81 MG tablet Take 81 mg by mouth daily.  Cheryl Velez b complex vitamins tablet Take 1 tablet by mouth daily.  . calcium-vitamin D (OSCAL WITH D) 500-200 MG-UNIT per tablet Take 1 tablet by mouth daily  with breakfast.  . fenofibrate 160 MG tablet Take 160 mg by mouth daily.  . hyoscyamine ([name]) 0.125 MG tablet Take 0.125 mg by mouth every 4 (four) hours as needed for cramping.  Cheryl Velez levothyroxine (SYNTHROID, LEVOTHROID) 75 MCG tablet Take 75 mcg by mouth daily before breakfast.  . losartan (COZAAR) 100 MG tablet Take 100 mg by mouth daily.  . Multiple Vitamin (MULTIVITAMIN WITH MINERALS) TABS tablet Take 1 tablet by mouth daily.  . naproxen sodium (ANAPROX) 220 MG tablet Take 440 mg by mouth daily as needed (pain).  Cheryl Velez VITAMIN E PO Take 1 tablet by mouth daily.    Allergies: Shellfish allergy; Lipitor [atorvastatin]; Paxil [paroxetine hcl]; Latex; Nickel; and Other  Social History   Socioeconomic History  . Marital status: Married    Spouse name: Cheryl Velez  . Number of children: 3  . Years of education: Not on file  . Highest education level: Not on file  Social Needs  . Financial resource strain: Not on file  . Food insecurity - worry: Not on file  . Food insecurity - inability: Not on file  . Transportation needs - medical: Not on file  . Transportation needs - non-medical: Not on file  Occupational History  . Occupation: RETIRED  Tobacco Use  . Smoking status: Never Smoker  . Smokeless tobacco: Never Used  Substance and Sexual Activity  . Alcohol use: No  . Drug use: No  . Sexual activity: Not on file  Other Topics Concern  . Not on file  Social History Narrative  . Not on file    Family History  Problem Relation Age of Onset  . Stroke Mother   . Hypertension Mother   . Colon polyps Mother   . Suicidality Sister   . Hypertension Brother 47  . Heart attack Brother   . Hypertension Father   . CAD Father   . Cirrhosis Father   . CAD Maternal Grandmother   . CAD Maternal Grandfather   . Breast cancer Paternal Grandmother   . CAD Paternal Grandmother   . CAD Paternal Grandfather   . Heart Problems Brother 62  . Diabetes Brother 60  . Hypertension Brother   .  Heart Problems Brother   . Hypertension Sister   . Obesity Sister   . Diverticulitis Sister   . Hypertension Sister   . Gout Sister   . Alcohol abuse Sister   . Polycythemia Sister   . Other Brother        MURDERED  . Hypertension Brother   . CVA Brother   . Blindness Brother     Review of Systems: A 12-system review of systems was performed and was negative except as noted in the HPI.  --------------------------------------------------------------------------------------------------  Physical Exam: BP (!) 150/68   Pulse 81   Ht 5' 4.5" (1.638 m)   Wt 211 lb 12.8 oz (96.1 kg)   SpO2 93%   BMI 35.79 kg/m   General:  Obese woman, seated comfortably in the exam room. HEENT: No conjunctival pallor or scleral icterus. Moist mucous membranes. OP clear. Neck: Supple without lymphadenopathy, thyromegaly,  JVD, or HJR. No carotid bruit. Lungs: Normal work of breathing. Clear to auscultation bilaterally without wheezes or crackles. Heart: Regular rate and rhythm with 1/6 systolic murmur loudest at the left upper sternal border. No rubs or gallops. Unable to assess PMI due to body habitus. Abd: Bowel sounds present. Soft, NT/ND. Unable to assess HSM due to body habitus. Ext: No lower extremity edema. Radial, PT, and DP pulses are 2+ bilaterally Skin: Warm and dry without rash. Neuro: CNIII-XII intact. Strength and fine-touch sensation intact in upper and lower extremities bilaterally. Psych: Normal mood and affect.  EKG:  Normal sinus rhythm with LVH and ST/T changes in the inferior and anterolateral leads. ST/T changes are more pronounced than on prior tracings.  Lab Results  Component Value Date   WBC 10.6 (H) 02/18/2017   HGB 12.5 02/18/2017   HCT 37.0 02/18/2017   MCV 88.9 02/18/2017   PLT 298 02/18/2017    Lab Results  Component Value Date   NA 139 02/18/2017   K 3.9 02/18/2017   CL 109 02/18/2017   CO2 21 (L) 02/18/2017   BUN 25 (H) 02/18/2017   CREATININE 0.94  02/18/2017   GLUCOSE 106 (H) 02/18/2017   ALT 21 05/03/2016    Lab Results  Component Value Date   CHOL 171 01/08/2013   HDL 38 (L) 01/08/2013   LDLCALC 90 01/08/2013   TRIG 215 (H) 01/08/2013   CHOLHDL 4.5 01/08/2013   --------------------------------------------------------------------------------------------------  ASSESSMENT AND PLAN: Chest and epigastric pain, concerning for unstable angina Ms. Moore reports episodic epigastric and lower chest pain over the last few weeks, which has actually improved since being seen by Dr. Moreen Fowler. Her exam today is notable for a soft systolic murmur but otherwise no significant abnormalities. However, her EKG shows prominent ST/T changes that are more pronounced than on prior tracings. Cardiac risk factors include hypertension, hyperlipidemia, obesity, and age. While the EKG findings certainly could be due to LVH, given her new symptoms, I feel that it is important to exclude obstructive CAD. We have discussed noninvasive testing versus cardiac catheterization and have agreed to proceed directly to catheterization. I have reviewed the risks, indications, and alternatives to cardiac catheterization, possible angioplasty, and stenting with the patient. Risks include but are not limited to bleeding, infection, vascular injury, stroke, myocardial infection, arrhythmia, kidney injury, radiation-related injury in the case of prolonged fluoroscopy use, emergency cardiac surgery, and death. The patient understands the risks of serious complication is 1-2 in 2440 with diagnostic cardiac cath and 1-2% or less with angioplasty/stenting. She is scheduled for catheterization with Dr. Burt Knack on 06/23/17. In the meantime, we have agreed to start metoprolol tartrate 12.5 mg twice a day for anti-anginal therapy. She should continue aspirin 81 mg daily. If she has recurrent chest pain in the meantime, I advised Ms. Windt to seek immediate medical attention. We will obtain CBC,  BMP, and PT/INR today in anticipation of catheterization.  Hypertension Blood pressure mildly elevated today. As above, we will add a low-dose metoprolol.  Heart murmur Soft systolic murmur appreciated on exam today. There are no signs or symptoms of heart failure. We will consider further assessment with echocardiogram following aforementioned catheterization.  Follow-up: Return to clinic in 1 month.  [name], MD 06/19/2017 12:11 PM

## 2017-06-20 ENCOUNTER — Encounter: Payer: Self-pay | Admitting: Internal Medicine

## 2017-06-20 DIAGNOSIS — I10 Essential (primary) hypertension: Secondary | ICD-10-CM | POA: Insufficient documentation

## 2017-06-20 DIAGNOSIS — R011 Cardiac murmur, unspecified: Secondary | ICD-10-CM | POA: Insufficient documentation

## 2017-06-20 LAB — CBC WITH DIFFERENTIAL/PLATELET
BASOS: 1 %
Basophils Absolute: 0.1 10*3/uL (ref 0.0–0.2)
EOS (ABSOLUTE): 0.2 10*3/uL (ref 0.0–0.4)
EOS: 3 %
HEMOGLOBIN: 12.5 g/dL (ref 11.1–15.9)
Hematocrit: 37.3 % (ref 34.0–46.6)
IMMATURE GRANS (ABS): 0 10*3/uL (ref 0.0–0.1)
IMMATURE GRANULOCYTES: 0 %
LYMPHS: 39 %
Lymphocytes Absolute: 3.1 10*3/uL (ref 0.7–3.1)
MCH: 29.9 pg (ref 26.6–33.0)
MCHC: 33.5 g/dL (ref 31.5–35.7)
MCV: 89 fL (ref 79–97)
MONOCYTES: 10 %
Monocytes Absolute: 0.8 10*3/uL (ref 0.1–0.9)
NEUTROS PCT: 47 %
Neutrophils Absolute: 3.7 10*3/uL (ref 1.4–7.0)
PLATELETS: 331 10*3/uL (ref 150–379)
RBC: 4.18 x10E6/uL (ref 3.77–5.28)
RDW: 13.4 % (ref 12.3–15.4)
WBC: 7.9 10*3/uL (ref 3.4–10.8)

## 2017-06-20 LAB — BASIC METABOLIC PANEL
BUN / CREAT RATIO: 21 (ref 12–28)
BUN: 17 mg/dL (ref 8–27)
CO2: 23 mmol/L (ref 20–29)
CREATININE: 0.81 mg/dL (ref 0.57–1.00)
Calcium: 10.6 mg/dL — ABNORMAL HIGH (ref 8.7–10.3)
Chloride: 106 mmol/L (ref 96–106)
GFR, EST AFRICAN AMERICAN: 82 mL/min/{1.73_m2} (ref >59)
GFR, EST NON AFRICAN AMERICAN: 71 mL/min/{1.73_m2} (ref >59)
Glucose: 76 mg/dL (ref 65–99)
Potassium: 4.6 mmol/L (ref 3.5–5.2)
Sodium: 142 mmol/L (ref 134–144)

## 2017-06-20 LAB — PROTIME-INR
INR: 1 (ref 0.8–1.2)
Prothrombin Time: 10.1 s (ref 9.1–12.0)

## 2017-06-22 ENCOUNTER — Telehealth: Payer: Self-pay | Admitting: *Deleted

## 2017-06-22 NOTE — Telephone Encounter (Signed)
Pt contacted pre-catheterization scheduled at [facility] for: Tuesday February 26,2019 8AM Verified arrival time and place: [place] at: 6AM Verified allergies listed in [place] states to her knowledge she has not had an allergic reaction to contrast.  Verified no diabetes medications.  Verified AM meds can be  taken pre-cath with sip of water including: ASA 81 mg of cath   Confirmed patient has responsible person to drive home post procedure and observe patient for 24 hours: yes

## 2017-06-23 ENCOUNTER — Ambulatory Visit (HOSPITAL_COMMUNITY)
Admission: RE | Disposition: A | Discharge status: Home / Self Care | Payer: Self-pay | Source: Ambulatory Visit | Attending: Cardiovascular Disease

## 2017-06-23 ENCOUNTER — Ambulatory Visit (HOSPITAL_COMMUNITY)
Admission: RE | Admit: 2017-06-23 | Discharge: 2017-06-23 | Disposition: A | Discharge status: Home / Self Care | Payer: Medicare Other | Source: Ambulatory Visit | Attending: Cardiovascular Disease | Admitting: Cardiovascular Disease

## 2017-06-23 ENCOUNTER — Encounter (HOSPITAL_COMMUNITY): Payer: Self-pay | Admitting: Cardiovascular Disease

## 2017-06-23 DIAGNOSIS — Z91040 Latex allergy status: Secondary | ICD-10-CM | POA: Insufficient documentation

## 2017-06-23 DIAGNOSIS — Z823 Family history of stroke: Secondary | ICD-10-CM | POA: Diagnosis not present

## 2017-06-23 DIAGNOSIS — F4310 Post-traumatic stress disorder, unspecified: Secondary | ICD-10-CM | POA: Insufficient documentation

## 2017-06-23 DIAGNOSIS — E7800 Pure hypercholesterolemia, unspecified: Secondary | ICD-10-CM | POA: Diagnosis not present

## 2017-06-23 DIAGNOSIS — M1990 Unspecified osteoarthritis, unspecified site: Secondary | ICD-10-CM | POA: Diagnosis not present

## 2017-06-23 DIAGNOSIS — E669 Obesity, unspecified: Secondary | ICD-10-CM | POA: Diagnosis not present

## 2017-06-23 DIAGNOSIS — R079 Chest pain, unspecified: Secondary | ICD-10-CM | POA: Diagnosis present

## 2017-06-23 DIAGNOSIS — Z8249 Family history of ischemic heart disease and other diseases of the circulatory system: Secondary | ICD-10-CM | POA: Diagnosis not present

## 2017-06-23 DIAGNOSIS — I10 Essential (primary) hypertension: Secondary | ICD-10-CM | POA: Diagnosis not present

## 2017-06-23 DIAGNOSIS — E039 Hypothyroidism, unspecified: Secondary | ICD-10-CM | POA: Insufficient documentation

## 2017-06-23 DIAGNOSIS — G4733 Obstructive sleep apnea (adult) (pediatric): Secondary | ICD-10-CM | POA: Diagnosis not present

## 2017-06-23 DIAGNOSIS — Z7982 Long term (current) use of aspirin: Secondary | ICD-10-CM | POA: Diagnosis not present

## 2017-06-23 DIAGNOSIS — I200 Unstable angina: Secondary | ICD-10-CM

## 2017-06-23 DIAGNOSIS — Z6835 Body mass index (BMI) 35.0-35.9, adult: Secondary | ICD-10-CM | POA: Diagnosis not present

## 2017-06-23 DIAGNOSIS — H9190 Unspecified hearing loss, unspecified ear: Secondary | ICD-10-CM | POA: Insufficient documentation

## 2017-06-23 DIAGNOSIS — R011 Cardiac murmur, unspecified: Secondary | ICD-10-CM | POA: Diagnosis not present

## 2017-06-23 DIAGNOSIS — R0789 Other chest pain: Secondary | ICD-10-CM | POA: Diagnosis not present

## 2017-06-23 HISTORY — PX: LEFT HEART CATH AND CORONARY ANGIOGRAPHY: CATH118249

## 2017-06-23 SURGERY — LEFT HEART CATH AND CORONARY ANGIOGRAPHY
Anesthesia: LOCAL

## 2017-06-23 MED ORDER — MIDAZOLAM HCL 2 MG/2ML IJ SOLN
INTRAMUSCULAR | Status: AC
  Filled 2017-06-23: qty 2

## 2017-06-23 MED ORDER — HEPARIN (PORCINE) IN NACL 2-0.9 UNIT/ML-% IJ SOLN
INTRAMUSCULAR | Status: AC
  Filled 2017-06-23: qty 1000

## 2017-06-23 MED ORDER — SODIUM CHLORIDE 0.9 % IV SOLN: INTRAVENOUS | Status: DC

## 2017-06-23 MED ORDER — SODIUM CHLORIDE 0.9% FLUSH: 3.0000 mL | Freq: Two times a day (BID) | INTRAVENOUS | Status: DC

## 2017-06-23 MED ORDER — SODIUM CHLORIDE 0.9% FLUSH: 3.0000 mL | INTRAVENOUS | Status: DC | PRN

## 2017-06-23 MED ORDER — HEPARIN (PORCINE) IN NACL 2-0.9 UNIT/ML-% IJ SOLN
INTRAMUSCULAR | Status: AC | PRN
  Administered 2017-06-23 (×2): 500 mL

## 2017-06-23 MED ORDER — SODIUM CHLORIDE 0.9 % IV SOLN: 250.0000 mL | INTRAVENOUS | Status: DC | PRN

## 2017-06-23 MED ORDER — HEPARIN SODIUM (PORCINE) 1000 UNIT/ML IJ SOLN
INTRAMUSCULAR | Status: DC | PRN
  Administered 2017-06-23: 4000 [IU] via INTRAVENOUS

## 2017-06-23 MED ORDER — LIDOCAINE HCL (PF) 1 % IJ SOLN
INTRAMUSCULAR | Status: AC
  Filled 2017-06-23: qty 30

## 2017-06-23 MED ORDER — ASPIRIN 81 MG PO CHEW: 81.0000 mg | CHEWABLE_TABLET | ORAL | Status: DC

## 2017-06-23 MED ORDER — VERAPAMIL HCL 2.5 MG/ML IV SOLN
INTRAVENOUS | Status: AC
  Filled 2017-06-23: qty 2

## 2017-06-23 MED ORDER — MIDAZOLAM HCL 2 MG/2ML IJ SOLN
INTRAMUSCULAR | Status: DC | PRN
  Administered 2017-06-23: 2 mg via INTRAVENOUS

## 2017-06-23 MED ORDER — SODIUM CHLORIDE 0.9 % WEIGHT BASED INFUSION: 1.0000 mL/kg/h | INTRAVENOUS | Status: DC

## 2017-06-23 MED ORDER — FENTANYL CITRATE (PF) 100 MCG/2ML IJ SOLN
INTRAMUSCULAR | Status: DC | PRN
  Administered 2017-06-23: 25 ug via INTRAVENOUS

## 2017-06-23 MED ORDER — FENTANYL CITRATE (PF) 100 MCG/2ML IJ SOLN
INTRAMUSCULAR | Status: AC
  Filled 2017-06-23: qty 2

## 2017-06-23 MED ORDER — IOPAMIDOL (ISOVUE-370) INJECTION 76%
INTRAVENOUS | Status: DC | PRN
  Administered 2017-06-23: 40 mL

## 2017-06-23 MED ORDER — SODIUM CHLORIDE 0.9 % WEIGHT BASED INFUSION
3.0000 mL/kg/h | INTRAVENOUS | Status: AC
  Administered 2017-06-23: 3 mL/kg/h via INTRAVENOUS

## 2017-06-23 MED ORDER — HEPARIN SODIUM (PORCINE) 1000 UNIT/ML IJ SOLN
INTRAMUSCULAR | Status: AC
  Filled 2017-06-23: qty 1

## 2017-06-23 MED ORDER — VERAPAMIL HCL 2.5 MG/ML IV SOLN
INTRAVENOUS | Status: DC | PRN
Start: 2017-06-23 — End: 2017-06-23
  Administered 2017-06-23: 10 mL via INTRA_ARTERIAL

## 2017-06-23 MED ORDER — LIDOCAINE HCL (PF) 1 % IJ SOLN
INTRAMUSCULAR | Status: DC | PRN
  Administered 2017-06-23: 2 mL

## 2017-06-23 SURGICAL SUPPLY — 12 items

## 2017-06-23 NOTE — Discharge Instructions (Signed)

## 2017-06-23 NOTE — Interval H&P Note (Signed)
History and Physical Interval Note:  06/23/2017 8:08 AM  Cheryl Velez  has presented today for surgery, with the diagnosis of atypical angina  The various methods of treatment have been discussed with the patient and family. After consideration of risks, benefits and other options for treatment, the patient has consented to  Procedure(s): LEFT HEART CATH AND CORONARY ANGIOGRAPHY (N/A) as a surgical intervention .  The patient's history has been reviewed, patient examined, no change in status, stable for surgery.  I have reviewed the patient's chart and labs.  Questions were answered to the patient's satisfaction.     [name]

## 2017-06-24 ENCOUNTER — Telehealth: Payer: Self-pay | Admitting: Internal Medicine

## 2017-06-24 MED FILL — Heparin Sodium (Porcine) 2 Unit/ML in Sodium Chloride 0.9%: INTRAMUSCULAR | Qty: 1000 | Status: AC

## 2017-06-24 NOTE — Telephone Encounter (Signed)
Pt wants to know if she needs to continue to take metoprolol tartrate 12.5 my BID. This medication was prescribed prior the cardiac cath which she had yesterday 2/26 th. Pt is aware that when she was D/C from the hospital the metoprolol was included on the list of medications she is to continue. Pt has an appointment with Dr. END on 07/20/17. Dr. Saunders Revel will review the medications she is taken and decided if she still need to continue this medication. Pt verbalized understanding.

## 2017-06-24 NOTE — Telephone Encounter (Signed)
Cheryl Velez is calling because she has a question about whether or not Dr.End wants her to stay on the medication( Metoprolol) in which he prescribe for her before the heart cath she had on yesterday?

## 2017-06-26 ENCOUNTER — Telehealth: Payer: Self-pay

## 2017-06-26 DIAGNOSIS — R079 Chest pain, unspecified: Secondary | ICD-10-CM

## 2017-06-26 NOTE — Telephone Encounter (Signed)
Spoke with patient and informed her of cath results and let her know that we would be calling with an Echo date.Marland KitchenMarland Kitchen

## 2017-07-06 ENCOUNTER — Ambulatory Visit (HOSPITAL_COMMUNITY): Payer: Medicare Other | Attending: Cardiovascular Disease

## 2017-07-06 ENCOUNTER — Other Ambulatory Visit: Payer: Self-pay

## 2017-07-06 DIAGNOSIS — G4733 Obstructive sleep apnea (adult) (pediatric): Secondary | ICD-10-CM | POA: Diagnosis not present

## 2017-07-06 DIAGNOSIS — I071 Rheumatic tricuspid insufficiency: Secondary | ICD-10-CM | POA: Diagnosis not present

## 2017-07-06 DIAGNOSIS — I10 Essential (primary) hypertension: Secondary | ICD-10-CM | POA: Insufficient documentation

## 2017-07-06 DIAGNOSIS — E785 Hyperlipidemia, unspecified: Secondary | ICD-10-CM | POA: Insufficient documentation

## 2017-07-06 DIAGNOSIS — E039 Hypothyroidism, unspecified: Secondary | ICD-10-CM | POA: Insufficient documentation

## 2017-07-06 DIAGNOSIS — R079 Chest pain, unspecified: Secondary | ICD-10-CM | POA: Insufficient documentation

## 2017-07-10 ENCOUNTER — Encounter: Payer: Self-pay | Admitting: *Deleted

## 2017-07-16 DIAGNOSIS — G4733 Obstructive sleep apnea (adult) (pediatric): Secondary | ICD-10-CM | POA: Diagnosis not present

## 2017-07-20 ENCOUNTER — Ambulatory Visit (INDEPENDENT_AMBULATORY_CARE_PROVIDER_SITE_OTHER): Payer: Medicare Other | Admitting: Internal Medicine

## 2017-07-20 ENCOUNTER — Encounter: Payer: Self-pay | Admitting: Internal Medicine

## 2017-07-20 VITALS — BP 138/62 | HR 58 | Ht 64.0 in | Wt 214.2 lb

## 2017-07-20 DIAGNOSIS — I10 Essential (primary) hypertension: Secondary | ICD-10-CM

## 2017-07-20 DIAGNOSIS — I422 Other hypertrophic cardiomyopathy: Secondary | ICD-10-CM | POA: Diagnosis not present

## 2017-07-20 DIAGNOSIS — R0789 Other chest pain: Secondary | ICD-10-CM

## 2017-07-20 DIAGNOSIS — R002 Palpitations: Secondary | ICD-10-CM

## 2017-07-20 NOTE — Patient Instructions (Addendum)
Medication Instructions:  Your physician recommends that you continue on your current medications as directed. Please refer to the Current Medication list given to you today.  -- If you need a refill on your cardiac medications before your next appointment, please call your pharmacy. --  Labwork: None ordered  Testing/Procedures: Your physician has recommended that you wear a 48 HOUR holter monitor. Holter monitors are medical devices that record the heart's electrical activity. Doctors most often use these monitors to diagnose arrhythmias. Arrhythmias are problems with the speed or rhythm of the heartbeat. The monitor is a small, portable device. You can wear one while you do your normal daily activities. This is usually used to diagnose what is causing palpitations/syncope (passing out).   Follow-Up: Your physician wants you to follow-up in: 3 MONTHS with Dr. Saunders Revel.    Thank you for choosing [facility]!!    Any Other Special Instructions Will Be Listed Below (If Applicable).

## 2017-07-20 NOTE — Progress Notes (Signed)
Follow-up Outpatient Visit Date: 07/20/2017  Primary Care Provider: [name] [id number]  Chief Complaint: Chest pain and palpitations  HPI:  Cheryl Velez is a 77 y.o. year-old female with history of hypertension, hyperlipidemia, obstructive sleep apnea, hypothyroidism, osteoarthritis, and PTSD, who presents for follow-up of chest and abdominal pain. I met Cheryl Velez on 06/19/17, at which time she reported lower chest and epigastric pain accompanied by nausea. We proceeded with cardiac catheterization given her symptoms and an abnormal EKG, which did not show any significant CAD. LVEF was hyperdynamic with suggestion of apical hypertrophy.  Today, Cheryl Velez reports that her chest pain is less frequent. It seems to correspond to palpitations that she describes as fluttering in the chest. She often feels like she needs to cough with the palpitations. The episodes usually last about 5 seconds and are "seasonal." Currently, she experiences them most days of the week.  She denies shortness of breath as well as lightheadedness, orthopnea, PND.  She has noted some edema in her hands and fingers but denies lower extremity swelling.  She is thinking of seeing a nutritionist to help with weight loss, as she is frustrated by inability to lose weight.  She notes that her thyroid is being monitored closely by her PCP.  --------------------------------------------------------------------------------------------------  Cardiovascular History & Procedures: Cardiovascular Problems:  Atypical chest pain  Apical hypertrophic cardiomyopathy  Risk Factors:  Hypertension, hyperlipidemia, obesity, and age greater than 36  Cath/PCI:  LHC (06/23/17): Minimal luminal irregularities involving the LMCA, LAD, and RCA. Hyperdynamic LVEF with suggestion of apical hypertrophy. LVEDP 24-26 mmHg.  CV Surgery:  None  EP Procedures and Devices:  None  Non-Invasive  Evaluation(s):  TTE (07/06/17): Normal LV size with moderate LVH. LVEF 55-60% with grade 2 diastolic dysfunction. Trivial aortic regurgitation. Mild left atrial enlargement. Normal RV size and function. Upper normal PA pressure.  Exercise MPI (01/08/13): No ischemia or scar. LVEF 61%.   Recent CV Pertinent Labs: Lab Results  Component Value Date   CHOL 171 01/08/2013   HDL 38 (L) 01/08/2013   LDLCALC 90 01/08/2013   TRIG 215 (H) 01/08/2013   CHOLHDL 4.5 01/08/2013   INR 1.0 06/19/2017   K 4.6 06/19/2017   BUN 17 06/19/2017   CREATININE 0.81 06/19/2017    Past medical and surgical history were reviewed and updated in EPIC.  Current Meds  Medication Sig  . aspirin EC 81 MG tablet Take 81 mg by mouth at bedtime.   Marland Kitchen b complex vitamins tablet Take 1 tablet by mouth at bedtime. SUPER B COMPLEX  . Calcium Carb-Cholecalciferol (CALCIUM 600+D3 PO) Take 1 tablet by mouth at bedtime.  . Cholecalciferol (VITAMIN D3) 2000 units TABS Take 2,000 Units by mouth at bedtime.  Marland Kitchen dexlansoprazole (DEXILANT) 60 MG capsule Take 60 mg by mouth every evening.  . fenofibrate 160 MG tablet Take 160 mg by mouth at bedtime.   . hyoscyamine ([name]) 0.125 MG tablet Take 0.125 mg by mouth every 4 (four) hours as needed for cramping.  Marland Kitchen levothyroxine (SYNTHROID, LEVOTHROID) 75 MCG tablet Take 75 mcg by mouth daily at 2 am.   . losartan (COZAAR) 100 MG tablet Take 100 mg by mouth at bedtime.   . metoprolol tartrate (LOPRESSOR) 25 MG tablet Take 0.5 tablets (12.5 mg total) by mouth 2 (two) times daily.  . Multiple Vitamin (MULTIVITAMIN WITH MINERALS) TABS tablet Take 1 tablet by mouth at bedtime.   . naproxen sodium (ANAPROX) 550  MG tablet Take 550 mg by mouth 2 (two) times daily as needed (FOR PAIN.).    Allergies: Lipitor [atorvastatin]; Paxil [paroxetine hcl]; Shellfish allergy; Latex; Nickel; and Other  Social History   Socioeconomic History  . Marital status: Married    Spouse name: [name]  . Number  of children: 3  . Years of education: Not on file  . Highest education level: Not on file  Occupational History  . Occupation: RETIRED  Social Needs  . Financial resource strain: Not on file  . Food insecurity:    Worry: Not on file    Inability: Not on file  . Transportation needs:    Medical: Not on file    Non-medical: Not on file  Tobacco Use  . Smoking status: Never Smoker  . Smokeless tobacco: Never Used  Substance and Sexual Activity  . Alcohol use: No  . Drug use: No  . Sexual activity: Not on file  Lifestyle  . Physical activity:    Days per week: Not on file    Minutes per session: Not on file  . Stress: Not on file  Relationships  . Social connections:    Talks on phone: Not on file    Gets together: Not on file    Attends religious service: Not on file    Active member of club or organization: Not on file    Attends meetings of clubs or organizations: Not on file    Relationship status: Not on file  . Intimate partner violence:    Fear of current or ex partner: Not on file    Emotionally abused: Not on file    Physically abused: Not on file    Forced sexual activity: Not on file  Other Topics Concern  . Not on file  Social History Narrative  . Not on file    Family History  Problem Relation Age of Onset  . Stroke Mother   . Hypertension Mother   . Colon polyps Mother   . Suicidality Sister   . Hypertension Brother 42  . Heart attack Brother   . Hypertension Father   . CAD Father   . Cirrhosis Father   . CAD Maternal Grandmother   . CAD Maternal Grandfather   . Breast cancer Paternal Grandmother   . CAD Paternal Grandmother   . CAD Paternal Grandfather   . Heart Problems Brother 53  . Diabetes Brother 31  . Hypertension Brother   . Heart Problems Brother   . Hypertension Sister   . Obesity Sister   . Diverticulitis Sister   . Hypertension Sister   . Gout Sister   . Alcohol abuse Sister   . Polycythemia Sister   . Other Brother         MURDERED  . Hypertension Brother   . CVA Brother   . Blindness Brother     Review of Systems: A 12-system review of systems was performed and was negative except as noted in the HPI.  --------------------------------------------------------------------------------------------------  Physical Exam: BP 138/62   Pulse (!) 58   Ht _0  (1.626 m)   Wt 214 lb 3.2 oz (97.2 kg)   SpO2 95%   BMI 36.77 kg/m   General: Obese woman, seated comfortably in the exam room. HEENT: No conjunctival pallor or scleral icterus. Moist mucous membranes.  OP clear. Neck: Supple without lymphadenopathy, thyromegaly, JVD, or HJR. No carotid bruit. Lungs: Normal work of breathing. Clear to auscultation bilaterally without wheezes or crackles. Heart: Bradycardic  but regular without murmurs or rubs.  Unable to assess PMI due to body habitus.  Abd: Bowel sounds present. Soft, NT/ND.  Unable to assess HSM due to body habitus. Ext: No lower extremity edema. Radial, PT, and DP pulses are 2+ bilaterally.  The arteriotomy site is well-healed. Skin: Warm and dry without rash.  EKG: Sinus bradycardia with anterolateral and inferior T wave inversions.  Lab Results  Component Value Date   WBC 7.9 06/19/2017   HGB 12.5 06/19/2017   HCT 37.3 06/19/2017   MCV 89 06/19/2017   PLT 331 06/19/2017    Lab Results  Component Value Date   NA 142 06/19/2017   K 4.6 06/19/2017   CL 106 06/19/2017   CO2 23 06/19/2017   BUN 17 06/19/2017   CREATININE 0.81 06/19/2017   GLUCOSE 76 06/19/2017   ALT 21 05/03/2016    Lab Results  Component Value Date   CHOL 171 01/08/2013   HDL 38 (L) 01/08/2013   LDLCALC 90 01/08/2013   TRIG 215 (H) 01/08/2013   CHOLHDL 4.5 01/08/2013    --------------------------------------------------------------------------------------------------  ASSESSMENT AND PLAN: Hypertrophic cardiomyopathy Recent cath and echo findings are most suggestive of hypertrophic cardiomyopathy, likely  apical variant.  Cheryl Velez does not report any obstructive symptoms.  She does not have a murmur on exam today.  The apical HCM is compatible with changes noted on her EKG.  We have discussed the clinical variability in hypertrophic cardiomyopathy.  Given that she experiences palpitations, I think it is important to exclude ventricular arrhythmia.  We will obtain a 48-hour Holter monitor for further assessment.  Heart rate is well controlled today on low-dose metoprolol.  No medication changes at this time.  Atypical chest pain and palpitations I suspect her symptoms are most likely due to hypertrophic cardiomyopathy and tachyarrhythmias.  She likely has demand ischemia in the setting of elevated heart rates.  Given resting heart rate in the upper 50s today, I am hesitant to escalate metoprolol further.  As noted above, I will obtain an 48-hour Holter monitor to assess her palpitations further.  Hypertension  Blood pressure borderline elevated today.  I will not make any medication changes at this time.  Follow-up: Return to clinic in 3 months.  [name], MD 07/20/2017 11:39 AM

## 2017-07-27 NOTE — Addendum Note (Signed)
Addended by: Briant Cedar on: 07/27/2017 03:18 PM   Modules accepted: Orders

## 2017-07-30 ENCOUNTER — Ambulatory Visit (INDEPENDENT_AMBULATORY_CARE_PROVIDER_SITE_OTHER): Payer: Medicare Other

## 2017-07-30 DIAGNOSIS — R002 Palpitations: Secondary | ICD-10-CM | POA: Diagnosis not present

## 2017-07-30 DIAGNOSIS — I422 Other hypertrophic cardiomyopathy: Secondary | ICD-10-CM | POA: Diagnosis not present

## 2017-08-03 DIAGNOSIS — R7303 Prediabetes: Secondary | ICD-10-CM | POA: Diagnosis not present

## 2017-08-03 DIAGNOSIS — E785 Hyperlipidemia, unspecified: Secondary | ICD-10-CM | POA: Diagnosis not present

## 2017-08-03 DIAGNOSIS — G4733 Obstructive sleep apnea (adult) (pediatric): Secondary | ICD-10-CM | POA: Diagnosis not present

## 2017-08-03 DIAGNOSIS — K219 Gastro-esophageal reflux disease without esophagitis: Secondary | ICD-10-CM | POA: Diagnosis not present

## 2017-08-03 DIAGNOSIS — Z1231 Encounter for screening mammogram for malignant neoplasm of breast: Secondary | ICD-10-CM | POA: Diagnosis not present

## 2017-08-03 DIAGNOSIS — I10 Essential (primary) hypertension: Secondary | ICD-10-CM | POA: Diagnosis not present

## 2017-08-03 DIAGNOSIS — R197 Diarrhea, unspecified: Secondary | ICD-10-CM | POA: Diagnosis not present

## 2017-08-06 IMAGING — CR DG SHOULDER 2+V*R*
3 series · 3 of 3 positions shown · non-contrast
Comparison: None.

CLINICAL DATA: Fall 1 week ago

EXAM:
RIGHT SHOULDER - 2+ VIEW

[view not recorded (1 of 3)]
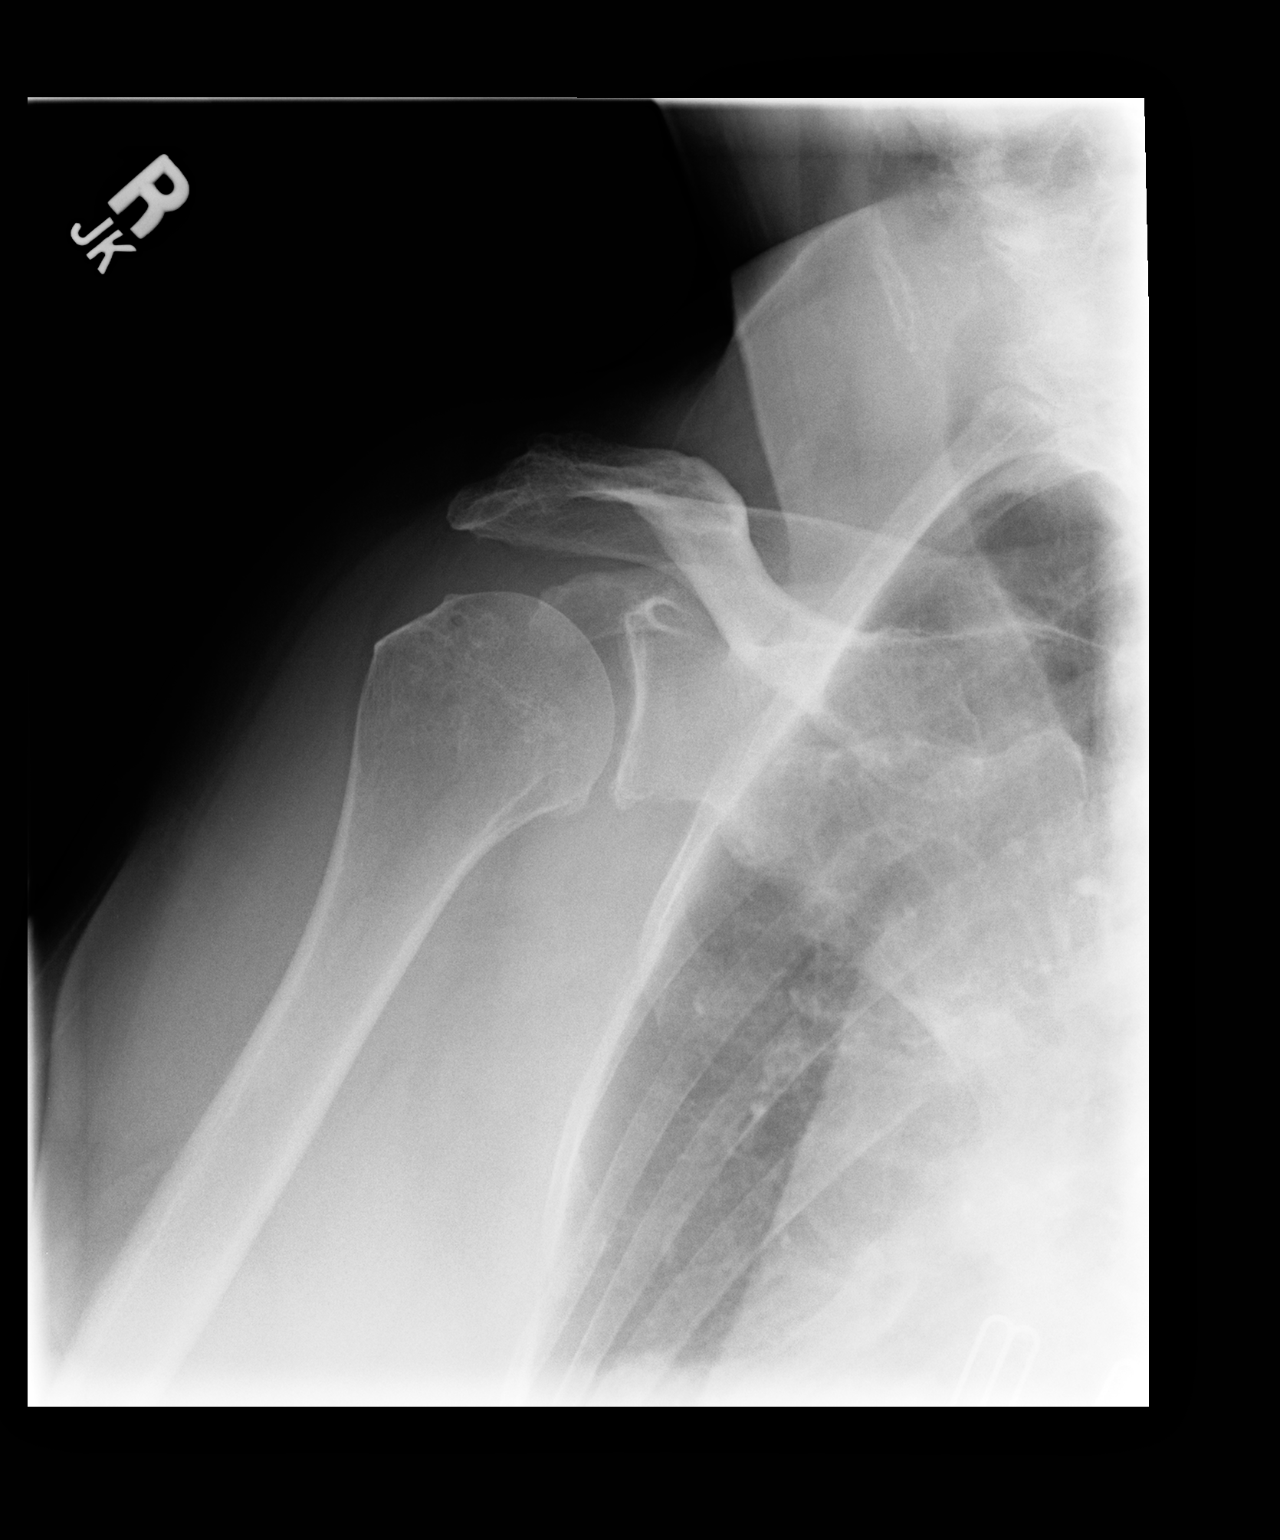

[view not recorded (2 of 3)]
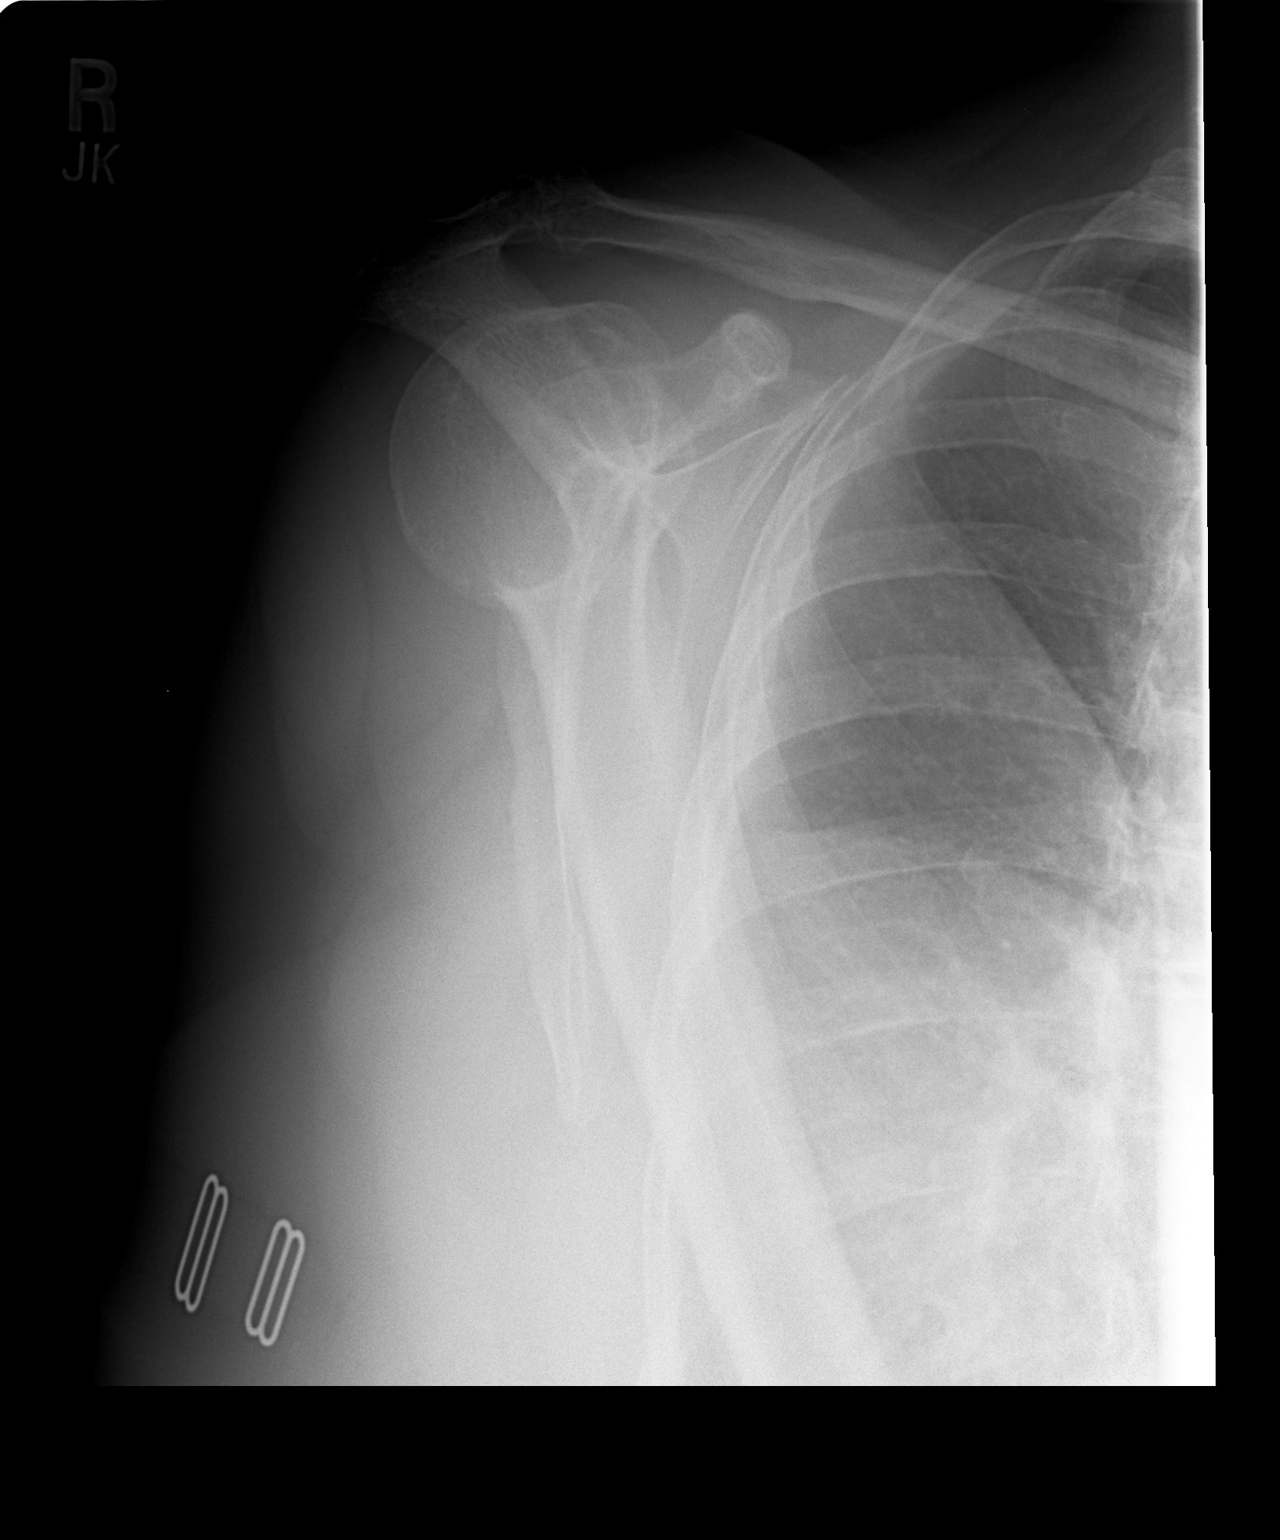

[view not recorded (3 of 3)]
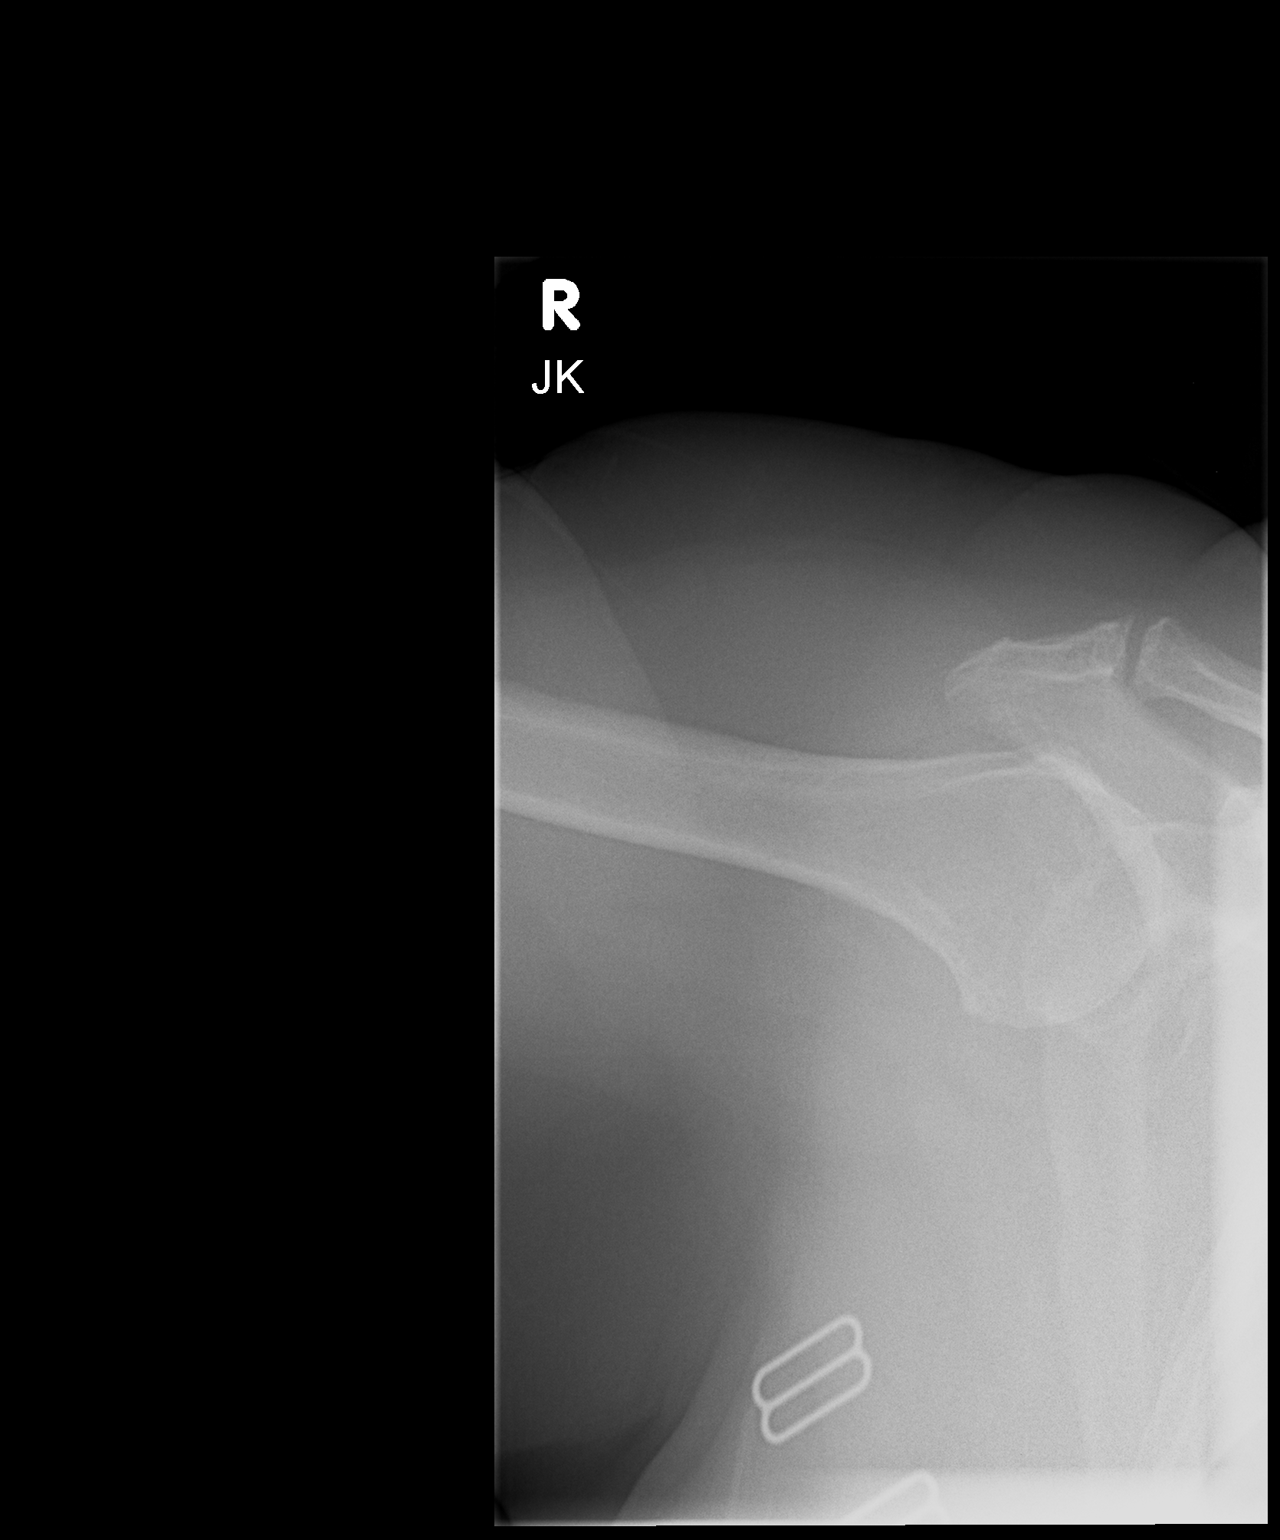

[3 of 3 positions shown; findings below may reference images not displayed]

FINDINGS: No acute fracture. No dislocation. Unremarkable soft tissues.
Moderate degenerative change of the AC joint.
IMPRESSION: No acute bony pathology.

## 2017-08-12 ENCOUNTER — Encounter: Payer: Self-pay | Admitting: Internal Medicine

## 2017-08-13 ENCOUNTER — Telehealth: Payer: Self-pay | Admitting: Internal Medicine

## 2017-08-13 DIAGNOSIS — E039 Hypothyroidism, unspecified: Secondary | ICD-10-CM | POA: Diagnosis not present

## 2017-08-13 DIAGNOSIS — E785 Hyperlipidemia, unspecified: Secondary | ICD-10-CM | POA: Diagnosis not present

## 2017-08-13 DIAGNOSIS — I10 Essential (primary) hypertension: Secondary | ICD-10-CM | POA: Diagnosis not present

## 2017-08-13 DIAGNOSIS — J4521 Mild intermittent asthma with (acute) exacerbation: Secondary | ICD-10-CM | POA: Diagnosis not present

## 2017-08-13 NOTE — Telephone Encounter (Signed)
Left patient message with preliminary results

## 2017-08-13 NOTE — Telephone Encounter (Signed)
New message  Pt husband verbalzied that she is calling for RN  He want results for the holtor monitor 07/30/2017

## 2017-08-18 ENCOUNTER — Ambulatory Visit (INDEPENDENT_AMBULATORY_CARE_PROVIDER_SITE_OTHER): Payer: Medicare Other | Admitting: Family Medicine

## 2017-08-18 ENCOUNTER — Encounter: Payer: Self-pay | Admitting: Family Medicine

## 2017-08-18 DIAGNOSIS — E669 Obesity, unspecified: Secondary | ICD-10-CM

## 2017-08-18 DIAGNOSIS — R7303 Prediabetes: Secondary | ICD-10-CM | POA: Diagnosis not present

## 2017-08-18 DIAGNOSIS — Z6835 Body mass index (BMI) 35.0-35.9, adult: Secondary | ICD-10-CM | POA: Diagnosis not present
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
## 2017-08-18 NOTE — Patient Instructions (Addendum)
-   Talk with Dr. Dema Severin about your reflux medication.  Is there a less expensive alternative to this one?  - Go to Self-compassion.org, and take the quiz.  Bring your overall and 6 sub-scores to follow-up appt for review.    - Peruse the website, and check out some of the videos and articles.    - Diarrhea:  Consider trying a probiotic, possibly VSL#3 (available at [facility]).    Diet Recommendations for Pre-Diabetes and Weight Management Carbohydrate includes starch, sugar, and fiber.  Of these, only sugar and starch raise blood glucose.  (Fiber is found in fruits, vegetables [especially skin, seeds, and stalks] and whole grains.)   Starchy (carb) foods: Bread, rice, pasta, potatoes, corn, cereal, grits, crackers, bagels, muffins, all baked goods.  (Fruit, milk, and yogurt also have carbohydrate, but most of these foods will not spike your blood sugar as most starchy foods will.)  A few fruits do cause high blood sugars; use small portions of bananas (limit to 1/2 at a time), grapes, watermelon, oranges, and most tropical fruits.   Protein foods: Meat, fish, poultry, eggs, dairy foods, and beans such as pinto and kidney beans (beans also provide carbohydrate).   1. Eat at least REAL 3 meals and 1-2 snacks per day. Never go more than 4-5 hours while awake without eating. Eat breakfast within the first hour of getting up.   2. Limit starchy foods to TWO per meal and ONE per snack. ONE portion of a starchy  food is equal to the following:   - ONE slice of bread (or its equivalent, such as half of a hamburger bun).   - 1/2 cup of a "scoopable" starchy food such as potatoes or rice.   - 15 grams of Total Carbohydrate as shown on food label.  3. Include at every meal: a protein food, a carb food, and vegetables and/or fruit.   - Obtain twice the volume of veg's as protein or carbohydrate foods for both lunch and dinner.   - Fresh or frozen veg's are best.   - Keep frozen veg's on hand for a quick vegetable  serving.      - Make a list of 7 dinner meals (using the form provided) that taste good, are relatively quick and easy to prepare, and that meet your nutritional needs.  Use this as a basis for shopping, so you can make one of these meals any time.  Bring your list to your follow-up appointment for review.    - Experiment with using the stationary bike in addition to walking on some days.

## 2017-09-15 ENCOUNTER — Encounter: Payer: Self-pay | Admitting: Family Medicine

## 2017-09-15 ENCOUNTER — Ambulatory Visit (INDEPENDENT_AMBULATORY_CARE_PROVIDER_SITE_OTHER): Payer: Medicare Other | Admitting: Family Medicine

## 2017-09-15 DIAGNOSIS — E039 Hypothyroidism, unspecified: Secondary | ICD-10-CM | POA: Diagnosis not present

## 2017-09-15 DIAGNOSIS — E785 Hyperlipidemia, unspecified: Secondary | ICD-10-CM | POA: Diagnosis not present

## 2017-09-15 DIAGNOSIS — E66812 Obesity, class 2: Secondary | ICD-10-CM

## 2017-09-15 DIAGNOSIS — R7303 Prediabetes: Secondary | ICD-10-CM

## 2017-09-15 DIAGNOSIS — E669 Obesity, unspecified: Secondary | ICD-10-CM | POA: Diagnosis not present

## 2017-09-15 DIAGNOSIS — Z6835 Body mass index (BMI) 35.0-35.9, adult: Secondary | ICD-10-CM

## 2017-09-15 DIAGNOSIS — J4521 Mild intermittent asthma with (acute) exacerbation: Secondary | ICD-10-CM | POA: Diagnosis not present

## 2017-09-15 DIAGNOSIS — I10 Essential (primary) hypertension: Secondary | ICD-10-CM | POA: Diagnosis not present

## 2017-09-15 NOTE — Patient Instructions (Addendum)
Please pay close attention to limiting carb's at each meals, as suggested at last appt (RECOMMENDATION # 2):       Diet Recommendations for Diabetes  Carbohydrate includes starch, sugar, and fiber.  Of these, only sugar and starch raise blood glucose.  (Fiber is found in fruits, vegetables [especially skin, seeds, and stalks] and whole grains.)   Starchy (carb) foods: Bread, rice, pasta, potatoes, corn, cereal, grits, crackers, bagels, muffins, all baked goods.  (Fruit, milk, and yogurt also have carbohydrate, but most of these foods will not spike your blood sugar as most starchy foods will.)  A few fruits do cause high blood sugars; use small portions of bananas (limit to 1/2 at a time), grapes, watermelon, oranges, and most tropical fruits.   Protein foods: Meat, fish, poultry, eggs, dairy foods, and beans such as pinto and kidney beans (beans also provide carbohydrate).   1. Eat at least REAL 3 meals and 1-2 snacks per day. Never go more than 4-5 hours while awake without eating. Eat breakfast within the first hour of getting up.   2. Limit starchy foods to TWO per meal and ONE per snack. ONE portion of a starchy  food is equal to the following:   - ONE slice of bread (or its equivalent, such as half of a hamburger bun).   - 1/2 cup of a "scoopable" starchy food such as potatoes or rice.   - 15 grams of Total Carbohydrate as shown on food label.  3. Include at every meal: a protein food, a carb food, and vegetables and/or fruit.   - Obtain twice the volume of veg's as protein or carbohydrate foods for both lunch and dinner.   - Fresh or frozen veg's are best.   - Keep frozen veg's on hand for a quick vegetable serving.      - You have done a good job on limiting carb's overall, including sweets.  Keep up the good work!  Please practice evaluating carb portion sizes at each meal.    - Emphasize: Aim for vegetables twice a day.   - Plan some meals using the Meal Planning form; bring to  follow-up appt for review.    - Use of cheese and olive oil:  Use moderately, but don't be afraid of using these at times.  Fat has a lot of calories, but it also provides some valuable flavor and enhances foods.  Food satisfaction is important to all of our dietary efforts.  It's important to recognize, however, that we tend to like best what we get accustomed to.  This means that the more you focus on good choices, the easier it is to make those healthy choices.    - Consider adding some stationary bicycling to your exercise routine IF it does not exacerbate knee pain.   - In addition, make a point of moving intermittently during the day, e.g., aim for moving at least 1 minute for every 20 that you sit.

## 2017-09-15 NOTE — Progress Notes (Signed)
Medical Nutrition Therapy:  Appt start time: 1000 end time:  1015. PCP:  [name], MD; [facility]  Assessment:  Primary concerns today: Weight management and Blood sugar control.   Learning Readiness: Change in progress      Cheryl Velez has been very sick with a URI since last appt, so eating and exercise have been off, although she was back at the gym yesterday, and has started eating normally again.  She has eaten no desserts, and has fewer carbohydrates.  She has also cut back on portion sizes some.  Cheryl Velez is still having problems with diarrhea, which she feels certain is related to Dexilant medication.  She intends to discuss with Dr. Dema Severin, but may just discontinue and start omeprazole prior to appt with Dr. Dema Severin in July.    Recent eating pattern includes 2-3 meals and 1-2 snacks per day..   Recent physical activity includes [employer] [facility] X wk: Walk 30 min (~1.1 mi), 5-10 min weights.    24-hr recall:  (Up at 7:30 AM) B (8 AM)-  5 oz Yoplait yogurt (120 kcal), water Snk (11 AM)-  2 scrmbld eggs, 1/2 c rice, hot tea, Sweet 'n Low, 1/2 tsp sugar L ( PM)-  --- Snk (2:30)-  1 banana, 1 granola bar (190 kcal, 23 g CHO), 10 oz 1% milk D (5 PM)-  K&W: 6 oz fish, 1/2 c cabbage, 1/2 c fried okra, 1/2 c slaw, water Snk ( PM)-  --- Typical day? Yes.  Except that lunch is usually dinner leftovers, and often includes veg's.    Progress Towards Goal(s):  In progress.   Nutritional Diagnosis:  [place]-3.3 Overweight/obesity As related to energy imbalance.  As evidenced by BMI >35.    Intervention:  Nutrition education  Handouts given during visit include:   After-Visit Summary (AVS)  Meal Planning form  Demonstrated degree of understanding via:  Teach Back  Barriers to learning/adherence to lifestyle change: Limited exercise capacity d/t knee pain.    Monitoring/Evaluation:  Dietary intake, exercise, and body weight in 4 week(s).

## 2017-09-23 ENCOUNTER — Other Ambulatory Visit: Payer: Self-pay | Admitting: Internal Medicine

## 2017-09-23 MED ORDER — METOPROLOL TARTRATE 25 MG PO TABS: 12.5000 mg | ORAL_TABLET | Freq: Two times a day (BID) | ORAL | 2 refills | Status: DC

## 2017-09-23 NOTE — Telephone Encounter (Signed)
Pt's medication was sent to pt's pharmacy as requested. Confirmation received.  °

## 2017-09-23 NOTE — Telephone Encounter (Signed)
New message     *STAT* If patient is at the pharmacy, call can be transferred to refill team.   1. Which medications need to be refilled? (please list name of each medication and dose if known) Metoprol Tar 25 mg tab  2. Which pharmacy/location (including street and city if local pharmacy) is medication to be sent to? [facility]  3. Do they need a 30 day or 90 day supply? 90 day

## 2017-10-01 DIAGNOSIS — I10 Essential (primary) hypertension: Secondary | ICD-10-CM | POA: Diagnosis not present

## 2017-10-01 DIAGNOSIS — E785 Hyperlipidemia, unspecified: Secondary | ICD-10-CM | POA: Diagnosis not present

## 2017-10-01 DIAGNOSIS — J4521 Mild intermittent asthma with (acute) exacerbation: Secondary | ICD-10-CM | POA: Diagnosis not present

## 2017-10-01 DIAGNOSIS — E039 Hypothyroidism, unspecified: Secondary | ICD-10-CM | POA: Diagnosis not present

## 2017-10-08 ENCOUNTER — Encounter: Payer: Self-pay | Admitting: Internal Medicine

## 2017-10-08 ENCOUNTER — Ambulatory Visit (INDEPENDENT_AMBULATORY_CARE_PROVIDER_SITE_OTHER): Payer: Medicare Other | Admitting: Internal Medicine

## 2017-10-08 VITALS — BP 112/64 | HR 60 | Ht 64.5 in | Wt 204.0 lb

## 2017-10-08 DIAGNOSIS — I422 Other hypertrophic cardiomyopathy: Secondary | ICD-10-CM | POA: Diagnosis not present

## 2017-10-08 DIAGNOSIS — R0789 Other chest pain: Secondary | ICD-10-CM

## 2017-10-08 DIAGNOSIS — R002 Palpitations: Secondary | ICD-10-CM

## 2017-10-08 NOTE — Patient Instructions (Addendum)
Medication Instructions:  Your physician recommends that you continue on your current medications as directed. Please refer to the Current Medication list given to you today.  -- If you need a refill on your cardiac medications before your next appointment, please call your pharmacy. --  Labwork: None ordered  Testing/Procedures: None ordered  Follow-Up: Your physician wants you to follow-up in: 6 months with Dr. End.    You will receive a reminder letter in the mail two months in advance. If you don't receive a letter, please call our office to schedule the follow-up appointment.  Thank you for choosing [facility]!!    Any Other Special Instructions Will Be Listed Below (If Applicable).         

## 2017-10-08 NOTE — Progress Notes (Signed)
Follow-up Outpatient Visit Date: 10/08/2017  Primary Care Provider: [name], MD [place] [id number]  Chief Complaint: Follow-up chest pain  HPI:  Cheryl Velez is a 77 y.o. year-old female with history of apical hypertrophic cardiomyopathy, HTN, HLD, OSA, hypothyroidism, osteoarthritis, and PTSD, who presents for follow-up of chest pain and palpitations.  I last saw her in March following cardiac catheterization that showed no significant CAD.  She continued to have intermittent palpitations, prompting [place] to obtain a 48-hour Holter monitor.  This showed sinus rhythm with rare PACs and PVCs.  No medication changes were made.  Today, Cheryl Velez reports resolution of chest pain and palpitations.  She notes fatigue/sleepiness but otherwise feels well.   She is trying to lose weight.  She denies lightheadedness, orthopnea, PND, and edema.  She is not sure if chest pain resolved due to metoprolol or PPI that she was previously taking.  However, she is no longer on the PPI due to diarrhea and notes that her chest pain has not returned.  --------------------------------------------------------------------------------------------------  Cardiovascular History & Procedures: Cardiovascular Problems:  Atypical chest pain  Apical hypertrophic cardiomyopathy  Risk Factors:  Hypertension, hyperlipidemia, obesity, and age greater than 38  Cath/PCI:  LHC (06/23/17): Minimal luminal irregularities involving the LMCA, LAD, and RCA. Hyperdynamic LVEF with suggestion of apical hypertrophy. LVEDP 24-26 mmHg.  CV Surgery:  None  EP Procedures and Devices:  48-hour Holter monitor (07/30/2017): Predominantly sinus rhythm with rare PACs and PVCs.  No significant arrhythmias.  Average heart rate 64 bpm.  Non-Invasive Evaluation(s):  TTE (07/06/17): Normal LV size with moderate LVH. LVEF 55-60% with grade 2 diastolic dysfunction. Trivial aortic regurgitation. Mild left  atrial enlargement. Normal RV size and function. Upper normal PA pressure.  Exercise MPI (01/08/13): No ischemia or scar. LVEF 61%.   Recent CV Pertinent Labs: Lab Results  Component Value Date   CHOL 171 01/08/2013   HDL 38 (L) 01/08/2013   LDLCALC 90 01/08/2013   TRIG 215 (H) 01/08/2013   CHOLHDL 4.5 01/08/2013   INR 1.0 06/19/2017   K 4.6 06/19/2017   BUN 17 06/19/2017   CREATININE 0.81 06/19/2017    Past medical and surgical history were reviewed and updated in EPIC.  Current Meds  Medication Sig  . aspirin EC 81 MG tablet Take 81 mg by mouth at bedtime.   Marland Kitchen b complex vitamins tablet Take 1 tablet by mouth at bedtime. SUPER B COMPLEX  . Calcium Carb-Cholecalciferol (CALCIUM 600+D3 PO) Take 1 tablet by mouth at bedtime.  . Cholecalciferol (VITAMIN D3) 2000 units TABS Take 2,000 Units by mouth at bedtime.  . cholestyramine light (PREVALITE) 4 g packet Take 4 g by mouth 2 (two) times daily.  . fenofibrate 160 MG tablet Take 160 mg by mouth at bedtime.   . hyoscyamine ([name]) 0.125 MG tablet Take 0.125 mg by mouth every 4 (four) hours as needed for cramping.  Marland Kitchen levothyroxine (SYNTHROID, LEVOTHROID) 75 MCG tablet Take 75 mcg by mouth daily at 2 am.   . losartan (COZAAR) 100 MG tablet Take 100 mg by mouth at bedtime.   . metoprolol tartrate (LOPRESSOR) 25 MG tablet Take 0.5 tablets (12.5 mg total) by mouth 2 (two) times daily.  . Multiple Vitamin (MULTIVITAMIN WITH MINERALS) TABS tablet Take 1 tablet by mouth at bedtime.   . naproxen sodium (ANAPROX) 550 MG tablet Take 550 mg by mouth 2 (two) times daily as needed (FOR PAIN.).    Allergies: Lipitor [  atorvastatin]; Paxil [paroxetine hcl]; Shellfish allergy; Latex; Nickel; and Other  Social History   Tobacco Use  . Smoking status: Never Smoker  . Smokeless tobacco: Never Used  Substance Use Topics  . Alcohol use: No  . Drug use: No    Family History  Problem Relation Age of Onset  . Stroke Mother   . Hypertension  Mother   . Colon polyps Mother   . Suicidality Sister   . Hypertension Brother 44  . Heart attack Brother   . Hypertension Father   . CAD Father   . Cirrhosis Father   . CAD Maternal Grandmother   . CAD Maternal Grandfather   . Breast cancer Paternal Grandmother   . CAD Paternal Grandmother   . CAD Paternal Grandfather   . Heart Problems Brother 102  . Diabetes Brother 41  . Hypertension Brother   . Heart Problems Brother   . Hypertension Sister   . Obesity Sister   . Diverticulitis Sister   . Hypertension Sister   . Gout Sister   . Alcohol abuse Sister   . Polycythemia Sister   . Other Brother        MURDERED  . Hypertension Brother   . CVA Brother   . Blindness Brother     Review of Systems: A 12-system review of systems was performed and was negative except as noted in the HPI.  --------------------------------------------------------------------------------------------------  Physical Exam: BP 112/64   Pulse 60   Ht 5' 4.5" (1.638 m)   Wt 204 lb (92.5 kg)   SpO2 94%   BMI 34.48 kg/m   General:  NAD. HEENT: No conjunctival pallor or scleral icterus. Moist mucous membranes.  OP clear. Neck: Supple without lymphadenopathy, thyromegaly, JVD, or HJR. Lungs: Normal work of breathing. Clear to auscultation bilaterally without wheezes or crackles. Heart: Regular rate and rhythm without murmurs, rubs, or gallops. Abd: Bowel sounds present. Soft, NT/ND without hepatosplenomegaly Ext: No lower extremity edema. Radial, PT, and DP pulses are 2+ bilaterally. Skin: Warm and dry without rash.  Lab Results  Component Value Date   WBC 7.9 06/19/2017   HGB 12.5 06/19/2017   HCT 37.3 06/19/2017   MCV 89 06/19/2017   PLT 331 06/19/2017    Lab Results  Component Value Date   NA 142 06/19/2017   K 4.6 06/19/2017   CL 106 06/19/2017   CO2 23 06/19/2017   BUN 17 06/19/2017   CREATININE 0.81 06/19/2017   GLUCOSE 76 06/19/2017   ALT 21 05/03/2016    Lab Results    Component Value Date   CHOL 171 01/08/2013   HDL 38 (L) 01/08/2013   LDLCALC 90 01/08/2013   TRIG 215 (H) 01/08/2013   CHOLHDL 4.5 01/08/2013    --------------------------------------------------------------------------------------------------  ASSESSMENT AND PLAN: Apical hypertrophic cardiomyopathy No symptoms other than fatigue that is non-specific.  She appears euvolemic on exam.  We will continue with heart rate control with metoprolol.  If fatigue remains a significant problem, we may need to consider trying an alternate beta-blocker or non-dihydropyridine calcium channel blocker.  Atypical chest pain and palpitations Resolved with beta blockade.  Continue current dose of metoprolol.  Return to clinic in 6 months.  [name], MD 10/09/2017 4:43 PM

## 2017-10-09 ENCOUNTER — Encounter: Payer: Self-pay | Admitting: Internal Medicine

## 2017-10-14 DIAGNOSIS — J0100 Acute maxillary sinusitis, unspecified: Secondary | ICD-10-CM | POA: Diagnosis not present

## 2017-10-20 ENCOUNTER — Ambulatory Visit: Payer: Medicare Other | Admitting: Family Medicine

## 2017-11-02 DIAGNOSIS — R05 Cough: Secondary | ICD-10-CM | POA: Diagnosis not present

## 2017-11-02 DIAGNOSIS — I10 Essential (primary) hypertension: Secondary | ICD-10-CM | POA: Diagnosis not present

## 2017-11-02 DIAGNOSIS — E6609 Other obesity due to excess calories: Secondary | ICD-10-CM | POA: Diagnosis not present

## 2017-11-09 DIAGNOSIS — E785 Hyperlipidemia, unspecified: Secondary | ICD-10-CM | POA: Diagnosis not present

## 2017-11-09 DIAGNOSIS — I10 Essential (primary) hypertension: Secondary | ICD-10-CM | POA: Diagnosis not present

## 2017-11-09 DIAGNOSIS — E039 Hypothyroidism, unspecified: Secondary | ICD-10-CM | POA: Diagnosis not present

## 2017-11-09 DIAGNOSIS — J4521 Mild intermittent asthma with (acute) exacerbation: Secondary | ICD-10-CM | POA: Diagnosis not present

## 2017-11-17 ENCOUNTER — Ambulatory Visit: Payer: Medicare Other | Admitting: Family Medicine

## 2017-12-01 ENCOUNTER — Encounter: Payer: Self-pay | Admitting: Family Medicine

## 2017-12-01 ENCOUNTER — Ambulatory Visit (INDEPENDENT_AMBULATORY_CARE_PROVIDER_SITE_OTHER): Payer: Medicare Other | Admitting: Family Medicine

## 2017-12-01 DIAGNOSIS — I10 Essential (primary) hypertension: Secondary | ICD-10-CM | POA: Diagnosis not present

## 2017-12-01 DIAGNOSIS — E785 Hyperlipidemia, unspecified: Secondary | ICD-10-CM

## 2017-12-01 DIAGNOSIS — E669 Obesity, unspecified: Secondary | ICD-10-CM | POA: Diagnosis not present

## 2017-12-01 DIAGNOSIS — Z6833 Body mass index (BMI) 33.0-33.9, adult: Secondary | ICD-10-CM

## 2017-12-01 NOTE — Patient Instructions (Addendum)
-   Continue your excellent efforts at limiting carb's, aiming for no more than 2 carb portions per meal.    - Keep up the vegetable intake.  As the harvest season ends, make sure you keep some veg's on hand, and make enough leftovers for lunches.    - Great job on your walking also.  Keep up the good work.    - Making meals more enjoyable without being excessive in carb or calories:  - Use leftover grilled meat mixed with a lot of veg's (stir-fried) served over a small amount of a starchy food.  You can eat a lot of veg's this way, getting very little carb and calories.    - A lot of vegetables will be helpful for your weight, blood sugar, AND blood pressure.  (All fruits and veg's are good to excellent sources of potassium, which is especially good for your blood pressure.)  - You have lost more than 13 lb since March.  This has been a steady loss, not fast, but very appropriate.    - Call any time if you would like another appointment:  [phone].

## 2017-12-01 NOTE — Progress Notes (Signed)
Medical Nutrition Therapy:  Appt start time: 1330 end time:  1430. PCP:  [name], MD; [facility]  Assessment:  Primary concerns today: Weight management and Blood sugar control.   Learning Readiness: Change in progress      Cheryl Velez has a BMI of 33.93 today, and she also has hypertension and hyperlipidemia.  She regularly checks BP.  Yesterday's was 115/50-something.  She has stopped using pork seasoning in vegetables, she eats more regularly now, 3 real meals a day, and she is trying to limit starches.  She has been checking her weight about 2 X wk, and was concerned that  it has plateaued about 10 days ago, although I encouraged her to be patient with this, and just continue making the food choices and exercising as she has been doing.   Cheryl Velez clearly knew what represents a carb portion, and how many she is targeting per meal.  [facility] now managing to get a weekly total of about 4 miles, up from ~3 miles/week previously.  She has tried using a stationary bike, but found it uncomfortable on her knee.    Recent eating pattern includes 3 meals and 1 snack per day.   Recent physical activity includes [employer] 3 X wk: Walks 30 min (>4 mi/week), 5-10 min weights.    24-hr recall:  (Up at 6:30 AM) B (7 AM)-  1 c hot tea, 1 tsp sugar, 1 pkt Sweet 'n Low, 1 tbsp 1% milk B (8 AM)-  2 boiled eggs, 1/2 Fr toast  Snk ( AM)-  --- L (12 PM)-  1 c rice & beef (mostly rice), sliced tomato, water Snk (3 PM)-  1/4 c almonds, water D (6:30 PM)-  5 oz steak, 1/2 c pot salad, 1/2 corn on cob, >1 c broccoli, side salad, 1 tbsp drsng Snk ( PM)-  --- Typical day? Yes.    Progress Towards Goal(s):  In progress.   Nutritional Diagnosis:  Progress noted on [place]-3.3 Overweight/obesity As related to energy imbalance.  As evidenced by weight loss of >6 lb since May appt.    Intervention:  Nutrition education  Handouts given during visit include:  After-Visit Summary  (AVS)  Demonstrated degree of understanding via:  Teach Back  Barriers to learning/adherence to lifestyle change: Limited exercise capacity d/t knee pain.    Monitoring/Evaluation:  Dietary intake, exercise, and body weight prn.

## 2017-12-04 IMAGING — US US ABDOMEN LIMITED
1 series · 14 of 25 positions shown · non-contrast
Comparison: None.

CLINICAL DATA: Epigastric pain for 10 hours

EXAM:
US ABDOMEN LIMITED - RIGHT UPPER QUADRANT

[Series 1: us abdomen limited · 0.28mm/px · 14 of 53 slices shown]
[im 1/53]
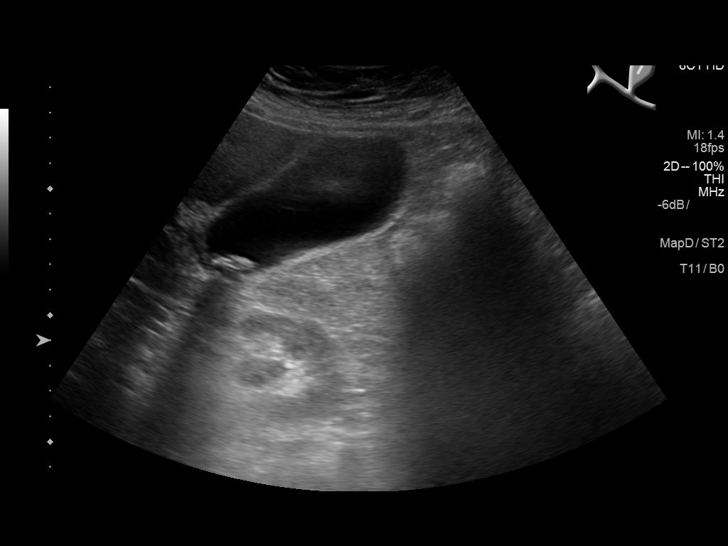
[im 5/53]
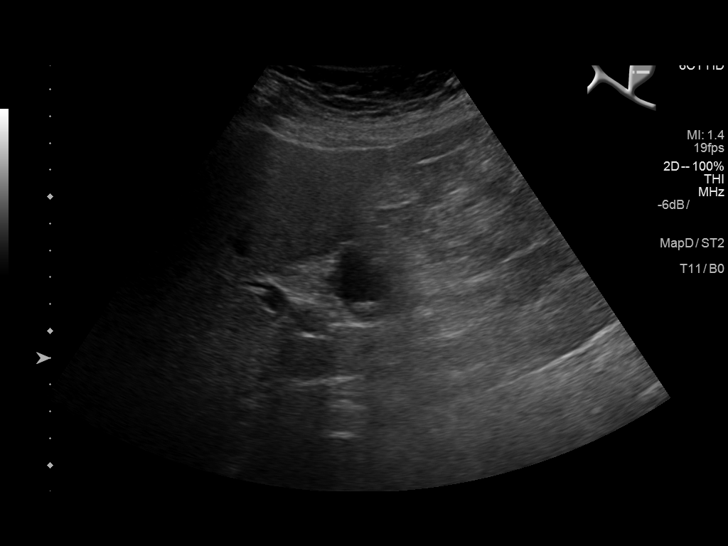
[im 9/53]
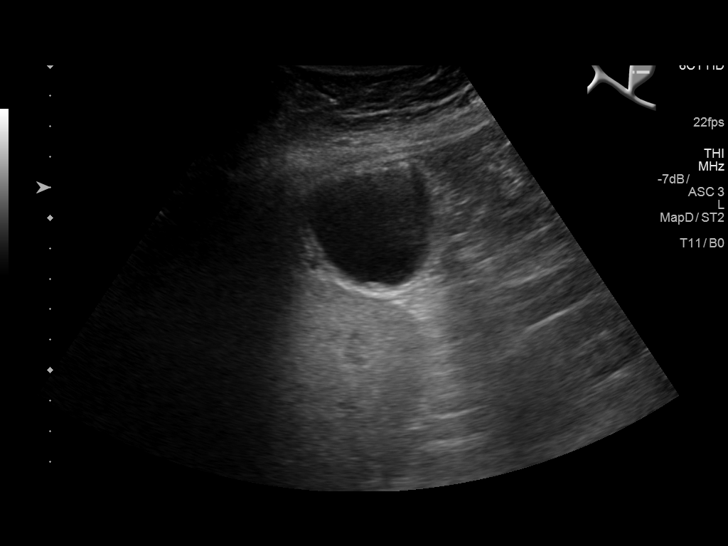
[im 14/53]
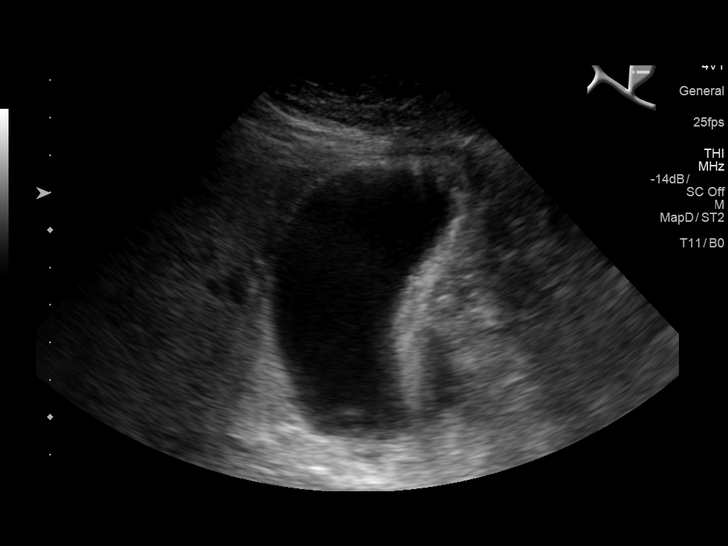
[im 18/53]
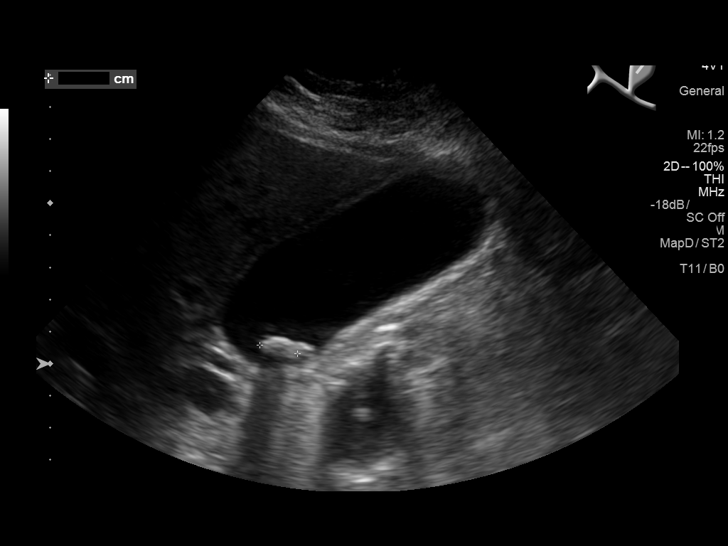
[im 20/53]
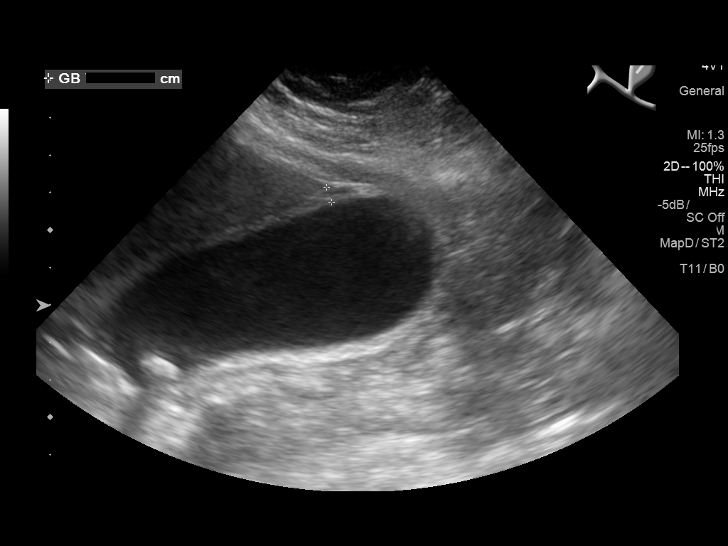
[im 24/53]
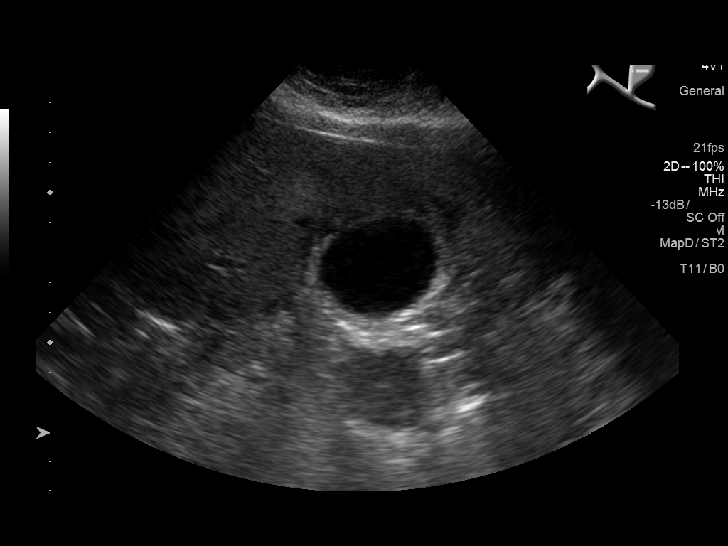
[im 29/53]
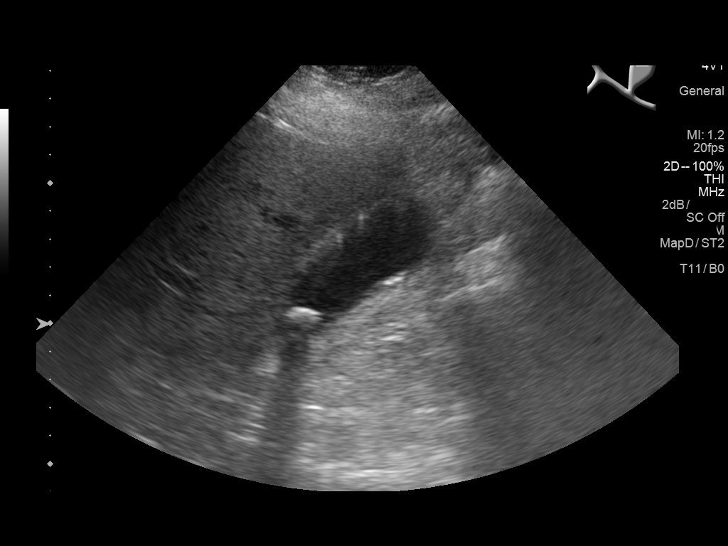
[im 33/53]
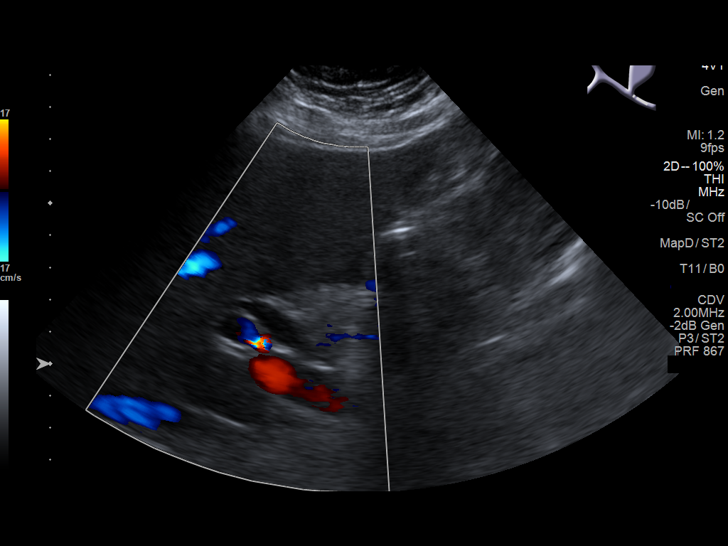
[im 35/53]
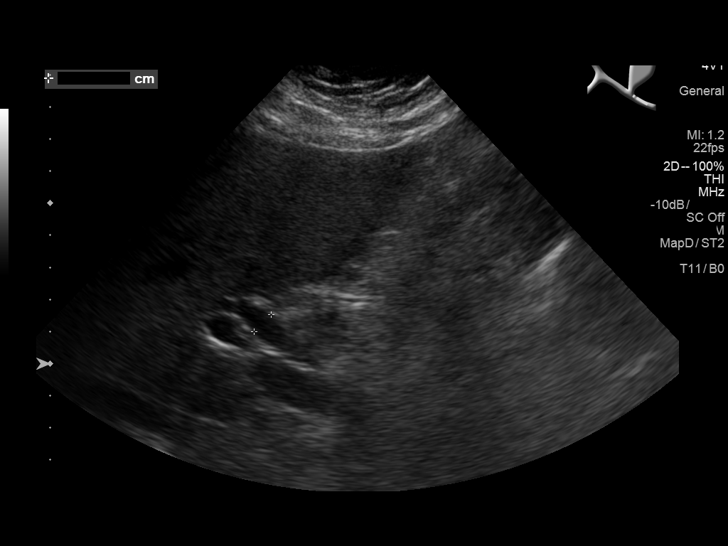
[im 40/53]
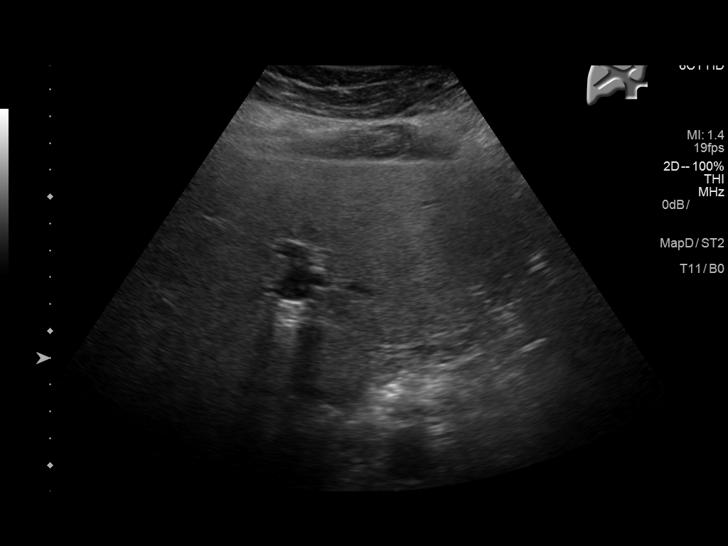
[im 44/53]
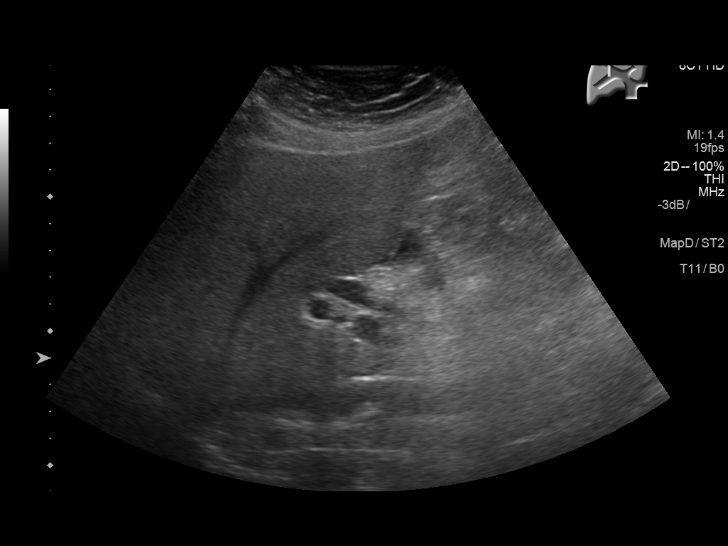
[im 48/53]
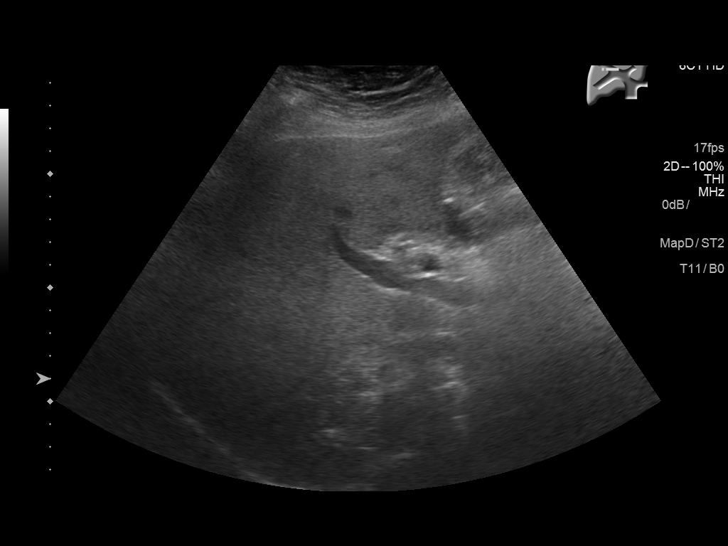
[im 53/53]
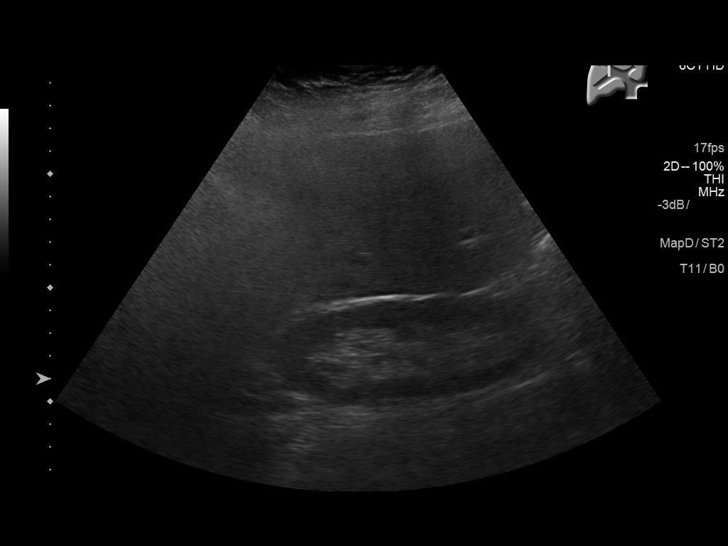

[14 of 25 positions shown; findings below may reference images not displayed]

FINDINGS: Gallbladder:

Intraluminal gallbladder calculi measuring up to 1.2 cm. Mild
gallbladder mural thickening, 4.2 mm. There is pericholecystic
fluid, and the patient was tender to probe pressure over the
gallbladder. There are are echogenic foci in the gallbladder wall
with comet tail artifact, suggesting adenomyomatosis.

Common bile duct:

Diameter: Upper normal for age, 7.5 mm.

Liver:

Generalized echogenicity of hepatic parenchyma without focal lesion.
This may represent fatty infiltration.
IMPRESSION: 1. Cholelithiasis with gallbladder mural thickening, pericholecystic
fluid and positive sonographic Murphy sign. This is concerning for
acute cholecystitis
2. Changes of the gallbladder wall suggesting adenomyomatosis.
3. Upper normal caliber of the extrahepatic bile ducts.
4. Hepatic steatosis

## 2017-12-16 DIAGNOSIS — J4521 Mild intermittent asthma with (acute) exacerbation: Secondary | ICD-10-CM | POA: Diagnosis not present

## 2017-12-16 DIAGNOSIS — E039 Hypothyroidism, unspecified: Secondary | ICD-10-CM | POA: Diagnosis not present

## 2017-12-16 DIAGNOSIS — E785 Hyperlipidemia, unspecified: Secondary | ICD-10-CM | POA: Diagnosis not present

## 2017-12-16 DIAGNOSIS — I10 Essential (primary) hypertension: Secondary | ICD-10-CM | POA: Diagnosis not present

## 2017-12-21 DIAGNOSIS — Z961 Presence of intraocular lens: Secondary | ICD-10-CM | POA: Diagnosis not present

## 2018-02-02 DIAGNOSIS — E785 Hyperlipidemia, unspecified: Secondary | ICD-10-CM | POA: Diagnosis not present

## 2018-02-02 DIAGNOSIS — E6609 Other obesity due to excess calories: Secondary | ICD-10-CM | POA: Diagnosis not present

## 2018-02-02 DIAGNOSIS — I10 Essential (primary) hypertension: Secondary | ICD-10-CM | POA: Diagnosis not present

## 2018-02-02 DIAGNOSIS — E039 Hypothyroidism, unspecified: Secondary | ICD-10-CM | POA: Diagnosis not present

## 2018-02-02 DIAGNOSIS — R7303 Prediabetes: Secondary | ICD-10-CM | POA: Diagnosis not present

## 2018-02-02 DIAGNOSIS — Z23 Encounter for immunization: Secondary | ICD-10-CM | POA: Diagnosis not present

## 2018-02-16 DIAGNOSIS — J4521 Mild intermittent asthma with (acute) exacerbation: Secondary | ICD-10-CM | POA: Diagnosis not present

## 2018-02-16 DIAGNOSIS — E039 Hypothyroidism, unspecified: Secondary | ICD-10-CM | POA: Diagnosis not present

## 2018-02-16 DIAGNOSIS — I10 Essential (primary) hypertension: Secondary | ICD-10-CM | POA: Diagnosis not present

## 2018-02-16 DIAGNOSIS — E785 Hyperlipidemia, unspecified: Secondary | ICD-10-CM | POA: Diagnosis not present

## 2018-03-08 DIAGNOSIS — R1013 Epigastric pain: Secondary | ICD-10-CM | POA: Diagnosis not present

## 2018-03-15 ENCOUNTER — Ambulatory Visit: Payer: Medicare Other | Admitting: Internal Medicine

## 2018-03-15 NOTE — Progress Notes (Deleted)
Follow-up Outpatient Visit Date: 03/15/2018  Primary Care Provider: [name], MD [place] [id number]  Chief Complaint: ***  HPI:  Cheryl Velez is a 77 y.o. year-old female with history of apical hypertrophic cardiomyopathy, HTN, HLD, OSA, hypothyroidism, osteoarthritis, and PTSD, who presents for follow-up of chest pain and palpitations.  I last saw her in June, at which time she complained only of fatigue/sleepiness.  Previously noted palpitations and chest pain had resolved without further intervention.  --------------------------------------------------------------------------------------------------  Cardiovascular History & Procedures: Cardiovascular Problems:  Atypical chest pain  Apical hypertrophic cardiomyopathy  Risk Factors:  Hypertension, hyperlipidemia, obesity, and age greater than 34  Cath/PCI:  LHC (06/23/17): Minimal luminal irregularities involving the LMCA, LAD, and RCA. Hyperdynamic LVEF with suggestion of apical hypertrophy. LVEDP 24-26 mmHg.  CV Surgery:  None  EP Procedures and Devices:  48-hour Holter monitor (07/30/2017): Predominantly sinus rhythm with rare PACs and PVCs.  No significant arrhythmias.  Average heart rate 64 bpm.  Non-Invasive Evaluation(s):  TTE (07/06/17): Normal LV size with moderate LVH. LVEF 55-60% with grade 2 diastolic dysfunction. Trivial aortic regurgitation. Mild left atrial enlargement. Normal RV size and function. Upper normal PA pressure.  Exercise MPI (01/08/13): No ischemia or scar. LVEF 61%.  Recent CV Pertinent Labs: Lab Results  Component Value Date   CHOL 171 01/08/2013   HDL 38 (L) 01/08/2013   LDLCALC 90 01/08/2013   TRIG 215 (H) 01/08/2013   CHOLHDL 4.5 01/08/2013   INR 1.0 06/19/2017   K 4.6 06/19/2017   BUN 17 06/19/2017   CREATININE 0.81 06/19/2017    Past medical and surgical history were reviewed and updated in EPIC.  No outpatient medications have been  marked as taking for the 03/15/18 encounter (Appointment) with Jaret Coppedge, [name], MD.    Allergies: Lipitor [atorvastatin]; Paxil [paroxetine hcl]; Shellfish allergy; Latex; Nickel; and Other  Social History   Tobacco Use  . Smoking status: Never Smoker  . Smokeless tobacco: Never Used  Substance Use Topics  . Alcohol use: No  . Drug use: No    Family History  Problem Relation Age of Onset  . Stroke Mother   . Hypertension Mother   . Colon polyps Mother   . Suicidality Sister   . Hypertension Brother 46  . Heart attack Brother   . Hypertension Father   . CAD Father   . Cirrhosis Father   . CAD Maternal Grandmother   . CAD Maternal Grandfather   . Breast cancer Paternal Grandmother   . CAD Paternal Grandmother   . CAD Paternal Grandfather   . Heart Problems Brother 25  . Diabetes Brother 27  . Hypertension Brother   . Heart Problems Brother   . Hypertension Sister   . Obesity Sister   . Diverticulitis Sister   . Hypertension Sister   . Gout Sister   . Alcohol abuse Sister   . Polycythemia Sister   . Other Brother        MURDERED  . Hypertension Brother   . CVA Brother   . Blindness Brother     Review of Systems: A 12-system review of systems was performed and was negative except as noted in the HPI.  --------------------------------------------------------------------------------------------------  Physical Exam: There were no vitals taken for this visit.  General:  *** HEENT: No conjunctival pallor or scleral icterus. Moist mucous membranes.  OP clear. Neck: Supple without lymphadenopathy, thyromegaly, JVD, or HJR. No carotid bruit. Lungs: Normal work of breathing. Clear to  auscultation bilaterally without wheezes or crackles. Heart: Regular rate and rhythm without murmurs, rubs, or gallops. Non-displaced PMI. Abd: Bowel sounds present. Soft, NT/ND without hepatosplenomegaly Ext: No lower extremity edema. Radial, PT, and DP pulses are 2+  bilaterally. Skin: Warm and dry without rash.  EKG:  ***  Lab Results  Component Value Date   WBC 7.9 06/19/2017   HGB 12.5 06/19/2017   HCT 37.3 06/19/2017   MCV 89 06/19/2017   PLT 331 06/19/2017    Lab Results  Component Value Date   NA 142 06/19/2017   K 4.6 06/19/2017   CL 106 06/19/2017   CO2 23 06/19/2017   BUN 17 06/19/2017   CREATININE 0.81 06/19/2017   GLUCOSE 76 06/19/2017   ALT 21 05/03/2016    Lab Results  Component Value Date   CHOL 171 01/08/2013   HDL 38 (L) 01/08/2013   LDLCALC 90 01/08/2013   TRIG 215 (H) 01/08/2013   CHOLHDL 4.5 01/08/2013    --------------------------------------------------------------------------------------------------  ASSESSMENT AND PLAN: [name] Dougles Kimmey, MD 03/15/2018 7:29 AM

## 2018-04-05 ENCOUNTER — Ambulatory Visit (INDEPENDENT_AMBULATORY_CARE_PROVIDER_SITE_OTHER): Payer: Medicare Other | Admitting: Internal Medicine

## 2018-04-05 ENCOUNTER — Encounter: Payer: Self-pay | Admitting: Internal Medicine

## 2018-04-05 VITALS — BP 142/62 | HR 67 | Ht 64.5 in | Wt 205.0 lb

## 2018-04-05 DIAGNOSIS — I10 Essential (primary) hypertension: Secondary | ICD-10-CM | POA: Diagnosis not present

## 2018-04-05 DIAGNOSIS — I422 Other hypertrophic cardiomyopathy: Secondary | ICD-10-CM | POA: Diagnosis not present

## 2018-04-05 DIAGNOSIS — E781 Pure hyperglyceridemia: Secondary | ICD-10-CM

## 2018-04-05 NOTE — Progress Notes (Signed)
Follow-up Outpatient Visit Date: 04/05/2018  Primary Care Provider: [name], MD [place] [address]  Chief Complaint: Follow-up hypertrophic cardiomyopathy  HPI:  Cheryl Velez is a 77 y.o. year-old female with history of apical hypertrophic cardiomyopathy, HTN, HLD, OSA, hypothyroidism, osteoarthritis, and PTSD, who presents for follow-up of palpitations and apical HCM.  I last saw her in June, at which time she was feeling well other than fatigue and sleepiness.  Chest pain and palpitations had resolved.  No medication changes were made.  Today, Cheryl Velez reports that she has been feeling very well with good energy.  She still naps during the afternoons sometimes.  She is compliant with CPAP at night.  She denies chest pain, shortness of breath, palpitations, and edema.  She notes rare orthostatic lightheadedness.  She exercises at the gym without difficulty.  Home blood pressures are typically in the 120s over 50s.  --------------------------------------------------------------------------------------------------  Cardiovascular History & Procedures: Cardiovascular Problems:  Atypical chest pain  Apical hypertrophic cardiomyopathy  Risk Factors:  Hypertension, hyperlipidemia, obesity, and age greater than 88  Cath/PCI:  LHC (06/23/17): Minimal luminal irregularities involving the LMCA, LAD, and RCA. Hyperdynamic LVEF with suggestion of apical hypertrophy. LVEDP 24-26 mmHg.  CV Surgery:  None  EP Procedures and Devices:  48-hour Holter monitor (07/30/2017): Predominantly sinus rhythm with rare PACs and PVCs.  No significant arrhythmias.  Average heart rate 64 bpm.  Non-Invasive Evaluation(s):  TTE (07/06/17): Normal LV size with moderate LVH. LVEF 55-60% with grade 2 diastolic dysfunction. Trivial aortic regurgitation. Mild left atrial enlargement. Normal RV size and function. Upper normal PA pressure.  Exercise MPI (01/08/13): No  ischemia or scar. LVEF 61%.  Recent CV Pertinent Labs: Lab Results  Component Value Date   CHOL 171 01/08/2013   HDL 38 (L) 01/08/2013   LDLCALC 90 01/08/2013   TRIG 215 (H) 01/08/2013   CHOLHDL 4.5 01/08/2013   INR 1.0 06/19/2017   K 4.6 06/19/2017   BUN 17 06/19/2017   CREATININE 0.81 06/19/2017    Past medical and surgical history were reviewed and updated in EPIC.  Current Meds  Medication Sig  . aspirin EC 81 MG tablet Take 81 mg by mouth at bedtime.   Marland Kitchen b complex vitamins tablet Take 1 tablet by mouth at bedtime. SUPER B COMPLEX  . Calcium Carb-Cholecalciferol (CALCIUM 600+D3 PO) Take 1 tablet by mouth at bedtime.  . Cholecalciferol (VITAMIN D3) 2000 units TABS Take 2,000 Units by mouth at bedtime.  . cholestyramine light (PREVALITE) 4 g packet Take 4 g by mouth 2 (two) times daily.  . fenofibrate 160 MG tablet Take 160 mg by mouth at bedtime.   . hyoscyamine ([name]) 0.125 MG tablet Take 0.125 mg by mouth every 4 (four) hours as needed for cramping.  Marland Kitchen levothyroxine (SYNTHROID, LEVOTHROID) 75 MCG tablet Take 75 mcg by mouth daily at 2 am.   . losartan (COZAAR) 100 MG tablet Take 100 mg by mouth at bedtime.   . metoprolol tartrate (LOPRESSOR) 25 MG tablet Take 0.5 tablets (12.5 mg total) by mouth 2 (two) times daily.  . Multiple Vitamin (MULTIVITAMIN WITH MINERALS) TABS tablet Take 1 tablet by mouth at bedtime.   . naproxen sodium (ANAPROX) 550 MG tablet Take 550 mg by mouth 2 (two) times daily as needed (FOR PAIN.).    Allergies: Lipitor [atorvastatin]; Paxil [paroxetine hcl]; Shellfish allergy; Latex; Nickel; and Other  Social History   Tobacco Use  . Smoking status: Never Smoker  .  Smokeless tobacco: Never Used  Substance Use Topics  . Alcohol use: No  . Drug use: No    Family History  Problem Relation Age of Onset  . Stroke Mother   . Hypertension Mother   . Colon polyps Mother   . Suicidality Sister   . Hypertension Brother 27  . Heart attack Brother     . Hypertension Father   . CAD Father   . Cirrhosis Father   . CAD Maternal Grandmother   . CAD Maternal Grandfather   . Breast cancer Paternal Grandmother   . CAD Paternal Grandmother   . CAD Paternal Grandfather   . Heart Problems Brother 4  . Diabetes Brother 47  . Hypertension Brother   . Heart Problems Brother   . Hypertension Sister   . Obesity Sister   . Diverticulitis Sister   . Hypertension Sister   . Gout Sister   . Alcohol abuse Sister   . Polycythemia Sister   . Other Brother        MURDERED  . Hypertension Brother   . CVA Brother   . Blindness Brother     Review of Systems: A 12-system review of systems was performed and was negative except as noted in the HPI.  --------------------------------------------------------------------------------------------------  Physical Exam: BP (!) 142/62 (BP Location: Left Arm, Patient Position: Sitting, Cuff Size: Normal)   Pulse 67   Ht 5' 4.5" (1.638 m)   Wt 205 lb (93 kg)   BMI 34.64 kg/m   Repeat BP 134/60  General: NAD. HEENT: No conjunctival pallor or scleral icterus. Moist mucous membranes.  OP clear. Neck: Supple without lymphadenopathy, thyromegaly, JVD, or HJR.  Lungs: Normal work of breathing. Clear to auscultation bilaterally without wheezes or crackles. Heart: Regular rate and rhythm without murmurs, rubs, or gallops. Non-displaced PMI. Abd: Bowel sounds present. Soft, NT/ND without hepatosplenomegaly Ext: No lower extremity edema. Radial, PT, and DP pulses are 2+ bilaterally. Skin: Warm and dry without rash.  EKG: Normal sinus rhythm with anterolateral ST/T changes and nonspecific inferior T wave abnormality consistent with apical HCM.  No significant change from prior tracing on 07/20/2017.  Lab Results  Component Value Date   WBC 7.9 06/19/2017   HGB 12.5 06/19/2017   HCT 37.3 06/19/2017   MCV 89 06/19/2017   PLT 331 06/19/2017    Lab Results  Component Value Date   NA 142 06/19/2017   K  4.6 06/19/2017   CL 106 06/19/2017   CO2 23 06/19/2017   BUN 17 06/19/2017   CREATININE 0.81 06/19/2017   GLUCOSE 76 06/19/2017   ALT 21 05/03/2016    Lab Results  Component Value Date   CHOL 171 01/08/2013   HDL 38 (L) 01/08/2013   LDLCALC 90 01/08/2013   TRIG 215 (H) 01/08/2013   CHOLHDL 4.5 01/08/2013    --------------------------------------------------------------------------------------------------  ASSESSMENT AND PLAN: Apical hypertrophic cardiomyopathy No symptoms at this time.  Heart rate is reasonably well controlled with low-dose metoprolol (target resting heart rate near 60 bpm).  Prior Holter monitor showed no evidence of NSVT or sustained arrhythmia.  We will continue current dose of metoprolol tartrate.  I have encouraged Ms. Jobin to stay well-hydrated.  Today, she notes that her brother died from a presumed "heart attack," though it sounds more like sudden cardiac death while fishing in [place].  Her mother also suffered from heart failure.  In light of this family history and findings suggestive of apical hypertrophic cardiomyopathy, I will refer Ms. Longenecker  to our genetics specialist for further evaluation.  I also encouraged Ms. Betker to let her children know of her diagnosis for appropriate medical screening.  Hypertriglyceridemia Continued management per Dr. Dema Severin.  Hypertension Initial blood pressure mildly elevated; repeat blood pressure upper normal.  Continue current doses of metoprolol and losartan.  Follow-up: Return to clinic in 6 months.  [name], MD 04/05/2018 8:25 AM

## 2018-04-05 NOTE — Patient Instructions (Signed)
Medication Instructions:  Your physician recommends that you continue on your current medications as directed. Please refer to the Current Medication list given to you today.  If you need a refill on your cardiac medications before your next appointment, please call your pharmacy.   Lab work: none If you have labs (blood work) drawn today and your tests are completely normal, you will receive your results only by: Marland Kitchen MyChart Message (if you have MyChart) OR . A paper copy in the mail If you have any lab test that is abnormal or we need to change your treatment, we will call you to review the results.  Testing/Procedures: You have been referred to Dr Broadus John at our [facility] for [employer] testing.  Please call our office if you do not have anyone call to schedule you an appointment.   Follow-Up: At [facility], you and your health needs are our priority.  As part of our continuing mission to provide you with exceptional heart care, we have created designated Provider Care Teams.  These Care Teams include your primary Cardiologist (physician) and Advanced Practice Providers (APPs -  Physician Assistants and Nurse Practitioners) who all work together to provide you with the care you need, when you need it. You will need a follow up appointment in 6 months.  Please call our office 2 months in advance to schedule this appointment.  You may see [name], MD or one of the following Advanced Practice Providers on your designated Care Team:   Murray Hodgkins, NP Christell Faith, PA-C . Marrianne Mood, PA-C

## 2018-04-20 IMAGING — CR DG ABDOMEN ACUTE W/ 1V CHEST
1 series · 4 of 4 positions shown · non-contrast
Comparison: 12/30/2014

CLINICAL DATA: Mid left upper abdominal pain and interscapular back
pain. Nausea and vomiting. Onset tonight around [DATE].

EXAM:
DG ABDOMEN ACUTE W/ 1V CHEST

[Series 1: dg abd acute w/chest · 0.14mm/px · 4 of 4 slices shown]
[im 1/4]
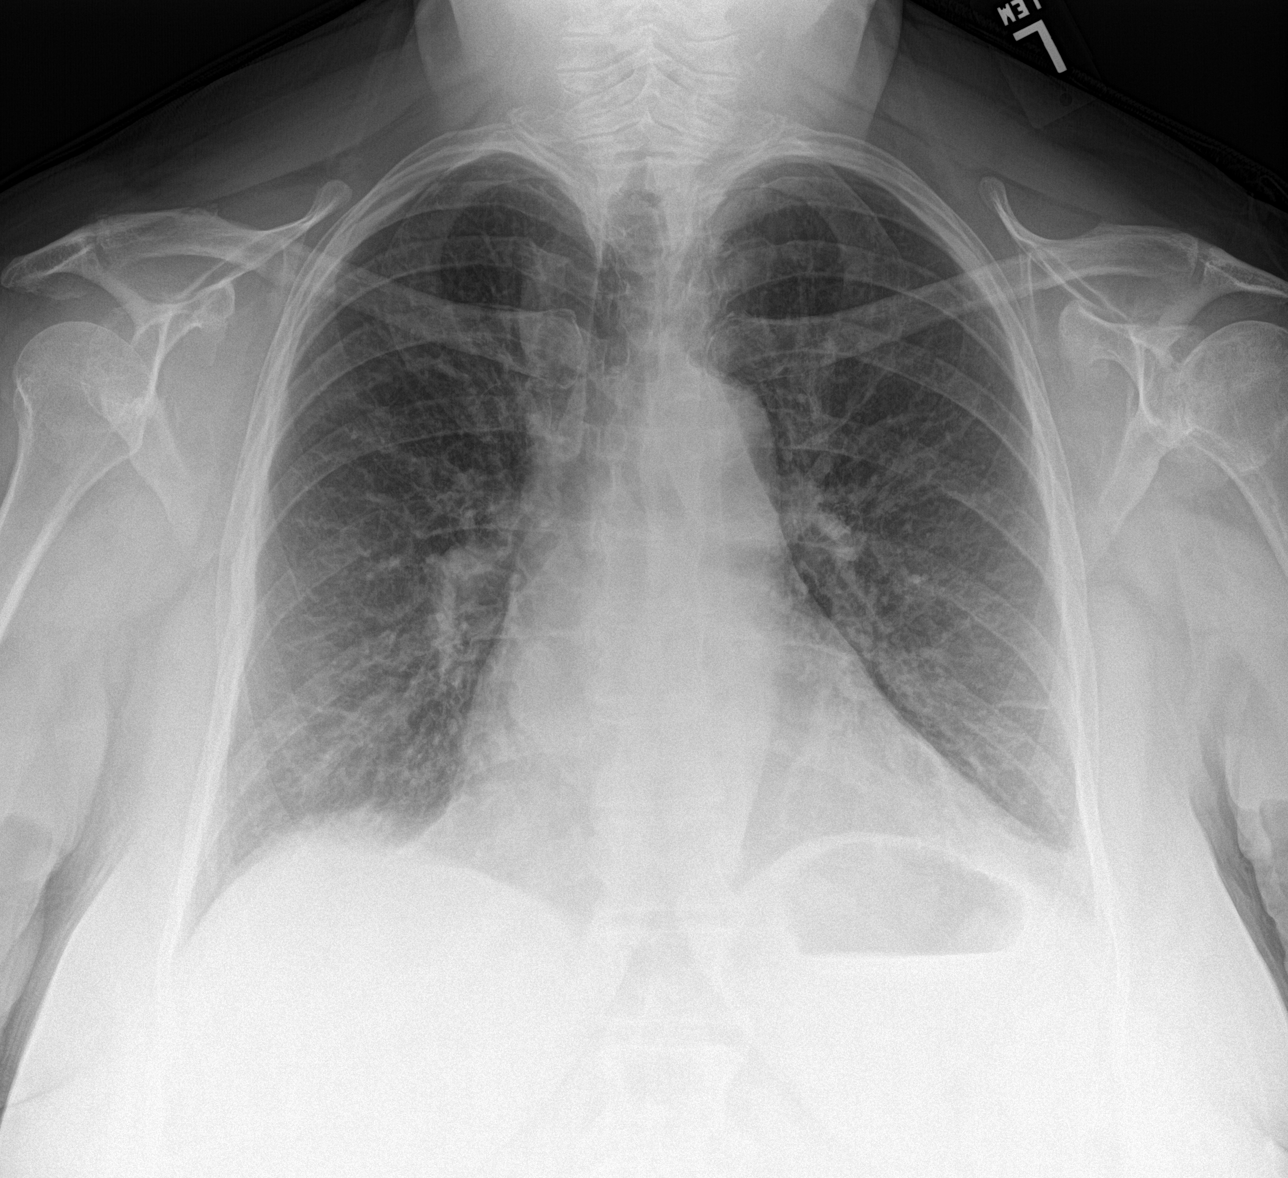
[im 2/4]
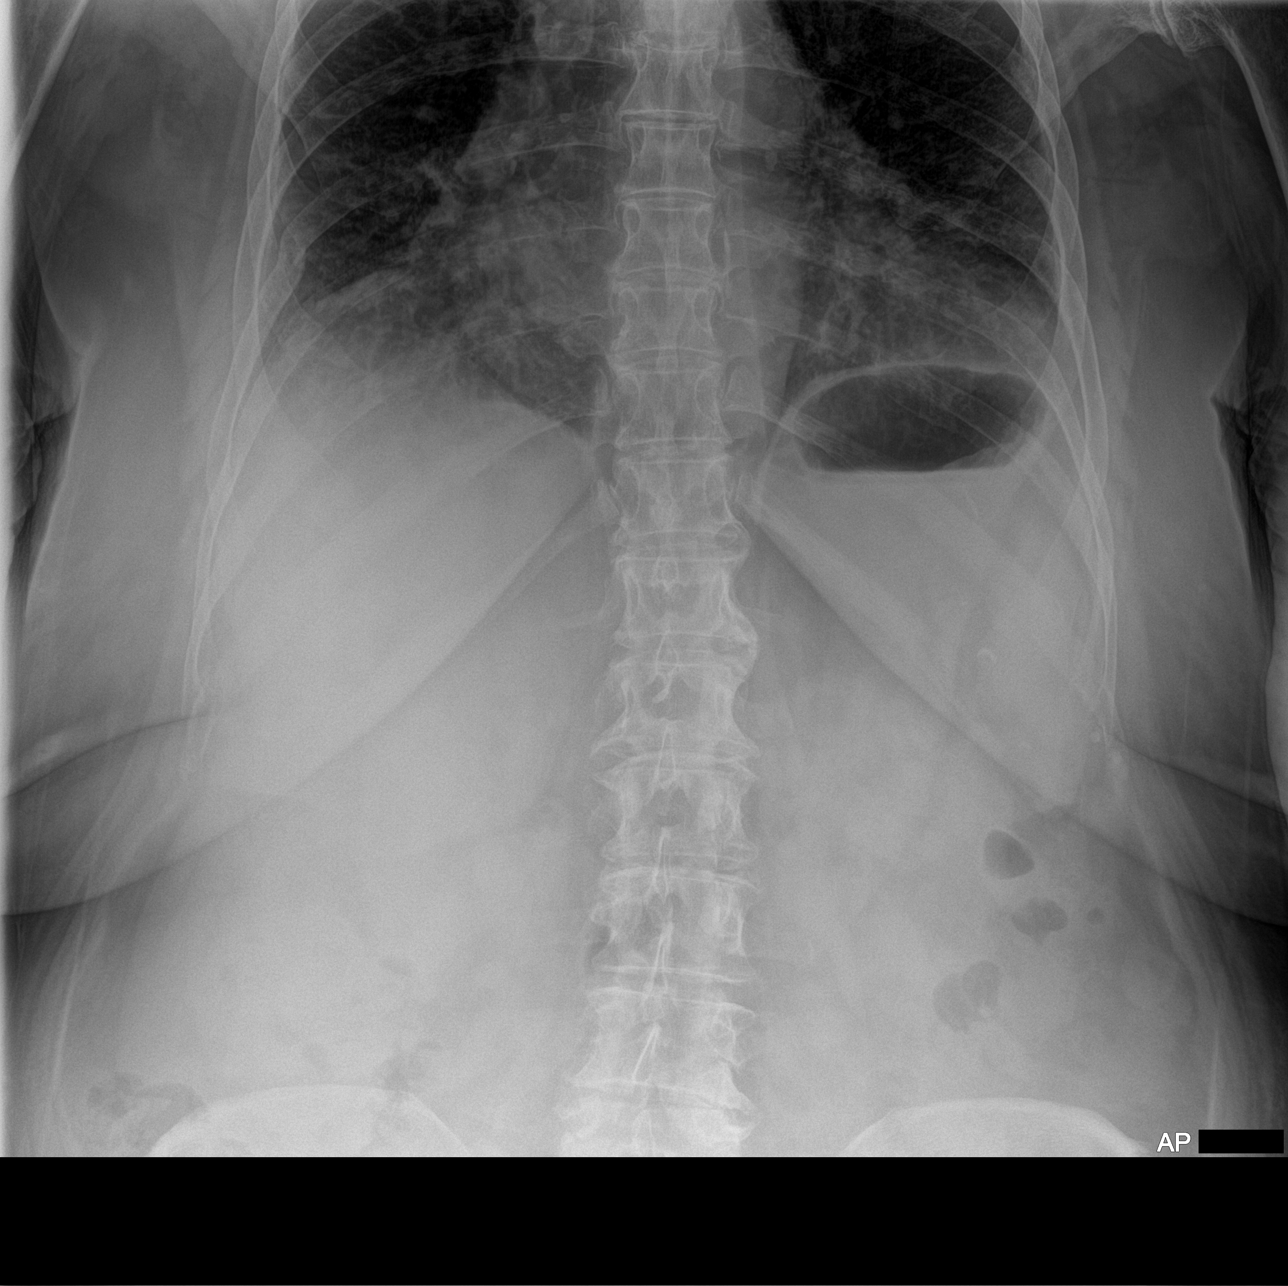
[im 3/4]
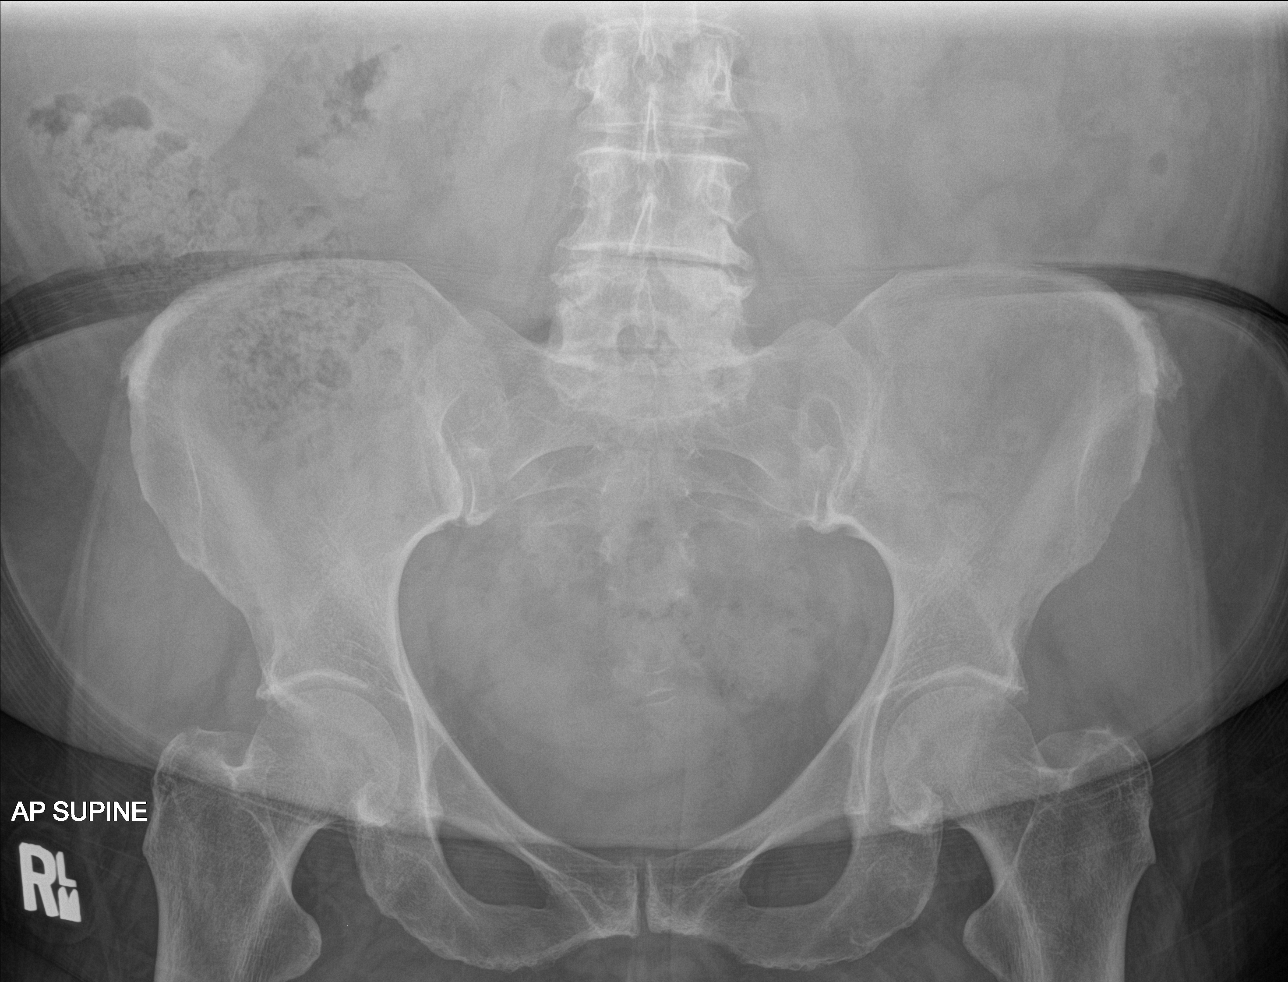
[im 4/4]
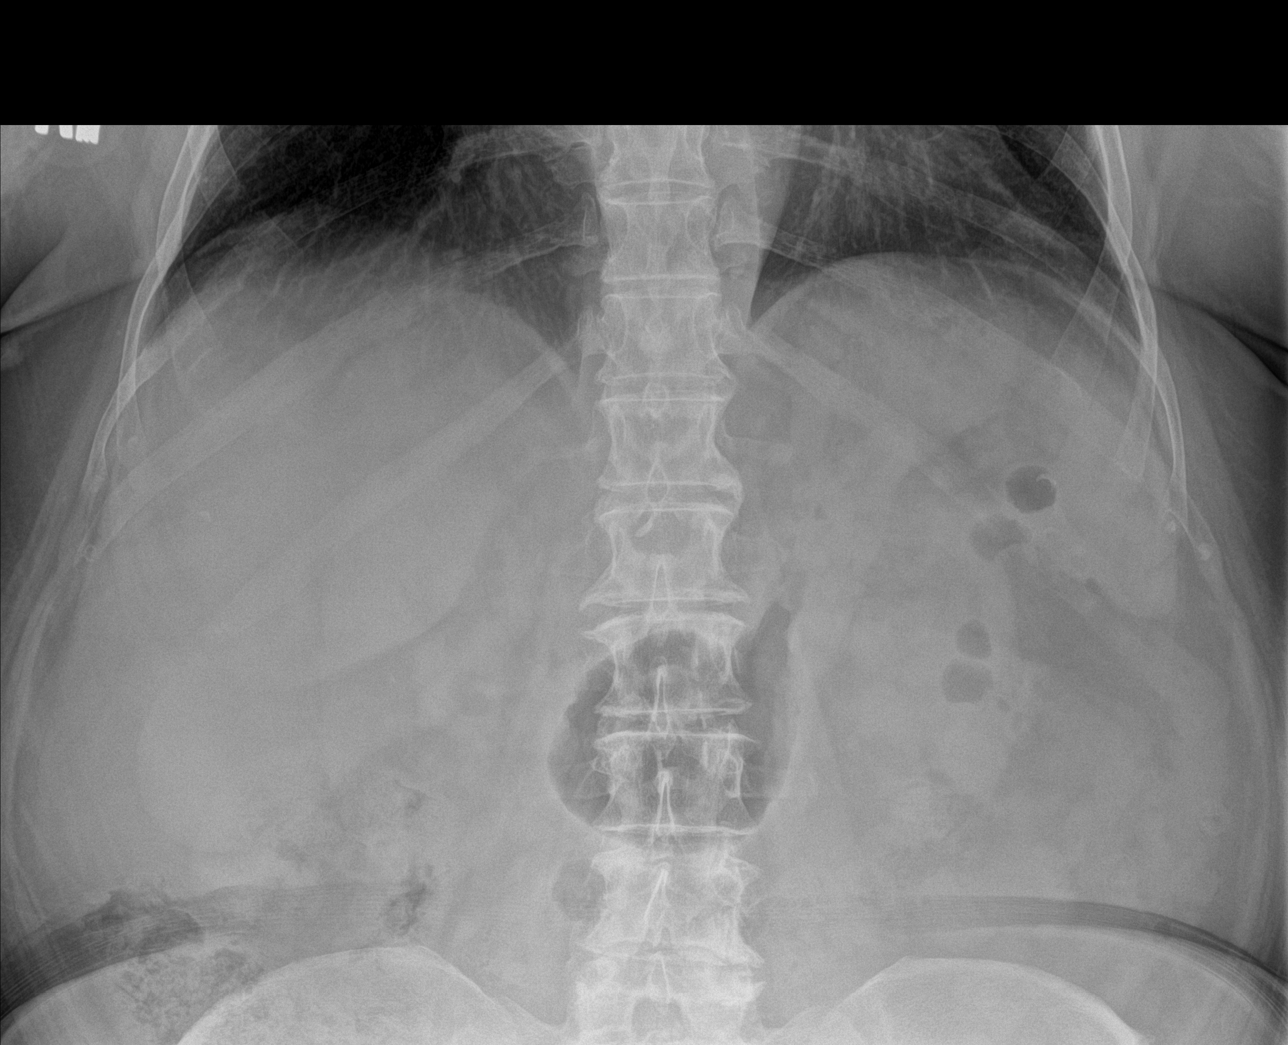

[4 of 4 positions shown; findings below may reference images not displayed]

FINDINGS: There is no evidence of dilated bowel loops or free intraperitoneal
air. No radiopaque calculi or other significant radiographic
abnormality is seen. Mild unchanged cardiomegaly. The lungs are
clear except for stable linear scarring in the left base. No large
effusions.
IMPRESSION: Negative abdominal radiographs.  No acute cardiopulmonary disease.

## 2018-05-06 DIAGNOSIS — I10 Essential (primary) hypertension: Secondary | ICD-10-CM | POA: Diagnosis not present

## 2018-05-06 DIAGNOSIS — Z1239 Encounter for other screening for malignant neoplasm of breast: Secondary | ICD-10-CM | POA: Diagnosis not present

## 2018-05-06 DIAGNOSIS — E6609 Other obesity due to excess calories: Secondary | ICD-10-CM | POA: Diagnosis not present

## 2018-05-06 DIAGNOSIS — Z6834 Body mass index (BMI) 34.0-34.9, adult: Secondary | ICD-10-CM | POA: Diagnosis not present

## 2018-05-28 DIAGNOSIS — I10 Essential (primary) hypertension: Secondary | ICD-10-CM | POA: Diagnosis not present

## 2018-05-28 DIAGNOSIS — J4521 Mild intermittent asthma with (acute) exacerbation: Secondary | ICD-10-CM | POA: Diagnosis not present

## 2018-05-28 DIAGNOSIS — E039 Hypothyroidism, unspecified: Secondary | ICD-10-CM | POA: Diagnosis not present

## 2018-05-28 DIAGNOSIS — E785 Hyperlipidemia, unspecified: Secondary | ICD-10-CM | POA: Diagnosis not present

## 2018-06-10 ENCOUNTER — Ambulatory Visit: Payer: Medicare Other | Admitting: Genetic Counselor

## 2018-06-14 DIAGNOSIS — M7052 Other bursitis of knee, left knee: Secondary | ICD-10-CM | POA: Diagnosis not present

## 2018-06-14 DIAGNOSIS — M1711 Unilateral primary osteoarthritis, right knee: Secondary | ICD-10-CM | POA: Diagnosis not present

## 2018-06-17 DIAGNOSIS — I10 Essential (primary) hypertension: Secondary | ICD-10-CM | POA: Diagnosis not present

## 2018-06-17 DIAGNOSIS — J4521 Mild intermittent asthma with (acute) exacerbation: Secondary | ICD-10-CM | POA: Diagnosis not present

## 2018-06-17 DIAGNOSIS — E039 Hypothyroidism, unspecified: Secondary | ICD-10-CM | POA: Diagnosis not present

## 2018-06-17 DIAGNOSIS — E785 Hyperlipidemia, unspecified: Secondary | ICD-10-CM | POA: Diagnosis not present

## 2018-07-13 NOTE — Progress Notes (Signed)
Pre Test GC  Referral Reason  Cheryl Velez was referred for genetic consult of apical HCM by Dr. Caryl Comes subsequent to her recent diagnosis  Genetic Consultation Notes  Cheryl Velez was counseled on the genetics of hypertrophic cardiomyopathy (HCM), namely its autosomal dominant inheritance. We also talked about incomplete penetrance, variable expression and digenic/compound mutations that can be seen in some patients with HCM. We briefly discussed the inheritance pattern and treatment plans for the infiltrative cardiomyopathies that present as HCM phenocopies.   We walked through the process of genetic testing. I explained to her that there are three possible outcomes of genetic testing; namely positive, negative and variant of unknown significance. A positive outcome can be expected in about 70%-75% of HCM cases that do not have risk factors for HCM, present early in life with increased severity and has a first degree relative with sudden cardiac death and/or a diagnosis of HCM. A negative test does not exclude a genetic basis for HCM. Limitations in current genetic testing methodology can produce a negative result. Variants of unknown significance (VUS) are typically observed in about 10%-15% of cases. However, this number can increase if genetic testing is performed by a laboratory that employs extremely large gene panels of 9 or more sarcomeric genes. I explained to her that typically a VUS is so classified if the variant is not well understood as very few individuals have been reported to harbor this variant or its role in gene function has not been elucidated. The potential outcomes of genetic testing and subsequent management of at-risk family members were discussed so as to manage expectations. Her medical and 5-generation family history was obtained. See details below-  Cheryl Velez (III.10 on pedigree) is a very pleasant 78 year-old Caucasian lady. She informs me that she was referred to a  cardiologist last year to manage her excessive chest pains. 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 She tells me that she had a heart murmur and palpitations as a child. She has had episodic chest pains for the last 50 years and feels fatigued all the time. She has felt dizzy in the past but has attributed that to a drop in her blood sugar. She tells me that she fainted once while in college and suspects that it was anxiety-related.  Traditional Risk Factors Cheryl Velez was diagnosed with hypertension about 10 years ago and states that it is well controlled by medication. She does not have the other typical risk factors of HCM, namely aortic valve stenosis and is not involved in competitive sporting activities.  Family history Cheryl Velez has 3 children; two daughters (IV.5, IV.7) ages 40 and 23 and a son (IV.6) age 51. Her children and grandchildren (V.1-V.6) are in good health with no overt heart disease. She has 9 older siblings (III.1-III.9) and a younger sister (III.11).  There is no history of heart disease amongst her siblings. However, her eldest brother (III.1) died suddenly at age 67. She informs me that he was at [place] on a fishing trip and was dining at [employer] when he collapsed and died suddenly. An autopsy was not done to confirm his cause of death but she tells me that he did have some prior heart disease as he had complained of chest pressure in the past.  In addition, she mentions a niece (not shown on pedigree) that died of a heart attack at age 2.  Cheryl Velez's father (II.2) was an alcoholic and died fairly young from liver cirrhosis. His  brother (II.1) died at age 91 and she is not aware of the cause of his death nor, that of her paternal grandmother (I.2) who died in her 28s. Cheryl Velez's paternal grandfather died of natural causes at 47.  Cheryl Velez's mother (II.3) had chest pains at age 42 and was found to have congestive heart failure at 58. She passed away  at age 63. There is no history of heart disease in her maternal lineage.  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 In the absence of an overwhelming family history of HCM or sudden death, it is highly likely that she has a benign variant of apical HCM. Thus, genetic testing is not warranted. In addition, her insurance will not cover genetic testing for HCM. I have suggested that her children seek cardiology surveillance for HCM, and if they are found to have HCM, we can pursue genetic testing in that individual,  Please note that the patient has not been counseled in this visit on personal, cultural or ethical issues that she may face due to her heart condition.     Lattie Corns, Ph.D, [facility] Clinical Molecular Geneticist

## 2018-07-27 DIAGNOSIS — J4521 Mild intermittent asthma with (acute) exacerbation: Secondary | ICD-10-CM | POA: Diagnosis not present

## 2018-07-27 DIAGNOSIS — E039 Hypothyroidism, unspecified: Secondary | ICD-10-CM | POA: Diagnosis not present

## 2018-07-27 DIAGNOSIS — E785 Hyperlipidemia, unspecified: Secondary | ICD-10-CM | POA: Diagnosis not present

## 2018-07-27 DIAGNOSIS — I10 Essential (primary) hypertension: Secondary | ICD-10-CM | POA: Diagnosis not present

## 2018-08-20 DIAGNOSIS — M8580 Other specified disorders of bone density and structure, unspecified site: Secondary | ICD-10-CM | POA: Diagnosis not present

## 2018-08-20 DIAGNOSIS — J4521 Mild intermittent asthma with (acute) exacerbation: Secondary | ICD-10-CM | POA: Diagnosis not present

## 2018-08-20 DIAGNOSIS — E785 Hyperlipidemia, unspecified: Secondary | ICD-10-CM | POA: Diagnosis not present

## 2018-08-20 DIAGNOSIS — I10 Essential (primary) hypertension: Secondary | ICD-10-CM | POA: Diagnosis not present

## 2018-08-20 DIAGNOSIS — E039 Hypothyroidism, unspecified: Secondary | ICD-10-CM | POA: Diagnosis not present

## 2018-09-08 ENCOUNTER — Telehealth: Payer: Self-pay | Admitting: Internal Medicine

## 2018-09-08 NOTE — Telephone Encounter (Signed)

## 2018-09-13 ENCOUNTER — Telehealth (INDEPENDENT_AMBULATORY_CARE_PROVIDER_SITE_OTHER): Payer: Medicare Other | Admitting: Internal Medicine

## 2018-09-13 ENCOUNTER — Encounter: Payer: Self-pay | Admitting: Internal Medicine

## 2018-09-13 ENCOUNTER — Other Ambulatory Visit: Payer: Self-pay

## 2018-09-13 VITALS — BP 126/75 | HR 72 | Ht 65.5 in | Wt 208.0 lb

## 2018-09-13 DIAGNOSIS — I10 Essential (primary) hypertension: Secondary | ICD-10-CM

## 2018-09-13 DIAGNOSIS — I422 Other hypertrophic cardiomyopathy: Secondary | ICD-10-CM

## 2018-09-13 DIAGNOSIS — R0789 Other chest pain: Secondary | ICD-10-CM

## 2018-09-13 NOTE — Patient Instructions (Signed)
If you have recurrence of chest pain, Dr End recommends you take FAMOTIDINE 20 MG BY MOUTH DAILY for at least one month.    Medication Instructions:  Your physician recommends that you continue on your current medications as directed. Please refer to the Current Medication list given to you today.  If you need a refill on your cardiac medications before your next appointment, please call your pharmacy.   Lab work: none If you have labs (blood work) drawn today and your tests are completely normal, you will receive your results only by: Marland Kitchen MyChart Message (if you have MyChart) OR . A paper copy in the mail If you have any lab test that is abnormal or we need to change your treatment, we will call you to review the results.  Testing/Procedures: none  Follow-Up: At [facility], you and your health needs are our priority.  As part of our continuing mission to provide you with exceptional heart care, we have created designated Provider Care Teams.  These Care Teams include your primary Cardiologist (physician) and Advanced Practice Providers (APPs -  Physician Assistants and Nurse Practitioners) who all work together to provide you with the care you need, when you need it. You will need a follow up appointment in 6 months.  Please call our office 2 months in advance to schedule this appointment.  You may see [name], MD or one of the following Advanced Practice Providers on your designated Care Team:   Murray Hodgkins, NP Christell Faith, PA-C . Marrianne Mood, PA-C

## 2018-09-13 NOTE — Progress Notes (Signed)
Virtual Visit via Telephone Note   This visit type was conducted due to national recommendations for restrictions regarding the COVID-19 Pandemic (e.g. social distancing) in an effort to limit this patient's exposure and mitigate transmission in our community.  Due to her co-morbid illnesses, this patient is at least at moderate risk for complications without adequate follow up.  This format is felt to be most appropriate for this patient at this time.  The patient did not have access to video technology/had technical difficulties with video requiring transitioning to audio format only (telephone).  All issues noted in this document were discussed and addressed.  No physical exam could be performed with this format.  Please refer to the patient's chart for her  consent to telehealth for [facility].   Date:  09/13/2018   ID:  Cheryl Velez, DOB 1940/07/10, MRN [id number]  Patient Location: Home Provider Location: Home  PCP:  [name], MD  Cardiologist:  [name], MD  Electrophysiologist:  None   Evaluation Performed:  Follow-Up Visit  Chief Complaint:  Follow-up HCM  History of Present Illness:    Cheryl Velez is a 78 y.o. female with history of apical hypertrophic cardiomyopathy, HTN, HLD, OSA, hypothyroidism, osteoarthritis, and PTSD.  We are speaking today for follow-up of her HCM.  I last saw her in 03/2018, at which time she was doing well.  She was using CPAP regularly and reported good energy.  Blood pressure was also well-controlled.  We referred for for genetic counseling; it was felt that she has a benign variant of apical hypertrophic cardiomyopathy.  Genetic testing was not recommended.  Today, [name] reports feeling well.  She has noticed rare episodes of sharp, substernal chest pain, which happen about once a month.  There are no obvious precipitants or associated symptoms. The pain usually lasts 2-3 minutes and then resolves spontaneously.  Ms. Hamric notes rare  palpitations without associated symptoms that feel like flutters and last less than a minute.  The chest pain and palpitations are not related.  [name] denies shortness of breath, orthopnea, PND, edema, and lightheadedness.  Her blood pressure is generally well-controlled, similar to today's reading.  The patient does not have symptoms concerning for COVID-19 infection (fever, chills, cough, or new shortness of breath).    Past Medical History:  Diagnosis Date   Adenomatous colon polyp    Arthritis    Chest pain 01/06/2013   Chest pain    GI (gastrointestinal bleed)    [name], ADENOMATOUS POLYP 6/14, 3 YEAR FOLLOW UP, 9/17, 3 YEAR FOLLOW UP.   Hearing loss    BIATERAL HEARING AIDS, [name]   Hypercholesterolemia    Hyperlipidemia    Hypertension    DX 8/16   Hypothyroidism    OSA (obstructive sleep apnea)    (PSG 03/21/12 ESS 6, AHI 14/hr REM 44/hr, RDI SAME, O2 min 77% APAP STARTED 12/15), [name]   Osteoarthritis    KNEES, HANDS HAS SEEN [name]   PTSD (post-traumatic stress disorder)    Thyroid disease    Past Surgical History:  Procedure Laterality Date   CARDIAC CATHETERIZATION     KNEE SURGERY     LEFT HEART CATH AND CORONARY ANGIOGRAPHY N/A 06/23/2017   Procedure: LEFT HEART CATH AND CORONARY ANGIOGRAPHY;  Surgeon: [name], MD;  Location: [facility];  Service: Cardiovascular;  Laterality: N/A;     Current Meds  Medication Sig   aspirin EC 81 MG  tablet Take 81 mg by mouth at bedtime.    b complex vitamins tablet Take 1 tablet by mouth at bedtime. SUPER B COMPLEX   Calcium Carb-Cholecalciferol (CALCIUM 600+D3 PO) Take 1 tablet by mouth at bedtime.   Cholecalciferol (VITAMIN D3) 2000 units TABS Take 2,000 Units by mouth at bedtime.   cholestyramine light (PREVALITE) 4 g packet Take 4 g by mouth 2 (two) times daily.   fenofibrate 160 MG tablet Take 160 mg by mouth at bedtime.    hyoscyamine ([name]) 0.125 MG tablet  Take 0.125 mg by mouth every 4 (four) hours as needed for cramping.   levothyroxine (SYNTHROID, LEVOTHROID) 75 MCG tablet Take 75 mcg by mouth daily at 2 am.    losartan (COZAAR) 100 MG tablet Take 100 mg by mouth at bedtime.    metoprolol tartrate (LOPRESSOR) 25 MG tablet Take 0.5 tablets (12.5 mg total) by mouth 2 (two) times daily.   Multiple Vitamin (MULTIVITAMIN WITH MINERALS) TABS tablet Take 1 tablet by mouth at bedtime.    naproxen sodium (ANAPROX) 550 MG tablet Take 550 mg by mouth 2 (two) times daily as needed (FOR PAIN.).     Allergies:   Lipitor [atorvastatin]; Paxil [paroxetine hcl]; Shellfish allergy; Latex; Nickel; and Other   Social History   Tobacco Use   Smoking status: Never Smoker   Smokeless tobacco: Never Used  Substance Use Topics   Alcohol use: No   Drug use: No     Family Hx: The patient's family history includes Alcohol abuse in her sister; Blindness in her brother; Breast cancer in her paternal grandmother; CAD in her father, maternal grandfather, maternal grandmother, paternal grandfather, and paternal grandmother; CVA in her brother; Cirrhosis in her father; Colon polyps in her mother; Diabetes (age of onset: 12) in her brother; Diverticulitis in her sister; Gout in her sister; Heart Problems in her brother; Heart Problems (age of onset: 75) in her brother; Heart attack in her brother; Hypertension in her brother, brother, father, mother, sister, and sister; Hypertension (age of onset: 76) in her brother; Obesity in her sister; Other in her brother; Polycythemia in her sister; Stroke in her mother; Suicidality in her sister.  ROS:   Please see the history of present illness.   All other systems reviewed and are negative.   Prior CV studies:   The following studies were reviewed today:  Cath/PCI:  LHC (06/23/17): Minimal luminal irregularities involving the LMCA, LAD, and RCA. Hyperdynamic LVEF with suggestion of apical hypertrophy. LVEDP 24-26  mmHg.  EP Procedures and Devices:  48-hour Holter monitor (07/30/2017): Predominantly sinus rhythm with rare PACs and PVCs. No significant arrhythmias. Average heart rate 64 bpm.  Non-Invasive Evaluation(s):  TTE (07/06/17): Normal LV size with moderate LVH. LVEF 55-60% with grade 2 diastolic dysfunction. Trivial aortic regurgitation. Mild left atrial enlargement. Normal RV size and function. Upper normal PA pressure.  Exercise MPI (01/08/13): No ischemia or scar. LVEF 61%.  Labs/Other Tests and Data Reviewed:    EKG:  No ECG reviewed.  Recent Labs: No results found for requested labs within last 8760 hours.   Recent Lipid Panel Lab Results  Component Value Date/Time   CHOL 171 01/08/2013 12:17 AM   TRIG 215 (H) 01/08/2013 12:17 AM   HDL 38 (L) 01/08/2013 12:17 AM   CHOLHDL 4.5 01/08/2013 12:17 AM   LDLCALC 90 01/08/2013 12:17 AM    Wt Readings from Last 3 Encounters:  09/13/18 208 lb (94.3 kg)  04/05/18 205 lb (93 kg)  12/01/17 200 lb 12.8 oz (91.1 kg)     Objective:    Vital Signs:  BP 126/75 (BP Location: Left Arm, Patient Position: Sitting, Cuff Size: Normal)    Pulse 72    Ht 5' 5.5" (1.664 m)    Wt 208 lb (94.3 kg)    BMI 34.09 kg/m    VITAL SIGNS:  reviewed  ASSESSMENT & PLAN:    Apical hypertrophic cardiomyopathy: No signs or symptoms of heart failure noted.  Atypical chest pain is unlikely to be related to HCM.  Palpitations bring up the possibility of ventricular arrhythmia, though given reassuring Holter monitor a year ago (showed isolated PAC's and PVC's) and lack of associated symptoms, we will defer additional testing at this time.  We will continue metoprolol tartrate 12.5 mg BID.  Hypertension: Blood pressure well-controlled.  Continue current doses of metoprolol and losartan.  Atypical chest pain: Not consistent with angina.  Catheterization in 05/2017 showed widely patent coronary arteries without significant disease.  Ms. Palomino uses prn famotidine  for rare heartburn.  I have recommended that she try taking famotidine 20 mg daily for at least a month if she has recurrent chest pain, in case she is having silent GERD with intermittent esophageal spasm.  COVID-19 Education: The signs and symptoms of COVID-19 were discussed with the patient and how to seek care for testing (follow up with PCP or arrange E-visit).  The importance of social distancing was discussed today.  Time:   Today, I have spent 12 minutes with the patient with telehealth technology discussing the above problems.    Medication Adjustments/Labs and Tests Ordered: Current medicines are reviewed at length with the patient today.  Concerns regarding medicines are outlined above.   Tests Ordered: None.  Medication Changes: Try famotidine 20 mg daily for at least 1 month if recurrent atypical chest pain.  Disposition:  Follow up in 6 month(s)  Signed, [name], MD  09/13/2018 10:30 AM    [place]

## 2018-10-01 DIAGNOSIS — E785 Hyperlipidemia, unspecified: Secondary | ICD-10-CM | POA: Diagnosis not present

## 2018-10-01 DIAGNOSIS — Z0000 Encounter for general adult medical examination without abnormal findings: Secondary | ICD-10-CM | POA: Diagnosis not present

## 2018-10-01 DIAGNOSIS — M8588 Other specified disorders of bone density and structure, other site: Secondary | ICD-10-CM | POA: Diagnosis not present

## 2018-10-01 DIAGNOSIS — E039 Hypothyroidism, unspecified: Secondary | ICD-10-CM | POA: Diagnosis not present

## 2018-10-01 DIAGNOSIS — I10 Essential (primary) hypertension: Secondary | ICD-10-CM | POA: Diagnosis not present

## 2018-10-01 DIAGNOSIS — R109 Unspecified abdominal pain: Secondary | ICD-10-CM | POA: Diagnosis not present

## 2018-10-04 DIAGNOSIS — G4733 Obstructive sleep apnea (adult) (pediatric): Secondary | ICD-10-CM | POA: Diagnosis not present

## 2018-10-13 ENCOUNTER — Other Ambulatory Visit: Payer: Self-pay | Admitting: Family Medicine

## 2018-10-13 DIAGNOSIS — Z1231 Encounter for screening mammogram for malignant neoplasm of breast: Secondary | ICD-10-CM

## 2018-10-13 DIAGNOSIS — M8580 Other specified disorders of bone density and structure, unspecified site: Secondary | ICD-10-CM

## 2018-12-01 DIAGNOSIS — E039 Hypothyroidism, unspecified: Secondary | ICD-10-CM | POA: Diagnosis not present

## 2018-12-01 DIAGNOSIS — E785 Hyperlipidemia, unspecified: Secondary | ICD-10-CM | POA: Diagnosis not present

## 2018-12-01 DIAGNOSIS — I10 Essential (primary) hypertension: Secondary | ICD-10-CM | POA: Diagnosis not present

## 2018-12-23 ENCOUNTER — Ambulatory Visit
Admission: RE | Admit: 2018-12-23 | Discharge: 2018-12-23 | Disposition: A | Discharge status: Home / Self Care | Payer: Medicare Other | Source: Ambulatory Visit | Attending: Family Medicine | Admitting: Family Medicine

## 2018-12-23 ENCOUNTER — Other Ambulatory Visit: Payer: Self-pay

## 2018-12-23 DIAGNOSIS — Z1231 Encounter for screening mammogram for malignant neoplasm of breast: Secondary | ICD-10-CM

## 2018-12-23 DIAGNOSIS — M8580 Other specified disorders of bone density and structure, unspecified site: Secondary | ICD-10-CM

## 2018-12-23 DIAGNOSIS — Z780 Asymptomatic menopausal state: Secondary | ICD-10-CM | POA: Diagnosis not present

## 2018-12-23 DIAGNOSIS — M8589 Other specified disorders of bone density and structure, multiple sites: Secondary | ICD-10-CM | POA: Diagnosis not present

## 2019-01-12 DIAGNOSIS — Z961 Presence of intraocular lens: Secondary | ICD-10-CM | POA: Diagnosis not present

## 2019-01-28 DIAGNOSIS — Z23 Encounter for immunization: Secondary | ICD-10-CM | POA: Diagnosis not present

## 2019-05-06 DIAGNOSIS — Z96652 Presence of left artificial knee joint: Secondary | ICD-10-CM | POA: Diagnosis not present

## 2019-05-06 DIAGNOSIS — M1711 Unilateral primary osteoarthritis, right knee: Secondary | ICD-10-CM | POA: Diagnosis not present

## 2019-05-18 ENCOUNTER — Ambulatory Visit: Payer: Medicare Other | Attending: Internal Medicine

## 2019-05-18 DIAGNOSIS — Z23 Encounter for immunization: Secondary | ICD-10-CM | POA: Insufficient documentation

## 2019-05-23 DIAGNOSIS — M25562 Pain in left knee: Secondary | ICD-10-CM | POA: Diagnosis not present

## 2019-05-25 ENCOUNTER — Ambulatory Visit (INDEPENDENT_AMBULATORY_CARE_PROVIDER_SITE_OTHER): Payer: Medicare Other | Admitting: Internal Medicine

## 2019-05-25 ENCOUNTER — Other Ambulatory Visit: Payer: Self-pay

## 2019-05-25 ENCOUNTER — Encounter: Payer: Self-pay | Admitting: Internal Medicine

## 2019-05-25 VITALS — BP 132/68 | HR 62 | Ht 64.5 in | Wt 214.5 lb

## 2019-05-25 DIAGNOSIS — I10 Essential (primary) hypertension: Secondary | ICD-10-CM | POA: Diagnosis not present

## 2019-05-25 DIAGNOSIS — E785 Hyperlipidemia, unspecified: Secondary | ICD-10-CM

## 2019-05-25 DIAGNOSIS — R0789 Other chest pain: Secondary | ICD-10-CM

## 2019-05-25 DIAGNOSIS — I422 Other hypertrophic cardiomyopathy: Secondary | ICD-10-CM

## 2019-05-25 NOTE — Patient Instructions (Signed)
Medication Instructions:  Your physician has recommended you make the following change in your medication:  1- Okay to stop aspirin.  *If you need a refill on your cardiac medications before your next appointment, please call your pharmacy*  Lab Work: none If you have labs (blood work) drawn today and your tests are completely normal, you will receive your results only by: Marland Kitchen MyChart Message (if you have MyChart) OR . A paper copy in the mail If you have any lab test that is abnormal or we need to change your treatment, we will call you to review the results.  Testing/Procedures: none  Follow-Up: At [facility], you and your health needs are our priority.  As part of our continuing mission to provide you with exceptional heart care, we have created designated Provider Care Teams.  These Care Teams include your primary Cardiologist (physician) and Advanced Practice Providers (APPs -  Physician Assistants and Nurse Practitioners) who all work together to provide you with the care you need, when you need it.  Your next appointment:   6 month(s)  The format for your next appointment:   In Person  Provider:    You may see [name], MD or one of the following Advanced Practice Providers on your designated Care Team:    Murray Hodgkins, NP  Christell Faith, PA-C  Marrianne Mood, PA-C

## 2019-05-25 NOTE — Progress Notes (Signed)
Follow-up Outpatient Visit Date: 05/25/2019  Primary Care Provider: [name], MD [place] [id number]  Chief Complaint: Follow-up hypertrophic cardiomyopathy  HPI:  Cheryl Velez is a 79 y.o. female with history of apical hypertrophic cardiomyopathy, hypertension, hyperlipidemia, obstructive sleep apnea, hypothyroidism, osteoarthritis, and PTSD, who presents for follow-up of hypertrophic cardiomyopathy.  We last spoke in 08/2018, at which time Cheryl Velez was feeling well.  She noted rare episodes of sharp, substernal chest pain happening about once a month.  There were no clear precipitants, with the pain abating on its own over the course of 2 to 3 minutes.  She denied dyspnea, palpitations, lightheadedness, and edema.  We deferred additional treatment and testing at that time.  Further genetic testing was not recommended by Dr. Broadus John.  Today, Cheryl Velez reports that she has been feeling quite well.  She notes one episode of recent chest tightness.  Her discomfort usually occurs at rest and comes and goes over the course of about half an hour.  It is happening twice a month and does not affect her usual activities.  It has been stable in frequency and intensity.  She has not had any significant shortness of breath.  She notes one episode of lightheadedness/unsteadiness that occurred after shower.  She later realized that she had forgotten to take her metoprolol and wonders if that could have contributed to her symptoms.  She has not had any orthopnea, PND, or edema.  She received her first COVID-19 vaccine last week.  --------------------------------------------------------------------------------------------------  Cardiovascular History & Procedures: Cardiovascular Problems:  Atypical chest pain  Apical hypertrophic cardiomyopathy  Risk Factors:  Hypertension, hyperlipidemia, obesity, and age greater than 90  Cath/PCI:  LHC (06/23/17): Minimal luminal  irregularities involving the LMCA, LAD, and RCA. Hyperdynamic LVEF with suggestion of apical hypertrophy. LVEDP 24-26 mmHg.  CV Surgery:  None  EP Procedures and Devices:  48-hour Holter monitor (07/30/2017): Predominantly sinus rhythm with rare PACs and PVCs. No significant arrhythmias. Average heart rate 64 bpm.  Non-Invasive Evaluation(s):  TTE (07/06/17): Normal LV size with moderate LVH. LVEF 55-60% with grade 2 diastolic dysfunction. Trivial aortic regurgitation. Mild left atrial enlargement. Normal RV size and function. Upper normal PA pressure.  Exercise MPI (01/08/13): No ischemia or scar. LVEF 61%.  Recent CV Pertinent Labs: Lab Results  Component Value Date   CHOL 171 01/08/2013   HDL 38 (L) 01/08/2013   LDLCALC 90 01/08/2013   TRIG 215 (H) 01/08/2013   CHOLHDL 4.5 01/08/2013   INR 1.0 06/19/2017   K 4.6 06/19/2017   BUN 17 06/19/2017   CREATININE 0.81 06/19/2017    Past medical and surgical history were reviewed and updated in EPIC.  Current Meds  Medication Sig  . aspirin EC 81 MG tablet Take 81 mg by mouth at bedtime.   Marland Kitchen b complex vitamins tablet Take 1 tablet by mouth at bedtime. SUPER B COMPLEX  . Calcium Carb-Cholecalciferol (CALCIUM 600+D3 PO) Take 1 tablet by mouth at bedtime.  . Cholecalciferol (VITAMIN D3) 2000 units TABS Take 2,000 Units by mouth at bedtime.  . cholestyramine light (PREVALITE) 4 g packet Take 4 g by mouth 2 (two) times daily.  . fenofibrate 160 MG tablet Take 160 mg by mouth at bedtime.   . hyoscyamine ([name]) 0.125 MG tablet Take 0.125 mg by mouth every 4 (four) hours as needed for cramping.  Marland Kitchen levothyroxine (SYNTHROID, LEVOTHROID) 75 MCG tablet Take 75 mcg by mouth daily at 2 am.   .  losartan (COZAAR) 100 MG tablet Take 100 mg by mouth at bedtime.   . metoprolol tartrate (LOPRESSOR) 25 MG tablet Take 0.5 tablets (12.5 mg total) by mouth 2 (two) times daily.  . Multiple Vitamin (MULTIVITAMIN WITH MINERALS) TABS tablet Take 1  tablet by mouth at bedtime.   . naproxen sodium (ANAPROX) 550 MG tablet Take 550 mg by mouth 2 (two) times daily as needed (FOR PAIN.).    Allergies: Lipitor [atorvastatin], Paxil [paroxetine hcl], Shellfish allergy, Latex, Nickel, and Other  Social History   Tobacco Use  . Smoking status: Never Smoker  . Smokeless tobacco: Never Used  Substance Use Topics  . Alcohol use: No  . Drug use: No    Family History  Problem Relation Age of Onset  . Stroke Mother   . Hypertension Mother   . Colon polyps Mother   . Suicidality Sister   . Hypertension Brother 36  . Heart attack Brother        Sudden cardiac death  . Hypertension Father   . CAD Father   . Cirrhosis Father   . CAD Maternal Grandmother   . CAD Maternal Grandfather   . Breast cancer Paternal Grandmother   . CAD Paternal Grandmother   . CAD Paternal Grandfather   . Heart Problems Brother 27  . Diabetes Brother 8  . Hypertension Brother   . Heart Problems Brother   . Hypertension Sister   . Obesity Sister   . Diverticulitis Sister   . Hypertension Sister   . Gout Sister   . Alcohol abuse Sister   . Polycythemia Sister   . Other Brother        MURDERED  . Hypertension Brother   . CVA Brother   . Blindness Brother     Review of Systems: A 12-system review of systems was performed and was negative except as noted in the HPI.  --------------------------------------------------------------------------------------------------  Physical Exam: BP 132/68 (BP Location: Left Arm, Patient Position: Sitting, Cuff Size: Large)   Pulse 62   Ht 5' 4.5" (1.638 m)   Wt 214 lb 8 oz (97.3 kg)   BMI 36.25 kg/m   General: NAD. HEENT: No conjunctival pallor or scleral icterus. Facemask in place. Neck: Supple without lymphadenopathy, thyromegaly, JVD, or HJR. Lungs: Normal work of breathing. Clear to auscultation bilaterally without wheezes or crackles. Heart: Regular rate and rhythm without murmurs, rubs, or  gallops. Abd: Bowel sounds present. Soft, NT/ND without hepatosplenomegaly Ext: No lower extremity edema. Radial, PT, and DP pulses are 2+ bilaterally. Skin: Warm and dry without rash.  EKG: Normal sinus rhythm with borderline LVH as well as anterolateral and inferior ST/T changes.  Findings consistent with history of apical hypertrophic cardiomyopathy.  No significant change from 04/05/2018.  Lab Results  Component Value Date   WBC 7.9 06/19/2017   HGB 12.5 06/19/2017   HCT 37.3 06/19/2017   MCV 89 06/19/2017   PLT 331 06/19/2017    Lab Results  Component Value Date   NA 142 06/19/2017   K 4.6 06/19/2017   CL 106 06/19/2017   CO2 23 06/19/2017   BUN 17 06/19/2017   CREATININE 0.81 06/19/2017   GLUCOSE 76 06/19/2017   ALT 21 05/03/2016    Lab Results  Component Value Date   CHOL 171 01/08/2013   HDL 38 (L) 01/08/2013   LDLCALC 90 01/08/2013   TRIG 215 (H) 01/08/2013   CHOLHDL 4.5 01/08/2013    --------------------------------------------------------------------------------------------------  ASSESSMENT AND PLAN: Hypertrophic cardiomyopathy: Ms. Windecker  is doing well with rare, self-limited episodes of atypical chest pain.  Prior evaluation has not shown any high risk features.  Heart rate is well controlled today without evidence of heart failure.  Continue low-dose metoprolol.  Chest pain: Nonspecific and potentially related to HCM.  Prior catheterization showed minimal CAD.  In light of this, we have agreed to discontinue aspirin.  She is not on a statin given history of intolerance to atorvastatin.  Hypertension: Blood pressure upper normal today.  Continue current doses of metoprolol tartrate and losartan.  Hyperlipidemia: Ms. Ericksen has been intolerant of atorvastatin in the past.  Continue fenofibrate with ongoing management per Dr. Dema Severin.  Follow-up: Return to clinic in 6 months.  [name], MD 05/25/2019 10:57 AM

## 2019-06-06 ENCOUNTER — Ambulatory Visit: Payer: Medicare Other | Attending: Internal Medicine

## 2019-06-06 DIAGNOSIS — Z23 Encounter for immunization: Secondary | ICD-10-CM

## 2019-06-06 NOTE — Progress Notes (Signed)
   Covid-19 Vaccination Clinic  Name:  Cheryl Velez    MRN: [id number] DOB: August 19, 1940  06/06/2019  [name] was observed post Covid-19 immunization for 15 minutes without incidence. She was provided with Vaccine Information Sheet and instruction to access the V-Safe system.   [name] was instructed to call 911 with any severe reactions post vaccine: Marland Kitchen Difficulty breathing  . Swelling of your face and throat  . A fast heartbeat  . A bad rash all over your body  . Dizziness and weakness    Immunizations Administered    Name Date Dose VIS Date Route   Pfizer COVID-19 Vaccine 06/06/2019  4:26 PM 0.3 mL 04/08/2019 Intramuscular   Manufacturer: Mila Doce   Lot: [id number]   [place]: [id number]

## 2019-07-18 DIAGNOSIS — E039 Hypothyroidism, unspecified: Secondary | ICD-10-CM | POA: Diagnosis not present

## 2019-07-18 DIAGNOSIS — E785 Hyperlipidemia, unspecified: Secondary | ICD-10-CM | POA: Diagnosis not present

## 2019-07-18 DIAGNOSIS — R0789 Other chest pain: Secondary | ICD-10-CM | POA: Diagnosis not present

## 2019-07-18 DIAGNOSIS — I10 Essential (primary) hypertension: Secondary | ICD-10-CM | POA: Diagnosis not present

## 2019-07-19 DIAGNOSIS — I10 Essential (primary) hypertension: Secondary | ICD-10-CM | POA: Diagnosis not present

## 2019-10-05 DIAGNOSIS — G4733 Obstructive sleep apnea (adult) (pediatric): Secondary | ICD-10-CM | POA: Diagnosis not present

## 2019-11-12 DIAGNOSIS — M19012 Primary osteoarthritis, left shoulder: Secondary | ICD-10-CM | POA: Diagnosis not present

## 2019-11-29 NOTE — Progress Notes (Deleted)
Follow-up Outpatient Visit Date: 11/30/2019  Primary Care Provider: [name], MD [place] [id number]  Chief Complaint: ***  HPI:  Cheryl Velez is a 79 y.o. female with history of apical hypertrophic cardiomyopathy, hypertension, hyperlipidemia, obstructive sleep apnea, hypothyroidism, osteoarthritis, and PTSD, who presents for follow-up of hypertrophic cardiomyopathy.  I last saw her in late January, at which time this point was feeling well.  She notED sporadic episodes of chest tightness, typically happening once or twice a month at rest.  Chest pain was felt to be atypical.  We agreed to stop aspirin, given prior catheterization in 2019 showing minimal CAD.  --------------------------------------------------------------------------------------------------  Cardiovascular History & Procedures: Cardiovascular Problems:  Atypical chest pain  Apical hypertrophic cardiomyopathy  Risk Factors:  Hypertension, hyperlipidemia, obesity, and age greater than 37  Cath/PCI:  LHC (06/23/17): Minimal luminal irregularities involving the LMCA, LAD, and RCA. Hyperdynamic LVEF with suggestion of apical hypertrophy. LVEDP 24-26 mmHg.  CV Surgery:  None  EP Procedures and Devices:  48-hour Holter monitor (07/30/2017): Predominantly sinus rhythm with rare PACs and PVCs. No significant arrhythmias. Average heart rate 64 bpm.  Non-Invasive Evaluation(s):  TTE (07/06/17): Normal LV size with moderate LVH. LVEF 55-60% with grade 2 diastolic dysfunction. Trivial aortic regurgitation. Mild left atrial enlargement. Normal RV size and function. Upper normal PA pressure.  Exercise MPI (01/08/13): No ischemia or scar. LVEF 61%.  Recent CV Pertinent Labs: Lab Results  Component Value Date   CHOL 171 01/08/2013   HDL 38 (L) 01/08/2013   LDLCALC 90 01/08/2013   TRIG 215 (H) 01/08/2013   CHOLHDL 4.5 01/08/2013   INR 1.0 06/19/2017   K 4.6 06/19/2017   BUN 17  06/19/2017   CREATININE 0.81 06/19/2017    Past medical and surgical history were reviewed and updated in EPIC.  No outpatient medications have been marked as taking for the 11/30/19 encounter (Appointment) with Cheryl Velez, [name], MD.    Allergies: Lipitor [atorvastatin], Paxil [paroxetine hcl], Shellfish allergy, Latex, Nickel, and Other  Social History   Tobacco Use  . Smoking status: Never Smoker  . Smokeless tobacco: Never Used  Vaping Use  . Vaping Use: Never used  Substance Use Topics  . Alcohol use: No  . Drug use: No    Family History  Problem Relation Age of Onset  . Stroke Mother   . Hypertension Mother   . Colon polyps Mother   . Suicidality Sister   . Hypertension Brother 28  . Heart attack Brother        Sudden cardiac death  . Hypertension Father   . CAD Father   . Cirrhosis Father   . CAD Maternal Grandmother   . CAD Maternal Grandfather   . Breast cancer Paternal Grandmother   . CAD Paternal Grandmother   . CAD Paternal Grandfather   . Heart Problems Brother 65  . Diabetes Brother 18  . Hypertension Brother   . Heart Problems Brother   . Hypertension Sister   . Obesity Sister   . Diverticulitis Sister   . Hypertension Sister   . Gout Sister   . Alcohol abuse Sister   . Polycythemia Sister   . Other Brother        MURDERED  . Hypertension Brother   . CVA Brother   . Blindness Brother     Review of Systems: A 12-system review of systems was performed and was negative except as noted in the HPI.  --------------------------------------------------------------------------------------------------  Physical Exam:  There were no vitals taken for this visit.  General:  *** HEENT: No conjunctival pallor or scleral icterus. Facemask in place. Neck: Supple without lymphadenopathy, thyromegaly, JVD, or HJR. Lungs: Normal work of breathing. Clear to auscultation bilaterally without wheezes or crackles. Heart: Regular rate and rhythm without murmurs,  rubs, or gallops. Non-displaced PMI. Abd: Bowel sounds present. Soft, NT/ND without hepatosplenomegaly Ext: No lower extremity edema. Radial, PT, and DP pulses are 2+ bilaterally. Skin: Warm and dry without rash.  EKG:  ***  Lab Results  Component Value Date   WBC 7.9 06/19/2017   HGB 12.5 06/19/2017   HCT 37.3 06/19/2017   MCV 89 06/19/2017   PLT 331 06/19/2017    Lab Results  Component Value Date   NA 142 06/19/2017   K 4.6 06/19/2017   CL 106 06/19/2017   CO2 23 06/19/2017   BUN 17 06/19/2017   CREATININE 0.81 06/19/2017   GLUCOSE 76 06/19/2017   ALT 21 05/03/2016    Lab Results  Component Value Date   CHOL 171 01/08/2013   HDL 38 (L) 01/08/2013   LDLCALC 90 01/08/2013   TRIG 215 (H) 01/08/2013   CHOLHDL 4.5 01/08/2013    --------------------------------------------------------------------------------------------------  ASSESSMENT AND PLAN: [name], MD 11/29/2019 8:49 PM

## 2019-11-30 ENCOUNTER — Ambulatory Visit: Payer: Medicare Other | Admitting: Internal Medicine

## 2019-12-01 ENCOUNTER — Encounter: Payer: Self-pay | Admitting: Internal Medicine

## 2020-02-21 ENCOUNTER — Telehealth: Payer: Self-pay | Admitting: Internal Medicine

## 2020-02-21 NOTE — Telephone Encounter (Signed)
Records received in July 2021 for appt on 11/30/19 with Dr. Saunders Revel, which pt no-showed.   Records to be scanned into pt chart and message sent to scheduling. Pt was supposed to follow-up 6 months after January appointment.

## 2020-02-22 ENCOUNTER — Ambulatory Visit (INDEPENDENT_AMBULATORY_CARE_PROVIDER_SITE_OTHER): Payer: Medicare Other | Admitting: Internal Medicine

## 2020-02-22 ENCOUNTER — Encounter: Payer: Self-pay | Admitting: Internal Medicine

## 2020-02-22 ENCOUNTER — Other Ambulatory Visit: Payer: Self-pay

## 2020-02-22 VITALS — BP 120/78 | HR 60 | Ht 64.5 in | Wt 211.0 lb

## 2020-02-22 DIAGNOSIS — I422 Other hypertrophic cardiomyopathy: Secondary | ICD-10-CM

## 2020-02-22 DIAGNOSIS — I10 Essential (primary) hypertension: Secondary | ICD-10-CM | POA: Diagnosis not present

## 2020-02-22 DIAGNOSIS — E785 Hyperlipidemia, unspecified: Secondary | ICD-10-CM

## 2020-02-22 NOTE — Progress Notes (Signed)
Follow-up Outpatient Visit Date: 02/22/2020  Primary Care Provider: [name], MD [place] [address]  Chief Complaint: Follow-up hypertrophic cardiomyopathy  HPI:  Cheryl Velez is a 79 y.o. female with history of apical hypertrophic cardiomyopathy, hypertension, hyperlipidemia, obstructive sleep apnea, hypothyroidism, osteoarthritis, and PTSD, who presents for follow-up of hypertrophic cardiomyopathy. I last saw Cheryl Velez in January, at which time she was doing well other than rare episodes of chest tightness that typically come and go over the course of half an hour.  Today, Cheryl Velez reports she has been doing relatively well. She contracted COVID-19 in July, despite being vaccinated, and had a lengthy recovery. She now feels back to baseline. She has not had any chest pain, shortness of breath, palpitations, or edema. She has sporadic, brief lightheadedness when she stands up, especially first thing in the morning. She has not fallen or passed out.  --------------------------------------------------------------------------------------------------  Cardiovascular History & Procedures: Cardiovascular Problems:  Atypical chest pain  Apical hypertrophic cardiomyopathy  Risk Factors:  Hypertension, hyperlipidemia, obesity, and age greater than 78  Cath/PCI:  LHC (06/23/17): Minimal luminal irregularities involving the LMCA, LAD, and RCA. Hyperdynamic LVEF with suggestion of apical hypertrophy. LVEDP 24-26 mmHg.  CV Surgery:  None  EP Procedures and Devices:  48-hour Holter monitor (07/30/2017): Predominantly sinus rhythm with rare PACs and PVCs. No significant arrhythmias. Average heart rate 64 bpm.  Non-Invasive Evaluation(s):  TTE (07/06/17): Normal LV size with moderate LVH. LVEF 55-60% with grade 2 diastolic dysfunction. Trivial aortic regurgitation. Mild left atrial enlargement. Normal RV size and function. Upper normal PA  pressure.  Exercise MPI (01/08/13): No ischemia or scar. LVEF 61%.  Recent CV Pertinent Labs: Lab Results  Component Value Date   CHOL 171 01/08/2013   HDL 38 (L) 01/08/2013   LDLCALC 90 01/08/2013   TRIG 215 (H) 01/08/2013   CHOLHDL 4.5 01/08/2013   INR 1.0 06/19/2017   K 4.6 06/19/2017   BUN 17 06/19/2017   CREATININE 0.81 06/19/2017    Past medical and surgical history were reviewed and updated in EPIC.  Current Meds  Medication Sig  . albuterol (VENTOLIN HFA) 108 (90 Base) MCG/ACT inhaler Inhale into the lungs.  Marland Kitchen b complex vitamins tablet Take 1 tablet by mouth at bedtime. SUPER B COMPLEX  . Betamethasone Sodium Phosphate 6 MG/ML SOLN SMARTSIG:Left Ear  . Calcium Carb-Cholecalciferol (CALCIUM 600+D3 PO) Take 1 tablet by mouth at bedtime.  . Cholecalciferol (VITAMIN D3) 2000 units TABS Take 2,000 Units by mouth at bedtime.  . cholestyramine light (PREVALITE) 4 g packet Take 4 g by mouth 2 (two) times daily.  . fenofibrate 160 MG tablet Take 160 mg by mouth at bedtime.   . hyoscyamine ([name]) 0.125 MG tablet Take 0.125 mg by mouth every 4 (four) hours as needed for cramping.  Marland Kitchen levothyroxine (SYNTHROID, LEVOTHROID) 75 MCG tablet Take 75 mcg by mouth daily at 2 am.   . losartan (COZAAR) 100 MG tablet Take 100 mg by mouth at bedtime.   . metoprolol tartrate (LOPRESSOR) 25 MG tablet Take 0.5 tablets (12.5 mg total) by mouth 2 (two) times daily.  . Multiple Vitamin (MULTIVITAMIN WITH MINERALS) TABS tablet Take 1 tablet by mouth at bedtime.   . naproxen sodium (ANAPROX) 550 MG tablet Take 550 mg by mouth 2 (two) times daily as needed (FOR PAIN.).  Marland Kitchen pantoprazole (PROTONIX) 40 MG tablet Take 40 mg by mouth daily.    Allergies: Lipitor [atorvastatin], Paxil [paroxetine hcl], Shellfish  allergy, Latex, Nickel, and Other  Social History   Tobacco Use  . Smoking status: Never Smoker  . Smokeless tobacco: Never Used  Vaping Use  . Vaping Use: Never used  Substance Use Topics  .  Alcohol use: No  . Drug use: No    Family History  Problem Relation Age of Onset  . Stroke Mother   . Hypertension Mother   . Colon polyps Mother   . Suicidality Sister   . Hypertension Brother 70  . Heart attack Brother        Sudden cardiac death  . Hypertension Father   . CAD Father   . Cirrhosis Father   . CAD Maternal Grandmother   . CAD Maternal Grandfather   . Breast cancer Paternal Grandmother   . CAD Paternal Grandmother   . CAD Paternal Grandfather   . Heart Problems Brother 20  . Diabetes Brother 42  . Hypertension Brother   . Heart Problems Brother   . Hypertension Sister   . Obesity Sister   . Diverticulitis Sister   . Hypertension Sister   . Gout Sister   . Alcohol abuse Sister   . Polycythemia Sister   . Other Brother        MURDERED  . Hypertension Brother   . CVA Brother   . Blindness Brother     Review of Systems: A 12-system review of systems was performed and was negative except as noted in the HPI.  --------------------------------------------------------------------------------------------------  Physical Exam: BP 120/78 (BP Location: Left Arm, Patient Position: Sitting, Cuff Size: Large)   Pulse 60   Ht 5' 4.5" (1.638 m)   Wt 211 lb (95.7 kg)   SpO2 97%   BMI 35.66 kg/m   General: NAD. Neck: No JVD or HJR. Lungs: Clear to auscultation bilaterally without wheezes or crackles. Heart: Regular rate and rhythm without murmurs, rubs, or gallops. Abdomen: Soft, nontender, nondistended. Extremities: No lower extremity edema.  EKG: Normal sinus rhythm with LVH and anterolateral ST/T changes consistent with history of apical hypertrophic cardiomyopathy. No significant change from prior tracing on 05/25/2019.  Lab Results  Component Value Date   WBC 7.9 06/19/2017   HGB 12.5 06/19/2017   HCT 37.3 06/19/2017   MCV 89 06/19/2017   PLT 331 06/19/2017    Lab Results  Component Value Date   NA 142 06/19/2017   K 4.6 06/19/2017   CL 106  06/19/2017   CO2 23 06/19/2017   BUN 17 06/19/2017   CREATININE 0.81 06/19/2017   GLUCOSE 76 06/19/2017   ALT 21 05/03/2016    Lab Results  Component Value Date   CHOL 171 01/08/2013   HDL 38 (L) 01/08/2013   LDLCALC 90 01/08/2013   TRIG 215 (H) 01/08/2013   CHOLHDL 4.5 01/08/2013    --------------------------------------------------------------------------------------------------  ASSESSMENT AND PLAN: Apical hypertrophic cardiomyopathy: Ms. Marse continues to do well with NYHA class I symptoms. She has not had any syncope/near syncope. Her heart rate is well controlled today. We will defer medication changes at this time. We have again discussed the potential for passing HCM trait on to her children; I have asked her to speak with her family, if she has not already done so, to ensure that they are screened appropriately.  Hypertension: Blood pressure well controlled today. Continue current doses of metoprolol and losartan.  Hyperlipidemia: Ongoing management per Dr. Dema Severin.  Follow-up: Return to clinic in 1 year.  [name], MD 02/22/2020 9:19 AM

## 2020-02-22 NOTE — Patient Instructions (Signed)
Medication Instructions:  Your physician recommends that you continue on your current medications as directed. Please refer to the Current Medication list given to you today.  *If you need a refill on your cardiac medications before your next appointment, please call your pharmacy*  Follow-Up: At [facility], you and your health needs are our priority.  As part of our continuing mission to provide you with exceptional heart care, we have created designated Provider Care Teams.  These Care Teams include your primary Cardiologist (physician) and Advanced Practice Providers (APPs -  Physician Assistants and Nurse Practitioners) who all work together to provide you with the care you need, when you need it.  We recommend signing up for the patient portal called "MyChart".  Sign up information is provided on this After Visit Summary.  MyChart is used to connect with patients for Virtual Visits (Telemedicine).  Patients are able to view lab/test results, encounter notes, upcoming appointments, etc.  Non-urgent messages can be sent to your provider as well.   To learn more about what you can do with MyChart, go to [email or web address].    Your next appointment:   12 month(s)  The format for your next appointment:   In Person  Provider:   You may see [name], MD or one of the following Advanced Practice Providers on your designated Care Team:    Murray Hodgkins, NP  Christell Faith, PA-C  Marrianne Mood, PA-C  Cadence Overton, [place]

## 2020-05-29 DIAGNOSIS — E785 Hyperlipidemia, unspecified: Secondary | ICD-10-CM | POA: Diagnosis not present

## 2020-05-29 DIAGNOSIS — R7303 Prediabetes: Secondary | ICD-10-CM | POA: Diagnosis not present

## 2020-05-29 DIAGNOSIS — I10 Essential (primary) hypertension: Secondary | ICD-10-CM | POA: Diagnosis not present

## 2020-05-29 DIAGNOSIS — E039 Hypothyroidism, unspecified: Secondary | ICD-10-CM | POA: Diagnosis not present

## 2020-09-05 DIAGNOSIS — M1711 Unilateral primary osteoarthritis, right knee: Secondary | ICD-10-CM | POA: Diagnosis not present

## 2020-09-05 DIAGNOSIS — Z471 Aftercare following joint replacement surgery: Secondary | ICD-10-CM | POA: Diagnosis not present

## 2020-09-05 DIAGNOSIS — Z96652 Presence of left artificial knee joint: Secondary | ICD-10-CM | POA: Diagnosis not present

## 2020-09-26 DIAGNOSIS — M1711 Unilateral primary osteoarthritis, right knee: Secondary | ICD-10-CM | POA: Diagnosis not present

## 2020-10-03 DIAGNOSIS — M1711 Unilateral primary osteoarthritis, right knee: Secondary | ICD-10-CM | POA: Diagnosis not present

## 2020-10-10 DIAGNOSIS — M1711 Unilateral primary osteoarthritis, right knee: Secondary | ICD-10-CM | POA: Diagnosis not present

## 2020-10-10 DIAGNOSIS — G4733 Obstructive sleep apnea (adult) (pediatric): Secondary | ICD-10-CM | POA: Diagnosis not present

## 2020-11-08 ENCOUNTER — Telehealth: Payer: Self-pay | Admitting: Internal Medicine

## 2020-11-08 NOTE — Telephone Encounter (Signed)
Pt c/o of Chest Pain: STAT if CP now or developed within 24 hours  1. Are you having CP right now? No, not now but woke patient up this morning around 6 am  2. Are you experiencing any other symptoms (ex. SOB, nausea, vomiting, sweating)? no  3. How long have you been experiencing CP? About a week  4. Is your CP continuous or coming and going? Comes and goes  5. Have you taken Nitroglycerin? no ?

## 2020-11-08 NOTE — Telephone Encounter (Signed)
Spoke with patient and she reports having chest pain that is intermittent. She describes it as tightness and to the upper center left of her chest. The pain woke her up this morning but that it happens during different times with no noticeable pattern. Currently she feels it as tightness and states this is different and concerning. Inquired if she had any blood pressure readings and she reports the last few readings:  144/61 132/59 134/60 135/59 While on the phone she checked and that one was 128/56.   We discussed her symptoms and given her current description I recommended that she proceed to the emergency room for further evaluation and work up. She verbalized understanding of our conversation and was agreeable to seek further care.

## 2020-11-14 ENCOUNTER — Emergency Department: Payer: Medicare Other

## 2020-11-14 ENCOUNTER — Other Ambulatory Visit: Payer: Self-pay

## 2020-11-14 ENCOUNTER — Emergency Department
Admission: EM | Admit: 2020-11-14 | Discharge: 2020-11-14 | Disposition: A | Discharge status: Home / Self Care | Payer: Medicare Other | Attending: Student in an Organized Health Care Education/Training Program | Admitting: Student in an Organized Health Care Education/Training Program

## 2020-11-14 DIAGNOSIS — E039 Hypothyroidism, unspecified: Secondary | ICD-10-CM | POA: Insufficient documentation

## 2020-11-14 DIAGNOSIS — R0789 Other chest pain: Secondary | ICD-10-CM

## 2020-11-14 DIAGNOSIS — Z79899 Other long term (current) drug therapy: Secondary | ICD-10-CM | POA: Insufficient documentation

## 2020-11-14 DIAGNOSIS — Z91040 Latex allergy status: Secondary | ICD-10-CM | POA: Insufficient documentation

## 2020-11-14 DIAGNOSIS — R079 Chest pain, unspecified: Secondary | ICD-10-CM | POA: Diagnosis not present

## 2020-11-14 DIAGNOSIS — I700 Atherosclerosis of aorta: Secondary | ICD-10-CM | POA: Diagnosis not present

## 2020-11-14 DIAGNOSIS — I10 Essential (primary) hypertension: Secondary | ICD-10-CM | POA: Diagnosis not present

## 2020-11-14 DIAGNOSIS — Z9049 Acquired absence of other specified parts of digestive tract: Secondary | ICD-10-CM | POA: Diagnosis not present

## 2020-11-14 LAB — HEPATIC FUNCTION PANEL
ALT: 15 U/L (ref 0–44)
AST: 26 U/L (ref 15–41)
Albumin: 3.9 g/dL (ref 3.5–5.0)
Alkaline Phosphatase: 43 U/L (ref 38–126)
Bilirubin, Direct: 0.2 mg/dL (ref 0.0–0.2)
Indirect Bilirubin: 0.8 mg/dL (ref 0.3–0.9)
Total Bilirubin: 1 mg/dL (ref 0.3–1.2)
Total Protein: 7 g/dL (ref 6.5–8.1)

## 2020-11-14 LAB — D-DIMER, QUANTITATIVE: D-Dimer, Quant: 0.27 ug/mL-FEU (ref 0.00–0.50)

## 2020-11-14 LAB — CBC
HCT: 40.3 % (ref 36.0–46.0)
Hemoglobin: 13.1 g/dL (ref 12.0–15.0)
MCH: 29.8 pg (ref 26.0–34.0)
MCHC: 32.5 g/dL (ref 30.0–36.0)
MCV: 91.6 fL (ref 80.0–100.0)
Platelets: 151 10*3/uL (ref 150–400)
RBC: 4.4 MIL/uL (ref 3.87–5.11)
RDW: 13 % (ref 11.5–15.5)
WBC: 7.2 10*3/uL (ref 4.0–10.5)
nRBC: 0 % (ref 0.0–0.2)

## 2020-11-14 LAB — BASIC METABOLIC PANEL
Anion gap: 11 (ref 5–15)
BUN: 17 mg/dL (ref 8–23)
CO2: 20 mmol/L — ABNORMAL LOW (ref 22–32)
Calcium: 9.7 mg/dL (ref 8.9–10.3)
Chloride: 104 mmol/L (ref 98–111)
Creatinine, Ser: 0.65 mg/dL (ref 0.44–1.00)
GFR, Estimated: >60 mL/min (ref >60)
Glucose, Bld: 123 mg/dL — ABNORMAL HIGH (ref 70–99)
Potassium: 4.3 mmol/L (ref 3.5–5.1)
Sodium: 135 mmol/L (ref 135–145)

## 2020-11-14 LAB — LIPASE, BLOOD: Lipase: 31 U/L (ref 11–51)

## 2020-11-14 LAB — TROPONIN I (HIGH SENSITIVITY)
Troponin I (High Sensitivity): 17 ng/L (ref <18)
Troponin I (High Sensitivity): 22 ng/L — ABNORMAL HIGH (ref <18)

## 2020-11-14 MED ORDER — ALUM & MAG HYDROXIDE-SIMETH 200-200-20 MG/5ML PO SUSP
30.0000 mL | Freq: Once | ORAL | Status: AC
  Administered 2020-11-14: 30 mL via ORAL
  Filled 2020-11-14: qty 30

## 2020-11-14 MED ORDER — LIDOCAINE VISCOUS HCL 2 % MT SOLN
15.0000 mL | Freq: Once | OROMUCOSAL | Status: AC
  Administered 2020-11-14: 15 mL via ORAL
  Filled 2020-11-14: qty 15

## 2020-11-14 MED ORDER — ISOSORBIDE MONONITRATE ER 30 MG PO TB24: 30.0000 mg | ORAL_TABLET | Freq: Every day | ORAL | 0 refills | Status: DC

## 2020-11-14 MED ORDER — ISOSORBIDE MONONITRATE ER 60 MG PO TB24
30.0000 mg | ORAL_TABLET | Freq: Every day | ORAL | Status: DC
  Administered 2020-11-14: 30 mg via ORAL
  Filled 2020-11-14: qty 1

## 2020-11-14 NOTE — ED Provider Notes (Signed)
[name] Emergency Department Provider Note    Event Date/Time   First MD Initiated Contact with Patient 11/14/20 1023     (approximate)  I have reviewed the triage vital signs and the nursing notes.   HISTORY  Chief Complaint Chest Pain    HPI Cheryl Velez is a 80 y.o. female with below listed past medical history presents to the ER for evaluation for on and off chest tightness and pain is been ongoing for a little less than 2weeks.  States it comes and goes.  Does feel a squeezing tightness in her chest no significant shortness of breath.  No diaphoresis.  No nausea or vomiting.  No change with position or exertional component.  States that she was having more significant pain last night so she came to the ER this morning.  [name] that she had called her doctor's office on Friday asking for appointment due to similar pain but states that the pain went away.  No new medications.  No fevers.  No congestion.  Past Medical History:  Diagnosis Date   Adenomatous colon polyp    Arthritis    Chest pain 01/06/2013   Chest pain    GI (gastrointestinal bleed)    [name], ADENOMATOUS POLYP 6/14, 3 YEAR FOLLOW UP, 9/17, 3 YEAR FOLLOW UP.   Hearing loss    BIATERAL HEARING AIDS, [name]   Hypercholesterolemia    Hyperlipidemia    Hypertension    DX 8/16   Hypothyroidism    OSA (obstructive sleep apnea)    (PSG 03/21/12 ESS 6, AHI 14/hr REM 44/hr, RDI SAME, O2 min 77% APAP STARTED 12/15), [name]   Osteoarthritis    KNEES, HANDS HAS SEEN [name]   PTSD (post-traumatic stress disorder)    Thyroid disease    Family History  Problem Relation Age of Onset   Stroke Mother    Hypertension Mother    Colon polyps Mother    Suicidality Sister    Hypertension Brother 44   Heart attack Brother        Sudden cardiac death   Hypertension Father    CAD Father    Cirrhosis Father    CAD Maternal Grandmother    CAD Maternal Grandfather    Breast  cancer Paternal 74    CAD Paternal 23    CAD Paternal Grandfather    Heart Problems Brother 36   Diabetes Brother 66   Hypertension Brother    Heart Problems Brother    Hypertension Sister    Obesity Sister    Diverticulitis Sister    Hypertension Sister    Gout Sister    Alcohol abuse Sister    Polycythemia Sister    Other Brother        MURDERED   Hypertension Brother    CVA Brother    Blindness Brother    Past Surgical History:  Procedure Laterality Date   CARDIAC CATHETERIZATION     KNEE SURGERY     LEFT HEART CATH AND CORONARY ANGIOGRAPHY N/A 06/23/2017   Procedure: LEFT HEART CATH AND CORONARY ANGIOGRAPHY;  Surgeon: [name], MD;  Location: [place] [facility];  Service: Cardiovascular;  Laterality: N/A;   Patient Active Problem List   Diagnosis Date Noted   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 Authorizing Provider  isosorbide mononitrate (IMDUR) 30 MG 24 hr tablet Take 1 tablet (30 mg total) by mouth daily. 11/14/20 11/14/21 Yes Merlyn Lot, MD  albuterol (VENTOLIN HFA) 108 (90 Base) MCG/ACT inhaler Inhale into the lungs. 10/20/19   [provider]  b complex vitamins tablet Take 1 tablet by mouth at bedtime. SUPER B COMPLEX    [provider]  Betamethasone Sodium Phosphate 6 MG/ML SOLN SMARTSIG:Left Ear 11/16/19   [provider]  Calcium Carb-Cholecalciferol (CALCIUM 600+D3 PO) Take 1 tablet by mouth at bedtime.    [provider]  Cholecalciferol (VITAMIN D3) 2000 units TABS Take 2,000 Units by mouth at bedtime.    [provider]  cholestyramine light (PREVALITE) 4 g packet Take 4 g by mouth 2 (two) times daily.     [provider]  fenofibrate 160 MG tablet Take 160 mg by mouth at bedtime.     [provider]  hyoscyamine ([name]) 0.125 MG tablet Take 0.125 mg by mouth every 4 (four) hours as needed for cramping.    [provider]  levothyroxine (SYNTHROID, LEVOTHROID) 75 MCG tablet Take 75 mcg by mouth daily at 2 am.     [provider]  losartan (COZAAR) 100 MG tablet Take 100 mg by mouth at bedtime.  12/16/16   [provider]  metoprolol tartrate (LOPRESSOR) 25 MG tablet Take 0.5 tablets (12.5 mg total) by mouth 2 (two) times daily. 09/23/17   End, [name], MD  Multiple Vitamin (MULTIVITAMIN WITH MINERALS) TABS tablet Take 1 tablet by mouth at bedtime.     [provider]  naproxen sodium (ANAPROX) 550 MG tablet Take 550 mg by mouth 2 (two) times daily as needed (FOR PAIN.).    [provider]  pantoprazole (PROTONIX) 40 MG tablet Take 40 mg by mouth daily. 10/26/19   [provider]    Allergies Lipitor [atorvastatin], Paxil [paroxetine hcl], Shellfish allergy, Latex, Nickel, and Other    Social History Social History   Tobacco Use   Smoking status: Never   Smokeless tobacco: Never  Vaping Use   Vaping Use: Never used  Substance Use Topics   Alcohol use: No   Drug use: No    Review of Systems Patient denies headaches, rhinorrhea, blurry vision, numbness, shortness of breath, chest pain, edema, cough, abdominal pain, nausea, vomiting, diarrhea, dysuria, fevers, rashes or hallucinations unless otherwise stated above in HPI. ____________________________________________   PHYSICAL EXAM:  VITAL SIGNS: Vitals:   11/14/20 0853 11/14/20 1024  BP: (!) 158/70 (!) 155/79  Pulse: 66 68  Resp: 14 17  Temp: 97.9 F (36.6 C)   SpO2: 98% 98%    Constitutional: Alert and oriented.  Eyes: Conjunctivae are normal.  Head: Atraumatic. Nose: No congestion/rhinnorhea. Mouth/Throat: Mucous membranes are moist.   Neck:  No stridor. Painless ROM.  Cardiovascular: Normal rate, regular rhythm. Grossly normal heart sounds.  Good peripheral circulation. Respiratory: Normal respiratory effort.  No retractions. Lungs CTAB. Gastrointestinal: Soft and nontender. No distention. No abdominal bruits. No CVA tenderness. Genitourinary:  Musculoskeletal: No lower extremity tenderness nor edema.  No joint effusions. Neurologic:  Normal speech and language. No gross focal neurologic deficits are appreciated. No facial droop Skin:  Skin is warm, dry and intact. No rash noted. Psychiatric: Mood and affect are normal. Speech and behavior are normal.  ____________________________________________   LABS (all labs ordered are listed, but only abnormal results  are displayed)  Results for orders placed or performed during the hospital encounter of 11/14/20 (from the past 24 hour(s))  Basic metabolic panel     Status: Abnormal   Collection Time: 11/14/20  8:56 AM  Result Value Ref Range   Sodium 135 135 - 145 mmol/L   Potassium 4.3 3.5 - 5.1 mmol/L   Chloride 104 98 - 111 mmol/L   CO2 20 (L) 22 - 32 mmol/L   Glucose, Bld 123 (H) 70 - 99 mg/dL   BUN 17 8 - 23 mg/dL   Creatinine, Ser 0.65 0.44 - 1.00 mg/dL   Calcium 9.7 8.9 - 10.3 mg/dL   GFR, Estimated >60 >60 mL/min   Anion gap 11 5 - 15  CBC     Status: None   Collection Time: 11/14/20  8:56 AM  Result Value Ref Range   WBC 7.2 4.0 - 10.5 K/uL   RBC 4.40 3.87 - 5.11 MIL/uL   Hemoglobin 13.1 12.0 - 15.0 g/dL   HCT 40.3 36.0 - 46.0 %   MCV 91.6 80.0 - 100.0 fL   MCH 29.8 26.0 - 34.0 pg   MCHC 32.5 30.0 - 36.0 g/dL   RDW 13.0 11.5 - 15.5 %   Platelets 151 150 - 400 K/uL   nRBC 0.0 0.0 - 0.2 %  Troponin I (High Sensitivity)     Status: None   Collection Time: 11/14/20  8:56 AM  Result Value Ref Range   Troponin I (High Sensitivity) 17 <18 ng/L  Hepatic function panel     Status: None   Collection Time: 11/14/20  8:56 AM  Result Value Ref Range   Total Protein  7.0 6.5 - 8.1 g/dL   Albumin 3.9 3.5 - 5.0 g/dL   AST 26 15 - 41 U/L   ALT 15 0 - 44 U/L   Alkaline Phosphatase 43 38 - 126 U/L   Total Bilirubin 1.0 0.3 - 1.2 mg/dL   Bilirubin, Direct 0.2 0.0 - 0.2 mg/dL   Indirect Bilirubin 0.8 0.3 - 0.9 mg/dL  Lipase, blood     Status: None   Collection Time: 11/14/20  8:56 AM  Result Value Ref Range   Lipase 31 11 - 51 U/L  Troponin I (High Sensitivity)     Status: Abnormal   Collection Time: 11/14/20 10:40 AM  Result Value Ref Range   Troponin I (High Sensitivity) 22 (H) <18 ng/L  D-dimer, quantitative     Status: None   Collection Time: 11/14/20 10:40 AM  Result Value Ref Range   D-Dimer, Quant 0.27 0.00 - 0.50 ug/mL-FEU   ____________________________________________  EKG My review and personal interpretation at Time: 8:52   Indication: chest pain  Rate: 65  Rhythm: sinus Axis: normal Other: normal intervals, diffuse twave abn concistent with most recent tracing ____________________________________________  RADIOLOGY  I personally reviewed all radiographic images ordered to evaluate for the above acute complaints and reviewed radiology reports and findings.  These findings were personally discussed with the patient.  Please see medical record for radiology report.  ____________________________________________   PROCEDURES  Procedure(s) performed:  Procedures    Critical Care performed: no ____________________________________________   INITIAL IMPRESSION / ASSESSMENT AND PLAN / ED COURSE  Pertinent labs & imaging results that were available during my care of the patient were reviewed by me and considered in my medical decision making (see chart for details).   DDX: ACS, pericarditis, esophagitis, boerhaaves, pe, dissection, pna, bronchitis, costochondritis   Alithea M Ryden  is a 80 y.o. who presents to the ED with symptoms as described above.  Patient nontoxic-appearing in no acute distress.  EKG with nonspecific changes but  actually is somewhat improved as compared to previous tracings.  Initial troponin negative.  Low risk by Wells criteria we will send D-dimer to further stratify.  Does not seem consistent with dissection.  Abdominal exam soft benign.  No respiratory symptoms to suggest pneumonia.  Lower suspicion for pleurisy.  Clinical Course as of 11/14/20 1206  Wed Nov 14, 2020  1201 Patient reassessed and is pain-free.  Discussed results of work-up EKG as well as lab results with Dr. Garen Lah of cardiology.  Recommendation is to start patient on Imdur.  As she is pain-free with a reassuring work-up here in the ER will be appropriate for outpatient follow-up.  Patient agreeable to plan. [PR]    Clinical Course User Index [PR] Merlyn Lot, MD    The patient was evaluated in Emergency Department today for the symptoms described in the history of present illness. He/she was evaluated in the context of the global COVID-19 pandemic, which necessitated consideration that the patient might be at risk for infection with the SARS-CoV-2 virus that causes COVID-19. Institutional protocols and algorithms that pertain to the evaluation of patients at risk for COVID-19 are in a state of rapid change based on information released by regulatory bodies including the CDC and federal and state organizations. These policies and algorithms were followed during the patient's care in the ED.  As part of my medical decision making, I reviewed the following data within the [place] notes reviewed and incorporated, Labs reviewed, notes from prior ED visits and [place] Controlled Substance Database   ____________________________________________   FINAL CLINICAL IMPRESSION(S) / ED DIAGNOSES  Final diagnoses:  Atypical chest pain      NEW MEDICATIONS STARTED DURING THIS VISIT:  New Prescriptions   ISOSORBIDE MONONITRATE (IMDUR) 30 MG 24 HR TABLET    Take 1 tablet (30 mg total) by mouth daily.      Note:  This document was prepared using Dragon voice recognition software and may include unintentional dictation errors.    Merlyn Lot, MD 11/14/20 [phone]

## 2020-11-14 NOTE — ED Triage Notes (Signed)
Pt here with CP that has been going on for about a week but has gotten worse since last night. Pt states pain is left sided and she felt it in her back but pain is intermittent. Pt stable in triage.

## 2020-11-14 NOTE — ED Triage Notes (Signed)
First Nurse Note:  C/O chest pain.  States had intermittent pain yesterday, pain became worse overnight. States has had intermittent pain 'for days'  AAOx3.  Skin warm and dry. NAD

## 2020-11-14 NOTE — ED Notes (Signed)
Urine sent to lab if needed 

## 2020-12-05 ENCOUNTER — Ambulatory Visit (INDEPENDENT_AMBULATORY_CARE_PROVIDER_SITE_OTHER): Payer: Medicare Other | Admitting: Internal Medicine

## 2020-12-05 ENCOUNTER — Other Ambulatory Visit: Payer: Self-pay

## 2020-12-05 ENCOUNTER — Encounter: Payer: Self-pay | Admitting: Internal Medicine

## 2020-12-05 VITALS — BP 116/62 | HR 62 | Ht 64.0 in | Wt 202.0 lb

## 2020-12-05 DIAGNOSIS — E781 Pure hyperglyceridemia: Secondary | ICD-10-CM

## 2020-12-05 DIAGNOSIS — I422 Other hypertrophic cardiomyopathy: Secondary | ICD-10-CM

## 2020-12-05 DIAGNOSIS — I10 Essential (primary) hypertension: Secondary | ICD-10-CM | POA: Diagnosis not present

## 2020-12-05 DIAGNOSIS — R0789 Other chest pain: Secondary | ICD-10-CM | POA: Diagnosis not present

## 2020-12-05 MED ORDER — LOSARTAN POTASSIUM 50 MG PO TABS: 50.0000 mg | ORAL_TABLET | Freq: Every day | ORAL | 3 refills | Status: AC

## 2020-12-05 MED ORDER — ISOSORBIDE MONONITRATE ER 30 MG PO TB24: 30.0000 mg | ORAL_TABLET | Freq: Every day | ORAL | 3 refills | Status: DC

## 2020-12-05 NOTE — Patient Instructions (Signed)
Medication Instructions:   Your physician has recommended you make the following change in your medication:   DECREASE Losartan 50 mg daily  We have sent refills for your Isosorbide (Imdur) to your mail order pharmacy.  *If you need a refill on your cardiac medications before your next appointment, please call your pharmacy*   Lab Work:  None ordered  Testing/Procedures:  None ordered   Follow-Up: At [facility], you and your health needs are our priority.  As part of our continuing mission to provide you with exceptional heart care, we have created designated Provider Care Teams.  These Care Teams include your primary Cardiologist (physician) and Advanced Practice Providers (APPs -  Physician Assistants and Nurse Practitioners) who all work together to provide you with the care you need, when you need it.  We recommend signing up for the patient portal called "MyChart".  Sign up information is provided on this After Visit Summary.  MyChart is used to connect with patients for Virtual Visits (Telemedicine).  Patients are able to view lab/test results, encounter notes, upcoming appointments, etc.  Non-urgent messages can be sent to your provider as well.   To learn more about what you can do with MyChart, go to [email or web address].    Your next appointment:   6 week(s)  The format for your next appointment:   In Person  Provider:   You may see [name], MD or one of the following Advanced Practice Providers on your designated Care Team:   Murray Hodgkins, NP Christell Faith, PA-C Marrianne Mood, PA-C Cadence Woodlawn, [place]

## 2020-12-05 NOTE — Progress Notes (Signed)
Follow-up Outpatient Visit Date: 12/05/2020  Primary Care Provider: [name], MD [place] [address]  Chief Complaint: Chest pain  HPI:  Cheryl Velez is a 80 y.o. female with history of apical hypertrophic cardiomyopathy, hypertension, hyperlipidemia, obstructive sleep apnea, hypothyroidism, osteoarthritis, and PTSD, who presents for follow-up of chest pain in the setting of HCM.  I last saw her in 01/2020, at which time she was doing relatively well after having recovered from COVID-19 last summer.  She was seen in the ED last month with chest tightness.  Work-up was largely unrevealing though high-sensitivity troponin I was borderline elevated (HS-TnI 17->22).  D-dimer was negative.  EKG showed stable ST/T findings consistent with apical HCM.  She was discharged on isosorbide mononitrate at the recommendation of Dr. Garen Lah.  Today, Cheryl Velez reports that she has been feeling better.  She still notes occasional vague chest discomfort but nothing like the sharp chest pain and chest pressure that led to her ED visit last month.  In hindsight, she wonders if having stopped famotidine prior to the onset of chest pain may have contributed.  She is now back on famotidine as well as isosorbide mononitrate, tolerating both well.  She has had occasional brief palpitations that were not associated with the aforementioned chest pain.  At times, she feels a little bit "swimmy headed," especially when standing up quickly.  She has not had any shortness of breath or edema.  Blood pressure has been well controlled.  --------------------------------------------------------------------------------------------------  Cardiovascular History & Procedures: Cardiovascular Problems: Atypical chest pain Apical hypertrophic cardiomyopathy   Risk Factors: Hypertension, hyperlipidemia, obesity, and age greater than 33   Cath/PCI: LHC (06/23/17): Minimal luminal irregularities  involving the LMCA, LAD, and RCA. Hyperdynamic LVEF with suggestion of apical hypertrophy. LVEDP 24-26 mmHg.   CV Surgery: None   EP Procedures and Devices: 48-hour Holter monitor (07/30/2017): Predominantly sinus rhythm with rare PACs and PVCs.  No significant arrhythmias.  Average heart rate 64 bpm.   Non-Invasive Evaluation(s): TTE (07/06/17): Normal LV size with moderate LVH. LVEF 55-60% with grade 2 diastolic dysfunction. Trivial aortic regurgitation. Mild left atrial enlargement. Normal RV size and function. Upper normal PA pressure. Exercise MPI (01/08/13): No ischemia or scar. LVEF 61%.  Recent CV Pertinent Labs: Lab Results  Component Value Date   CHOL 171 01/08/2013   HDL 38 (L) 01/08/2013   LDLCALC 90 01/08/2013   TRIG 215 (H) 01/08/2013   CHOLHDL 4.5 01/08/2013   INR 1.0 06/19/2017   K 4.3 11/14/2020   BUN 17 11/14/2020   BUN 17 06/19/2017   CREATININE 0.65 11/14/2020    Past medical and surgical history were reviewed and updated in EPIC.  Current Meds  Medication Sig   albuterol (VENTOLIN HFA) 108 (90 Base) MCG/ACT inhaler Inhale 1-2 puffs into the lungs every 6 (six) hours as needed.   b complex vitamins tablet Take 1 tablet by mouth at bedtime. SUPER B COMPLEX   Calcium Carb-Cholecalciferol (CALCIUM 600+D3 PO) Take 1 tablet by mouth at bedtime.   Cholecalciferol (VITAMIN D3) 2000 units TABS Take 2,000 Units by mouth at bedtime.   cholestyramine light (PREVALITE) 4 g packet Take 4 g by mouth 2 (two) times daily.   fenofibrate 160 MG tablet Take 160 mg by mouth at bedtime.    hyoscyamine ([name]) 0.125 MG tablet Take 0.125 mg by mouth every 4 (four) hours as needed for cramping.   isosorbide mononitrate (IMDUR) 30 MG 24 hr tablet Take  1 tablet (30 mg total) by mouth daily.   levothyroxine (SYNTHROID, LEVOTHROID) 75 MCG tablet Take 75 mcg by mouth daily at 2 am.    losartan (COZAAR) 100 MG tablet Take 100 mg by mouth at bedtime.    metoprolol tartrate (LOPRESSOR) 25  MG tablet Take 0.5 tablets (12.5 mg total) by mouth 2 (two) times daily.   Multiple Vitamin (MULTIVITAMIN WITH MINERALS) TABS tablet Take 1 tablet by mouth at bedtime.    naproxen sodium (ANAPROX) 550 MG tablet Take 550 mg by mouth 2 (two) times daily as needed (FOR PAIN.).   pantoprazole (PROTONIX) 40 MG tablet Take 40 mg by mouth daily.    Allergies: Lipitor [atorvastatin], Paxil [paroxetine hcl], Shellfish allergy, Latex, Nickel, and Other  Social History   Tobacco Use   Smoking status: Never   Smokeless tobacco: Never  Vaping Use   Vaping Use: Never used  Substance Use Topics   Alcohol use: Yes    Comment: occassional glass of wine   Drug use: No    Family History  Problem Relation Age of Onset   Stroke Mother    Hypertension Mother    Colon polyps Mother    Hypertension Father    CAD Father    Cirrhosis Father    Suicidality Sister    Hypertension Sister    Obesity Sister    Diverticulitis Sister    Hypertension Sister    Gout Sister    Alcohol abuse Sister    Polycythemia Sister    Hypertension Brother 92   Heart attack Brother        Sudden cardiac death   Heart Problems Brother 57   Diabetes Brother 91   Hypertension Brother    Heart Problems Brother    Other Brother        MURDERED   Hypertension Brother    CVA Brother    Blindness Brother    CAD Maternal Grandmother    CAD Maternal Grandfather    Breast cancer Paternal Grandmother    CAD Paternal Grandmother    CAD Paternal Grandfather     Review of Systems: A 12-system review of systems was performed and was negative except as noted in the HPI.  --------------------------------------------------------------------------------------------------  Physical Exam: BP 116/62 (BP Location: Left Arm, Patient Position: Sitting, Cuff Size: Large)   Pulse 62   Ht '5\' 4"'$  (1.626 m)   Wt 202 lb (91.6 kg)   SpO2 98%   BMI 34.67 kg/m   General:  NAD. Neck: No JVD or HJR. Lungs: Clear to auscultation  bilaterally without wheezes or crackles. Heart: Regular rate and rhythm without murmurs, rubs, or gallops. Abdomen: Soft, nontender, nondistended. Extremities: No lower extremity edema.  EKG: Normal sinus rhythm with LVH and anterolateral and inferior T wave inversions consistent with history of hypertrophic cardiomyopathy.  No significant change since 02/22/2020.  Lab Results  Component Value Date   WBC 7.2 11/14/2020   HGB 13.1 11/14/2020   HCT 40.3 11/14/2020   MCV 91.6 11/14/2020   PLT 151 11/14/2020    Lab Results  Component Value Date   NA 135 11/14/2020   K 4.3 11/14/2020   CL 104 11/14/2020   CO2 20 (L) 11/14/2020   BUN 17 11/14/2020   CREATININE 0.65 11/14/2020   GLUCOSE 123 (H) 11/14/2020   ALT 15 11/14/2020    Lab Results  Component Value Date   CHOL 171 01/08/2013   HDL 38 (L) 01/08/2013   LDLCALC 90 01/08/2013  TRIG 215 (H) 01/08/2013   CHOLHDL 4.5 01/08/2013    --------------------------------------------------------------------------------------------------  ASSESSMENT AND PLAN: Atypical chest pain: Patient reports 1 episode of severe chest pain that woke her from sleep last month and led to ED visit.  Work-up there was notable for minimal, flat troponin elevation.  Symptoms began in the setting of having discontinued famotidine.  She is back on famotidine as well as isosorbide mononitrate that was added during her ED visit.  She is tolerating both without recurrent of severe pain.  She still has some mild vague chest discomfort that is nonspecific.  We reviewed her catheterization from 2018 which showed mild luminal irregularities but no significant CAD.  We discussed repeat ischemia evaluation, particularly coronary CTA, but have agreed to defer this.  She has worsening chest pain again, we will proceed with CTA.  Continue with current regimen of metoprolol and isosorbide mononitrate, as well as famotidine.  Apical hypertrophic cardiomyopathy: No signs or  symptoms of heart failure.  Continue rate control with low-dose metoprolol.  Hypertension: Blood pressure well controlled today.  Given orthostatic lightheadedness at times and recent addition of isosorbide mononitrate, we have agreed to decrease losartan to 50 mg daily.  Hypertriglyceridemia: Ongoing management per PCP.  Follow-up: Return to clinic in 6 weeks.  [name], MD 12/05/2020 9:48 AM

## 2021-01-17 NOTE — Progress Notes (Signed)
Cardiology Office Note    Date:  01/18/2021   ID:  Cheryl, Velez 28-Sep-1940, MRN [id number]  PCP:  [name], MD  Cardiologist:  [name], MD  Electrophysiologist:  None   Chief Complaint: Follow-up  History of Present Illness:   Cheryl Velez is a 80 y.o. female with history of apical hypertrophic cardiomyopathy, HTN, HLD, COVID in the summer 2021, obstructive sleep apnea, hypothyroidism, osteoarthritis, PTSD, and GERD who presents for follow-up of chest pain.  Nuclear stress test in 12/2012 showed no evidence of reversible/inducible ischemia with an EF of 61%.  LHC in 05/2017 showed widely patent coronary arteries with minor nonobstructive CAD.  There was vigorous LV systolic function with an EF greater than 65% and apical hypertrophy noted.  Medical management was advised.  Echo in 06/2017 showed an EF of 55 to 60%, moderate concentric LVH, no regional wall motion abnormalities, grade 2 diastolic dysfunction, trivial aortic insufficiency, trivial mitral regurgitation, mildly dilated left atrium, normal RV systolic function and ventricular cavity size, and an estimated PASP of 31 mmHg.  Holter monitor in 07/2017 showed a predominant rhythm of sinus with an average rate of 64 (range 49 to 106 bpm), longest R-R interval 1.4 seconds, rare PACs and PVCs, and no sustained arrhythmias or prolonged pauses.  More recently, she was seen in the ED on 11/14/2020 with chest tightness.  Work-up was largely unrevealing, though high-sensitivity troponin was borderline elevated with an initial value of 17 and a delta of 22.  D-dimer was negative.  EKG demonstrated stable ST-T findings consistent with apical hypertrophic cardiomyopathy.  She was discharged on Imdur at request of Dr. [name].  She was last seen in the office on 12/05/2020 and indicated she had been feeling better.  She did continue to note occasional vague chest discomfort, though was improved from the sharp chest pain and chest pressure  that led to her ED visit in 10/2020.  In hindsight, she wondered if having stopped famotidine prior to the onset of chest pain contributed to her symptoms.  She also noted occasional brief palpitations that were not associated with her above chest pain.  Given resolution of symptoms, she preferred to defer further ischemic evaluation at that time with recommendation to continue medical management.  She comes in feeling well today.  No further chest discomfort, including vague mild discomfort that she was experiencing at her last visit.  No dyspnea, palpitations, dizziness, presyncope, or syncope.  She does note some underlying fatigue and tries to stay active.  No lower extremity swelling, abdominal distention, or orthopnea.  She is no longer taking famotidine, though with this has not had any chest discomfort.   Labs independently reviewed: 10/2020 - albumin 3.9, AST/ALT normal, Hgb 13.1, PLT 151, potassium 4.3, BUN 17, serum creatinine 0.65 12/2012 - TC 171, TG 215, HDL 38, LDL 90  Past Medical History:  Diagnosis Date   Adenomatous colon polyp    Arthritis    Chest pain 01/06/2013   Chest pain    GI (gastrointestinal bleed)    [name], ADENOMATOUS POLYP 6/14, 3 YEAR FOLLOW UP, 9/17, 3 YEAR FOLLOW UP.   Hearing loss    BIATERAL HEARING AIDS, [name]   Hypercholesterolemia    Hyperlipidemia    Hypertension    DX 8/16   Hypothyroidism    OSA (obstructive sleep apnea)    (PSG 03/21/12 ESS 6, AHI 14/hr REM 44/hr, RDI SAME, O2 min 77% APAP STARTED 12/15), [name]  Osteoarthritis    KNEES, HANDS HAS SEEN [name]   PTSD (post-traumatic stress disorder)    Thyroid disease     Past Surgical History:  Procedure Laterality Date   CARDIAC CATHETERIZATION     KNEE SURGERY     LEFT HEART CATH AND CORONARY ANGIOGRAPHY N/A 06/23/2017   Procedure: LEFT HEART CATH AND CORONARY ANGIOGRAPHY;  Surgeon: [name], MD;  Location: [facility];  Service: Cardiovascular;   Laterality: N/A;    Current Medications: Current Meds  Medication Sig   albuterol (VENTOLIN HFA) 108 (90 Base) MCG/ACT inhaler Inhale 1-2 puffs into the lungs every 6 (six) hours as needed.   b complex vitamins tablet Take 1 tablet by mouth at bedtime. SUPER B COMPLEX   Calcium Carb-Cholecalciferol (CALCIUM 600+D3 PO) Take 1 tablet by mouth at bedtime.   Cholecalciferol (VITAMIN D3) 2000 units TABS Take 2,000 Units by mouth at bedtime.   cholestyramine light (PREVALITE) 4 g packet Take 4 g by mouth 2 (two) times daily.   fenofibrate 160 MG tablet Take 160 mg by mouth at bedtime.    hyoscyamine ([name]) 0.125 MG tablet Take 0.125 mg by mouth every 4 (four) hours as needed for cramping.   isosorbide mononitrate (IMDUR) 30 MG 24 hr tablet Take 1 tablet (30 mg total) by mouth daily.   levothyroxine (SYNTHROID, LEVOTHROID) 75 MCG tablet Take 75 mcg by mouth daily at 2 am.    losartan (COZAAR) 50 MG tablet Take 1 tablet (50 mg total) by mouth at bedtime.   metoprolol tartrate (LOPRESSOR) 25 MG tablet Take 0.5 tablets (12.5 mg total) by mouth 2 (two) times daily.   Multiple Vitamin (MULTIVITAMIN WITH MINERALS) TABS tablet Take 1 tablet by mouth at bedtime.    naproxen sodium (ANAPROX) 550 MG tablet Take 550 mg by mouth 2 (two) times daily as needed (FOR PAIN.).    Allergies:   Lipitor [atorvastatin], Paxil [paroxetine hcl], Shellfish allergy, Latex, Nickel, and Other   Social History   Socioeconomic History   Marital status: Married    Spouse name: Journalist, newspaper   Number of children: 3   Years of education: Not on file   Highest education level: Not on file  Occupational History   Occupation: RETIRED  Tobacco Use   Smoking status: Never   Smokeless tobacco: Never  Vaping Use   Vaping Use: Never used  Substance and Sexual Activity   Alcohol use: Yes    Comment: occassional glass of wine   Drug use: No   Sexual activity: Not on file  Other Topics Concern   Not on file  Social History  Narrative   Not on file   Social Determinants of Health   Financial Resource Strain: Not on file  Food Insecurity: Not on file  Transportation Needs: Not on file  Physical Activity: Not on file  Stress: Not on file  Social Connections: Not on file     Family History:  The patient's family history includes Alcohol abuse in her sister; Blindness in her brother; Breast cancer in her paternal grandmother; CAD in her father, maternal grandfather, maternal grandmother, paternal grandfather, and paternal grandmother; CVA in her brother; Cirrhosis in her father; Colon polyps in her mother; Diabetes (age of onset: 82) in her brother; Diverticulitis in her sister; Gout in her sister; Heart Problems in her brother; Heart Problems (age of onset: 21) in her brother; Heart attack in her brother; Hypertension in her brother, brother, father, mother, sister, and sister; Hypertension (age  of onset: 16) in her brother; Obesity in her sister; Other in her brother; Polycythemia in her sister; Stroke in her mother; Suicidality in her sister.  ROS:   Review of Systems  Constitutional:  Positive for malaise/fatigue. Negative for chills, diaphoresis, fever and weight loss.  HENT:  Negative for congestion.   Eyes:  Negative for discharge and redness.  Respiratory:  Negative for cough, sputum production, shortness of breath and wheezing.   Cardiovascular:  Negative for chest pain, palpitations, orthopnea, claudication, leg swelling and PND.  Gastrointestinal:  Negative for abdominal pain, heartburn, nausea and vomiting.  Musculoskeletal:  Negative for falls and myalgias.  Skin:  Negative for rash.  Neurological:  Negative for dizziness, tingling, tremors, sensory change, speech change, focal weakness, loss of consciousness and weakness.  Endo/Heme/Allergies:  Does not bruise/bleed easily.  Psychiatric/Behavioral:  Negative for substance abuse. The patient is not nervous/anxious.   All other systems reviewed and  are negative.   EKGs/Labs/Other Studies Reviewed:    Studies reviewed were summarized above. The additional studies were reviewed today:  48-hour Holter 07/2017: The patient was monitored for 48 hours. The predominant rhythm was sinus with an average rate of 64 bpm (range 49-106 bpm). The longest R-R interval was 1.4 seconds. Rare PAC's and PVC's were observed. No sustained arrhythmia or prolonged pause was seen. Patient diary events correspond to sinus rhythm.   Predominantly sinus rhythm with rare PAC's and PVC's.  No significant arrhythmias. __________  2D echo 07/06/2017: - Left ventricle: The cavity size was normal. There was moderate    concentric hypertrophy. Systolic function was normal. The    estimated ejection fraction was in the range of 55% to 60%. Wall    motion was normal; there were no regional wall motion    abnormalities. Features are consistent with a pseudonormal left    ventricular filling pattern, with concomitant abnormal relaxation    and increased filling pressure (grade 2 diastolic dysfunction).    Doppler parameters are consistent with high ventricular filling    pressure.  - Aortic valve: Transvalvular velocity was within the normal range.    There was no stenosis. There was trivial regurgitation.  - Mitral valve: Transvalvular velocity was within the normal range.    There was no evidence for stenosis. There was trivial    regurgitation.  - Left atrium: The atrium was mildly dilated.  - Right ventricle: The cavity size was normal. Wall thickness was    normal. Systolic function was normal.  - Atrial septum: No defect or patent foramen ovale was identified    by color flow Doppler.  - Tricuspid valve: There was mild regurgitation.  - Pulmonary arteries: Systolic pressure was within the normal    range. PA peak pressure: 31 mm Hg (S). __________  LHC 06/23/2017: 1. Widely patent coronary arteries with minor nonobstructive CAD  2. Vigorous LV  function with LVEF >65% and apical hypertrophy noted 3. Moderately elevated LVEDP   Recommend: medical management __________  Nuclear stress test 01/08/2013: 1.  No evidence of reversible/inducible ischemia.  2.  Normal ventricular wall motion.  3.  Calculated ejection fraction 61%.     EKG:  EKG is not ordered today given resolution of symptoms and stable tracing 1 month ago.    Recent Labs: 11/14/2020: ALT 15; BUN 17; Creatinine, Ser 0.65; Hemoglobin 13.1; Platelets 151; Potassium 4.3; Sodium 135  Recent Lipid Panel    Component Value Date/Time   CHOL 171 01/08/2013 0017   TRIG 215 (  H) 01/08/2013 0017   HDL 38 (L) 01/08/2013 0017   CHOLHDL 4.5 01/08/2013 0017   VLDL 43 (H) 01/08/2013 0017   LDLCALC 90 01/08/2013 0017    PHYSICAL EXAM:    VS:  BP 120/66 (BP Location: Left Arm, Patient Position: Sitting, Cuff Size: Normal)   Pulse 66   Ht 5\' 4"  (1.626 m)   Wt 197 lb (89.4 kg)   SpO2 98%   BMI 33.81 kg/m   BMI: Body mass index is 33.81 kg/m.  Physical Exam Vitals reviewed.  Constitutional:      Appearance: She is well-developed.  HENT:     Head: Normocephalic and atraumatic.  Eyes:     General:        Right eye: No discharge.        Left eye: No discharge.  Neck:     Vascular: No JVD.  Cardiovascular:     Rate and Rhythm: Normal rate and regular rhythm.     Heart sounds: Normal heart sounds, S1 normal and S2 normal. Heart sounds not distant. No midsystolic click and no opening snap. No murmur heard.   No friction rub.  Pulmonary:     Effort: Pulmonary effort is normal. No respiratory distress.     Breath sounds: Normal breath sounds. No decreased breath sounds, wheezing or rales.  Chest:     Chest wall: No tenderness.  Abdominal:     General: There is no distension.     Palpations: Abdomen is soft.     Tenderness: There is no abdominal tenderness.  Musculoskeletal:     Cervical back: Normal range of motion.     Right lower leg: No edema.     Left lower  leg: No edema.  Skin:    General: Skin is warm and dry.     Nails: There is no clubbing.  Neurological:     Mental Status: She is alert and oriented to person, place, and time.  Psychiatric:        Speech: Speech normal.        Behavior: Behavior normal.        Thought Content: Thought content normal.        Judgment: Judgment normal.    Wt Readings from Last 3 Encounters:  01/18/21 197 lb (89.4 kg)  12/05/20 202 lb (91.6 kg)  11/14/20 198 lb (89.8 kg)     ASSESSMENT & PLAN:   Atypical chest pain: No further symptoms.  Diagnostic cath in 2019 showed no evidence of obstructive disease with minor luminal irregularities.  Continue Imdur and Lopressor.  Given resolution of symptoms, no indication for further ischemic testing at this time.  Should symptoms redevelop, would recommend coronary CTA.  Apical hypertrophic cardiomyopathy: No symptoms of CHF, dizziness, presyncope, or syncope.  Continue rate control with Lopressor.  No further palpitations.  HTN: Blood pressure is well controlled in the office today.  Positional dizziness has resolved.  She remains on Lopressor, losartan, and Imdur.  Hypertriglyceridemia: She remains on fenofibrate.  Ongoing management per PCP.  Reflux: Symptoms are well controlled.  She will resume famotidine.  Disposition: F/u with Dr. Saunders Revel or an APP in 6 months, sooner if needed.   Medication Adjustments/Labs and Tests Ordered: Current medicines are reviewed at length with the patient today.  Concerns regarding medicines are outlined above. Medication changes, Labs and Tests ordered today are summarized above and listed in the Patient Instructions accessible in Encounters.   Signed, Christell Faith, PA-C 01/18/2021 2:31 PM  [address], [place] [phone]

## 2021-01-18 ENCOUNTER — Encounter: Payer: Self-pay | Admitting: Physician Assistant

## 2021-01-18 ENCOUNTER — Other Ambulatory Visit: Payer: Self-pay

## 2021-01-18 ENCOUNTER — Ambulatory Visit (INDEPENDENT_AMBULATORY_CARE_PROVIDER_SITE_OTHER): Payer: Medicare Other | Admitting: Physician Assistant

## 2021-01-18 VITALS — BP 120/66 | HR 66 | Ht 64.0 in | Wt 197.0 lb

## 2021-01-18 DIAGNOSIS — I10 Essential (primary) hypertension: Secondary | ICD-10-CM

## 2021-01-18 DIAGNOSIS — I422 Other hypertrophic cardiomyopathy: Secondary | ICD-10-CM | POA: Diagnosis not present

## 2021-01-18 DIAGNOSIS — E781 Pure hyperglyceridemia: Secondary | ICD-10-CM | POA: Diagnosis not present

## 2021-01-18 DIAGNOSIS — R0789 Other chest pain: Secondary | ICD-10-CM

## 2021-01-18 DIAGNOSIS — K219 Gastro-esophageal reflux disease without esophagitis: Secondary | ICD-10-CM | POA: Diagnosis not present

## 2021-01-18 NOTE — Patient Instructions (Addendum)
    Ventolin - lung Pepcid - reflux  Fenofibrate - cholesterol  [name] - GI Imdur - heart Levothyroxine - thyroid Losartan - heart/BP Metoprolol - heart/BP   Medication Instructions:  Your physician has recommended you make the following change in your medication:   RESTART Famotidine OVER THE COUNTER    *If you need a refill on your cardiac medications before your next appointment, please call your pharmacy*   Lab Work: None ordered  If you have labs (blood work) drawn today and your tests are completely normal, you will receive your results only by: [place] (if you have MyChart) OR A paper copy in the mail If you have any lab test that is abnormal or we need to change your treatment, we will call you to review the results.   Testing/Procedures: None ordered   Follow-Up: At [facility], you and your health needs are our priority.  As part of our continuing mission to provide you with exceptional heart care, we have created designated Provider Care Teams.  These Care Teams include your primary Cardiologist (physician) and Advanced Practice Providers (APPs -  Physician Assistants and Nurse Practitioners) who all work together to provide you with the care you need, when you need it.  We recommend signing up for the patient portal called "MyChart".  Sign up information is provided on this After Visit Summary.  MyChart is used to connect with patients for Virtual Visits (Telemedicine).  Patients are able to view lab/test results, encounter notes, upcoming appointments, etc.  Non-urgent messages can be sent to your provider as well.   To learn more about what you can do with MyChart, go to [email or web address].    Your next appointment:   6 month(s)  The format for your next appointment:   In Person  Provider:   You may see [name], MD or one of the following Advanced Practice Providers on your designated Care Team:   Murray Hodgkins, NP Christell Faith,  PA-C Marrianne Mood, PA-C Cadence Kathlen Mody, [place]   Other Instructions

## 2021-03-13 DIAGNOSIS — B349 Viral infection, unspecified: Secondary | ICD-10-CM | POA: Diagnosis not present

## 2021-03-14 DIAGNOSIS — B349 Viral infection, unspecified: Secondary | ICD-10-CM | POA: Diagnosis not present

## 2021-03-14 DIAGNOSIS — R051 Acute cough: Secondary | ICD-10-CM | POA: Diagnosis not present

## 2021-07-15 NOTE — Progress Notes (Signed)
? ?Cardiology Office Note   ? ?Date:  07/18/2021  ? ?IDNOVALEIGH Velez, DOB Dec 28, 1940, MRN [id number] ? ?PCP:  [name], MD  ?Cardiologist:  [name], MD  ?Electrophysiologist:  None  ? ?Chief Complaint: Follow-up ? ?History of Present Illness:  ? ?Cheryl Velez is a 81 y.o. female with history of apical hypertrophic cardiomyopathy, HTN, HLD, COVID in the summer 2021, obstructive sleep apnea, hypothyroidism, osteoarthritis, PTSD, and GERD who presents for follow-up of her cardiomyopathy. ?  ?Nuclear stress test in 12/2012 showed no evidence of reversible/inducible ischemia with an EF of 61%.  LHC in 05/2017 showed widely patent coronary arteries with minor nonobstructive CAD.  There was vigorous LV systolic function with an EF greater than 65% and apical hypertrophy noted.  Medical management was advised.  Echo in 06/2017 showed an EF of 55 to 60%, moderate concentric LVH, no regional wall motion abnormalities, grade 2 diastolic dysfunction, trivial aortic insufficiency, trivial mitral regurgitation, mildly dilated left atrium, normal RV systolic function and ventricular cavity size, and an estimated PASP of 31 mmHg.  Holter monitor in 07/2017 showed a predominant rhythm of sinus with an average rate of 64 (range 49 to 106 bpm), rare PACs and PVCs, and no sustained arrhythmias or prolonged pauses. ?  ?She was seen in the ED on 11/14/2020 with chest tightness.  Work-up was largely unrevealing, though high-sensitivity troponin was borderline elevated with an initial value of 17 and a delta of 22.  D-dimer was negative.  EKG demonstrated stable ST-T findings consistent with apical hypertrophic cardiomyopathy.  She was discharged on Imdur at request of Dr. [name].  She was seen in the office on 12/05/2020 and indicated she had been feeling better.  She did continue to note occasional vague chest discomfort, though was improved from the sharp chest pain and chest pressure that led to her ED visit in 10/2020.  In  hindsight, she wondered if having stopped famotidine prior to the onset of chest pain contributed to her symptoms.  She also noted occasional brief palpitations that were not associated with her above chest pain.  Given resolution of symptoms, she preferred to defer further ischemic evaluation at that time with recommendation to continue medical management. ? ?She was last seen in the office on 01/18/2021 and had been without further chest pain, including the mild discomfort that she was feeling at her prior visit.  She was without symptoms of decompensation. ? ?She comes in doing reasonably well from a cardiac perspective.  No symptoms of progressive angina or decompensation.  She does continue to note an intermittent mild discomfort described as "rumbling."  This is unchanged from her baseline.  She also had 1 episode of dizziness without presyncope or syncope.  No symptoms of angina or palpitations with this episode.  No lower extremity swelling, abdominal distention, or orthopnea.  She remains off of famotidine.  Overall, she feels like she is doing well and does not have any active cardiac issues or concerns at this time. ? ? ?Labs independently reviewed: ?10/2020 - albumin 3.9, AST/ALT normal, Hgb 13.1, PLT 151, potassium 4.3, BUN 17, serum creatinine 0.65 ?12/2012 - TC 171, TG 215, HDL 38, LDL 90 ? ?Past Medical History:  ?Diagnosis Date  ? Adenomatous colon polyp   ? Arthritis   ? Chest pain 01/06/2013  ? Chest pain   ? GI (gastrointestinal bleed)   ? [name], ADENOMATOUS POLYP 6/14, 3 YEAR FOLLOW UP, 9/17, 3 YEAR FOLLOW UP.  ?  Hearing loss   ? [place]  ? Hypercholesterolemia   ? Hyperlipidemia   ? Hypertension   ? DX 8/16  ? Hypothyroidism   ? OSA (obstructive sleep apnea)   ? (PSG 03/21/12 ESS 6, AHI 14/hr REM 44/hr, RDI SAME, O2 min 77% APAP STARTED 12/15), [name]  ? Osteoarthritis   ? KNEES, HANDS HAS SEEN [name]  ? PTSD (post-traumatic stress disorder)   ? Thyroid  disease   ? ? ?Past Surgical History:  ?Procedure Laterality Date  ? CARDIAC CATHETERIZATION    ? KNEE SURGERY    ? LEFT HEART CATH AND CORONARY ANGIOGRAPHY N/A 06/23/2017  ? Procedure: LEFT HEART CATH AND CORONARY ANGIOGRAPHY;  Surgeon: [name], MD;  Location: [name];  Service: Cardiovascular;  Laterality: N/A;  ? ? ?Current Medications: ?Current Meds  ?Medication Sig  ? b complex vitamins tablet Take 1 tablet by mouth at bedtime. SUPER B COMPLEX  ? Calcium Carb-Cholecalciferol (CALCIUM 600+D3 PO) Take 1 tablet by mouth at bedtime.  ? Cholecalciferol (VITAMIN D3) 2000 units TABS Take 2,000 Units by mouth at bedtime.  ? cholestyramine light (PREVALITE) 4 g packet Take 4 g by mouth 2 (two) times daily.  ? fenofibrate 160 MG tablet Take 160 mg by mouth at bedtime.   ? hyoscyamine ([name]) 0.125 MG tablet Take 0.125 mg by mouth every 4 (four) hours as needed for cramping.  ? isosorbide mononitrate (IMDUR) 30 MG 24 hr tablet Take 1 tablet (30 mg total) by mouth daily.  ? levothyroxine (SYNTHROID, LEVOTHROID) 75 MCG tablet Take 75 mcg by mouth daily at 2 am.   ? losartan (COZAAR) 50 MG tablet Take 1 tablet (50 mg total) by mouth at bedtime.  ? metoprolol tartrate (LOPRESSOR) 25 MG tablet Take 0.5 tablets (12.5 mg total) by mouth 2 (two) times daily.  ? Multiple Vitamin (MULTIVITAMIN WITH MINERALS) TABS tablet Take 1 tablet by mouth at bedtime.   ? naproxen sodium (ANAPROX) 550 MG tablet Take 550 mg by mouth 2 (two) times daily as needed (FOR PAIN.).  ? ? ?Allergies:   Lipitor [atorvastatin], Paxil [paroxetine hcl], Shellfish allergy, Latex, Nickel, and Other  ? ?Social History  ? ?Socioeconomic History  ? Marital status: Married  ?  Spouse name: [name]  ? Number of children: 3  ? Years of education: Not on file  ? Highest education level: Not on file  ?Occupational History  ? Occupation: RETIRED  ?Tobacco Use  ? Smoking status: Never  ? Smokeless tobacco: Never  ?Vaping Use  ? Vaping Use: Never used   ?Substance and Sexual Activity  ? Alcohol use: Yes  ?  Comment: occassional glass of wine  ? Drug use: No  ? Sexual activity: Not on file  ?Other Topics Concern  ? Not on file  ?Social History Narrative  ? Not on file  ? ?Social Determinants of Health  ? ?Financial Resource Strain: Not on file  ?Food Insecurity: Not on file  ?Transportation Needs: Not on file  ?Physical Activity: Not on file  ?Stress: Not on file  ?Social Connections: Not on file  ?  ? ?Family History:  ?The patient's family history includes Alcohol abuse in her sister; Blindness in her brother; Breast cancer in her paternal grandmother; CAD in her father, maternal grandfather, maternal grandmother, paternal grandfather, and paternal grandmother; CVA in her brother; Cirrhosis in her father; Colon polyps in her mother; Diabetes (age of onset: 45) in her brother; Diverticulitis in  her sister; Gout in her sister; Heart Problems in her brother; Heart Problems (age of onset: 12) in her brother; Heart attack in her brother; Hypertension in her brother, brother, father, mother, sister, and sister; Hypertension (age of onset: 72) in her brother; Obesity in her sister; Other in her brother; Polycythemia in her sister; Stroke in her mother; Suicidality in her sister. ? ?ROS:   ?12-point review of systems is negative as otherwise noted in HPI ? ? ?EKGs/Labs/Other Studies Reviewed:   ? ?Studies reviewed were summarized above. The additional studies were reviewed today: ? ?48-hour Holter 07/2017: ?The patient was monitored for 48 hours. ?The predominant rhythm was sinus with an average rate of 64 bpm (range 49-106 bpm). The longest R-R interval was 1.4 seconds. ?Rare PAC's and PVC's were observed. ?No sustained arrhythmia or prolonged pause was seen. ?Patient diary events correspond to sinus rhythm. ?  ?Predominantly sinus rhythm with rare PAC's and PVC's.  No significant arrhythmias. ?__________ ?  ?2D echo 07/06/2017: ?- Left ventricle: The cavity size was  normal. There was moderate  ?  concentric hypertrophy. Systolic function was normal. The  ?  estimated ejection fraction was in the range of 55% to 60%. Wall  ?  motion was normal; there were no regional wall motion

## 2021-07-18 ENCOUNTER — Ambulatory Visit (INDEPENDENT_AMBULATORY_CARE_PROVIDER_SITE_OTHER): Payer: Medicare Other | Admitting: Physician Assistant

## 2021-07-18 ENCOUNTER — Encounter: Payer: Self-pay | Admitting: Physician Assistant

## 2021-07-18 ENCOUNTER — Other Ambulatory Visit: Payer: Self-pay

## 2021-07-18 VITALS — BP 114/60 | HR 62 | Ht 64.0 in | Wt 200.0 lb

## 2021-07-18 DIAGNOSIS — I10 Essential (primary) hypertension: Secondary | ICD-10-CM | POA: Diagnosis not present

## 2021-07-18 DIAGNOSIS — E781 Pure hyperglyceridemia: Secondary | ICD-10-CM | POA: Diagnosis not present

## 2021-07-18 DIAGNOSIS — R0789 Other chest pain: Secondary | ICD-10-CM

## 2021-07-18 DIAGNOSIS — I422 Other hypertrophic cardiomyopathy: Secondary | ICD-10-CM

## 2021-07-18 NOTE — Patient Instructions (Signed)
Medication Instructions:  ?No changes at this time.  ? ?*If you need a refill on your cardiac medications before your next appointment, please call your pharmacy* ? ? ?Lab Work: ?None ? ?If you have labs (blood work) drawn today and your tests are completely normal, you will receive your results only by: ?MyChart Message (if you have MyChart) OR ?A paper copy in the mail ?If you have any lab test that is abnormal or we need to change your treatment, we will call you to review the results. ? ? ?Testing/Procedures: ?None ? ? ?Follow-Up: ?At [facility], you and your health needs are our priority.  As part of our continuing mission to provide you with exceptional heart care, we have created designated Provider Care Teams.  These Care Teams include your primary Cardiologist (physician) and Advanced Practice Providers (APPs -  Physician Assistants and Nurse Practitioners) who all work together to provide you with the care you need, when you need it. ? ? ?Your next appointment:   ?6 month(s) ? ?The format for your next appointment:   ?In Person ? ?Provider:   ?Christopher End, MD or Ryan Dunn, PA-C ?

## 2021-08-14 DIAGNOSIS — I422 Other hypertrophic cardiomyopathy: Secondary | ICD-10-CM | POA: Diagnosis not present

## 2021-08-14 DIAGNOSIS — M8588 Other specified disorders of bone density and structure, other site: Secondary | ICD-10-CM | POA: Diagnosis not present

## 2021-08-14 DIAGNOSIS — E785 Hyperlipidemia, unspecified: Secondary | ICD-10-CM | POA: Diagnosis not present

## 2021-08-14 DIAGNOSIS — E039 Hypothyroidism, unspecified: Secondary | ICD-10-CM | POA: Diagnosis not present

## 2021-08-14 DIAGNOSIS — Z79899 Other long term (current) drug therapy: Secondary | ICD-10-CM | POA: Diagnosis not present

## 2021-08-14 DIAGNOSIS — R7303 Prediabetes: Secondary | ICD-10-CM | POA: Diagnosis not present

## 2021-08-14 DIAGNOSIS — Z0000 Encounter for general adult medical examination without abnormal findings: Secondary | ICD-10-CM | POA: Diagnosis not present

## 2021-08-14 DIAGNOSIS — G4733 Obstructive sleep apnea (adult) (pediatric): Secondary | ICD-10-CM | POA: Diagnosis not present

## 2021-08-14 DIAGNOSIS — K219 Gastro-esophageal reflux disease without esophagitis: Secondary | ICD-10-CM | POA: Diagnosis not present

## 2021-08-14 DIAGNOSIS — M5136 Other intervertebral disc degeneration, lumbar region: Secondary | ICD-10-CM | POA: Diagnosis not present

## 2021-08-14 DIAGNOSIS — I10 Essential (primary) hypertension: Secondary | ICD-10-CM | POA: Diagnosis not present

## 2021-08-14 DIAGNOSIS — R197 Diarrhea, unspecified: Secondary | ICD-10-CM | POA: Diagnosis not present

## 2021-08-14 DIAGNOSIS — M1711 Unilateral primary osteoarthritis, right knee: Secondary | ICD-10-CM | POA: Diagnosis not present

## 2021-10-09 DIAGNOSIS — G4733 Obstructive sleep apnea (adult) (pediatric): Secondary | ICD-10-CM | POA: Diagnosis not present

## 2021-11-21 ENCOUNTER — Other Ambulatory Visit: Payer: Self-pay | Admitting: Internal Medicine

## 2021-11-22 NOTE — Telephone Encounter (Signed)
Please schedule 6 month F/U appt for 90 day refills. Thank you! 

## 2021-12-26 DIAGNOSIS — M25561 Pain in right knee: Secondary | ICD-10-CM | POA: Diagnosis not present

## 2021-12-26 DIAGNOSIS — M1711 Unilateral primary osteoarthritis, right knee: Secondary | ICD-10-CM | POA: Diagnosis not present

## 2021-12-26 DIAGNOSIS — Z96652 Presence of left artificial knee joint: Secondary | ICD-10-CM | POA: Diagnosis not present

## 2021-12-26 DIAGNOSIS — M25562 Pain in left knee: Secondary | ICD-10-CM | POA: Diagnosis not present

## 2022-01-13 NOTE — Progress Notes (Unsigned)
Cardiology Office Note    Date:  01/15/2022   ID:  Cheryl, Velez [birth year], MRN [id number]  PCP:  [name], MD  Cardiologist:  [name], MD  Electrophysiologist:  None   Chief Complaint: Follow-up  History of Present Illness:   Cheryl Velez is a 81 y.o. female with history of apical hypertrophic cardiomyopathy, HTN, HLD, obstructive sleep apnea, hypothyroidism, osteoarthritis, PTSD, and GERD who presents for follow-up of her cardiomyopathy.   Nuclear stress test in 12/2012 showed no evidence of reversible/inducible ischemia with an EF of 61%.  LHC in 05/2017 showed widely patent coronary arteries with minor nonobstructive CAD.  There was vigorous LV systolic function with an EF greater than 65% and apical hypertrophy noted.  Medical management was advised.  Echo in 06/2017 showed an EF of 55 to 60%, moderate concentric LVH, no regional wall motion abnormalities, grade 2 diastolic dysfunction, trivial aortic insufficiency, trivial mitral regurgitation, mildly dilated left atrium, normal RV systolic function and ventricular cavity size, and an estimated PASP of 31 mmHg.  Holter monitor in 07/2017 showed a predominant rhythm of sinus with an average rate of 64 (range 49 to 106 bpm), rare PACs and PVCs, and no sustained arrhythmias or prolonged pauses.   She was seen in the ED on 11/14/2020 with chest tightness.  Work-up was largely unrevealing, though high-sensitivity troponin was borderline elevated with an initial value of 17 and a delta of 22.  D-dimer was negative.  EKG demonstrated stable ST-T findings consistent with apical hypertrophic cardiomyopathy.  She was discharged on Imdur at request of Dr. [name].  She was seen in the office on 12/05/2020 and indicated she had been feeling better.  She did continue to note occasional vague chest discomfort, though was improved from the sharp chest pain and chest pressure that led to her ED visit in 10/2020.  In hindsight, she wondered if having  stopped famotidine prior to the onset of chest pain contributed to her symptoms.  She also noted occasional brief palpitations that were not associated with her above chest pain.  Given resolution of symptoms, she preferred to defer further ischemic evaluation at that time with recommendation to continue medical management.  She was last seen in the office in 06/2021 and was without symptoms of angina or decompensation.  She is present doing well from a cardiac perspective and is without symptoms of angina or decompensation.  She does continue to note a stable mild discomfort along the epigastric region that she wonders if it may be "gas."  Symptoms will last for 60 to 90 seconds followed by spontaneous resolution.  Symptoms are nonexertional.  She is without symptoms of syncope.  No palpitations.  No significant lower extremity swelling.  She remains active at baseline and overall feels like she is doing well.     Labs independently reviewed: 10/2020 - albumin 3.9, AST/ALT normal, Hgb 13.1, PLT 151, potassium 4.3, BUN 17, serum creatinine 0.65 12/2012 - TC 171, TG 215, HDL 38, LDL 90    Past Medical History:  Diagnosis Date   Adenomatous colon polyp    Arthritis    Chest pain 01/06/2013   Chest pain    GI (gastrointestinal bleed)    [name], ADENOMATOUS POLYP 6/14, 3 YEAR FOLLOW UP, 9/17, 3 YEAR FOLLOW UP.   Hearing loss    BIATERAL HEARING AIDS, [name]   Hypercholesterolemia    Hyperlipidemia    Hypertension    DX 8/16   Hypothyroidism  OSA (obstructive sleep apnea)    (PSG 03/21/12 ESS 6, AHI 14/hr REM 44/hr, RDI SAME, O2 min 77% APAP STARTED 12/15), [name]   Osteoarthritis    KNEES, HANDS HAS SEEN [name]   PTSD (post-traumatic stress disorder)    Thyroid disease     Past Surgical History:  Procedure Laterality Date   CARDIAC CATHETERIZATION     KNEE SURGERY     LEFT HEART CATH AND CORONARY ANGIOGRAPHY N/A 06/23/2017   Procedure: LEFT HEART CATH AND CORONARY  ANGIOGRAPHY;  Surgeon: [name], MD;  Location: [place] [facility];  Service: Cardiovascular;  Laterality: N/A;    Current Medications: Current Meds  Medication Sig   albuterol (VENTOLIN HFA) 108 (90 Base) MCG/ACT inhaler Inhale 1-2 puffs into the lungs every 6 (six) hours as needed.   b complex vitamins tablet Take 1 tablet by mouth at bedtime. SUPER B COMPLEX   Calcium Carb-Cholecalciferol (CALCIUM 600+D3 PO) Take 1 tablet by mouth at bedtime.   Cholecalciferol (VITAMIN D3) 2000 units TABS Take 2,000 Units by mouth at bedtime.   cholestyramine light (PREVALITE) 4 g packet Take 4 g by mouth 2 (two) times daily.   fenofibrate 160 MG tablet Take 160 mg by mouth at bedtime.    hyoscyamine ([name]) 0.125 MG tablet Take 0.125 mg by mouth every 4 (four) hours as needed for cramping.   isosorbide mononitrate (IMDUR) 30 MG 24 hr tablet TAKE 1 TABLET DAILY   levothyroxine (SYNTHROID, LEVOTHROID) 75 MCG tablet Take 75 mcg by mouth daily at 2 am.    losartan (COZAAR) 50 MG tablet Take 1 tablet (50 mg total) by mouth at bedtime.   metoprolol tartrate (LOPRESSOR) 25 MG tablet Take 0.5 tablets (12.5 mg total) by mouth 2 (two) times daily.   Multiple Vitamin (MULTIVITAMIN WITH MINERALS) TABS tablet Take 1 tablet by mouth at bedtime.    naproxen sodium (ANAPROX) 550 MG tablet Take 550 mg by mouth 2 (two) times daily as needed (FOR PAIN.).   pantoprazole (PROTONIX) 40 MG tablet Take 40 mg by mouth daily.    Allergies:   Lipitor [atorvastatin], Paxil [paroxetine hcl], Shellfish allergy, Latex, Nickel, and Other   Social History   Socioeconomic History   Marital status: Married    Spouse name: Journalist, newspaper   Number of children: 3   Years of education: Not on file   Highest education level: Not on file  Occupational History   Occupation: RETIRED  Tobacco Use   Smoking status: Never   Smokeless tobacco: Never  Vaping Use   Vaping Use: Never used  Substance and Sexual Activity   Alcohol use: Yes     Comment: occassional glass of wine   Drug use: No   Sexual activity: Not on file  Other Topics Concern   Not on file  Social History Narrative   Not on file   Social Determinants of Health   Financial Resource Strain: Not on file  Food Insecurity: Not on file  Transportation Needs: Not on file  Physical Activity: Not on file  Stress: Not on file  Social Connections: Not on file     Family History:  The patient's family history includes Alcohol abuse in her sister; Blindness in her brother; Breast cancer in her paternal grandmother; CAD in her father, maternal grandfather, maternal grandmother, paternal grandfather, and paternal grandmother; CVA in her brother; Cirrhosis in her father; Colon polyps in her mother; Diabetes (age of onset: 46) in her brother; Diverticulitis in her sister;  Gout in her sister; Heart Problems in her brother; Heart Problems (age of onset: 70) in her brother; Heart attack in her brother; Hypertension in her brother, brother, father, mother, sister, and sister; Hypertension (age of onset: 19) in her brother; Obesity in her sister; Other in her brother; Polycythemia in her sister; Stroke in her mother; Suicidality in her sister.  ROS:   12-point review of systems is negative unless otherwise noted in the HPI.   EKGs/Labs/Other Studies Reviewed:    Studies reviewed were summarized above. The additional studies were reviewed today:  48-hour Holter 07/2017: The patient was monitored for 48 hours. The predominant rhythm was sinus with an average rate of 64 bpm (range 49-106 bpm). The longest R-R interval was 1.4 seconds. Rare PAC's and PVC's were observed. No sustained arrhythmia or prolonged pause was seen. Patient diary events correspond to sinus rhythm.   Predominantly sinus rhythm with rare PAC's and PVC's.  No significant arrhythmias. __________   2D echo 07/06/2017: - Left ventricle: The cavity size was normal. There was moderate    concentric  hypertrophy. Systolic function was normal. The    estimated ejection fraction was in the range of 55% to 60%. Wall    motion was normal; there were no regional wall motion    abnormalities. Features are consistent with a pseudonormal left    ventricular filling pattern, with concomitant abnormal relaxation    and increased filling pressure (grade 2 diastolic dysfunction).    Doppler parameters are consistent with high ventricular filling    pressure.  - Aortic valve: Transvalvular velocity was within the normal range.    There was no stenosis. There was trivial regurgitation.  - Mitral valve: Transvalvular velocity was within the normal range.    There was no evidence for stenosis. There was trivial    regurgitation.  - Left atrium: The atrium was mildly dilated.  - Right ventricle: The cavity size was normal. Wall thickness was    normal. Systolic function was normal.  - Atrial septum: No defect or patent foramen ovale was identified    by color flow Doppler.  - Tricuspid valve: There was mild regurgitation.  - Pulmonary arteries: Systolic pressure was within the normal    range. PA peak pressure: 31 mm Hg (S). __________   LHC 06/23/2017: 1. Widely patent coronary arteries with minor nonobstructive CAD  2. Vigorous LV function with LVEF >65% and apical hypertrophy noted 3. Moderately elevated LVEDP   Recommend: medical management __________   Nuclear stress test 01/08/2013: 1.  No evidence of reversible/inducible ischemia.  2.  Normal ventricular wall motion.  3.  Calculated ejection fraction 61%.    EKG:  EKG is ordered today.  The EKG ordered today demonstrates NSR, 66 bpm, LVH with anterolateral and inferior T wave inversion consistent with history of apical hypertrophic cardiomyopathy and without significant change when compared to prior tracing  Recent Labs: No results found for requested labs within last 365 days.  Recent Lipid Panel    Component Value Date/Time    CHOL 171 01/08/2013 0017   TRIG 215 (H) 01/08/2013 0017   HDL 38 (L) 01/08/2013 0017   CHOLHDL 4.5 01/08/2013 0017   VLDL 43 (H) 01/08/2013 0017   LDLCALC 90 01/08/2013 0017    PHYSICAL EXAM:    VS:  BP 120/70 (BP Location: Left Arm, Patient Position: Sitting, Cuff Size: Large)   Pulse 66   Ht '5\' 4"'$  (1.626 m)   Wt 203 lb (92.1 kg)  SpO2 98%   BMI 34.84 kg/m   BMI: Body mass index is 34.84 kg/m.  Physical Exam Vitals reviewed.  Constitutional:      Appearance: She is well-developed.  HENT:     Head: Normocephalic and atraumatic.  Eyes:     General:        Right eye: No discharge.        Left eye: No discharge.  Neck:     Vascular: No JVD.  Cardiovascular:     Rate and Rhythm: Normal rate and regular rhythm.     Pulses:          Carotid pulses are  on the left side with bruit.      Posterior tibial pulses are 2+ on the right side and 2+ on the left side.     Heart sounds: Normal heart sounds, S1 normal and S2 normal. Heart sounds not distant. No midsystolic click and no opening snap. No murmur heard.    No friction rub.  Pulmonary:     Effort: Pulmonary effort is normal. No respiratory distress.     Breath sounds: Normal breath sounds. No decreased breath sounds, wheezing or rales.  Chest:     Chest wall: No tenderness.  Abdominal:     General: There is no distension.  Musculoskeletal:     Cervical back: Normal range of motion.     Right lower leg: No edema.     Left lower leg: No edema.  Skin:    General: Skin is warm and dry.     Nails: There is no clubbing.  Neurological:     Mental Status: She is alert and oriented to person, place, and time.  Psychiatric:        Speech: Speech normal.        Behavior: Behavior normal.        Thought Content: Thought content normal.        Judgment: Judgment normal.     Wt Readings from Last 3 Encounters:  01/15/22 203 lb (92.1 kg)  07/18/21 200 lb (90.7 kg)  01/18/21 197 lb (89.4 kg)     ASSESSMENT & PLAN:    Atypical chest pain: Diagnostic LHC in 2019 showed no evidence of obstructive disease with minor luminal irregularities.  Overall, she continues to do well without symptoms concerning for angina.  She remains on Imdur and Lopressor.  Aggressive risk factor modification.  Apical hypertrophic cardiomyopathy: No syncope.  She remains on Lopressor.  Left-sided carotid bruit: Schedule carotid artery ultrasound.  Intolerant to atorvastatin secondary to myalgias.  HTN: Blood pressure is well controlled in the office today.  Continue current medical therapy.  Hypertriglyceridemia: She remains on fenofibrate.  Managed by PCP.    Disposition: F/u with Dr. Saunders Revel or an APP in 6 months.   Medication Adjustments/Labs and Tests Ordered: Current medicines are reviewed at length with the patient today.  Concerns regarding medicines are outlined above. Medication changes, Labs and Tests ordered today are summarized above and listed in the Patient Instructions accessible in Encounters.   Signed, Christell Faith, PA-C 01/15/2022 11:27 AM     [address] [place], [place] [address] [phone]

## 2022-01-15 ENCOUNTER — Encounter: Payer: Self-pay | Admitting: Physician Assistant

## 2022-01-15 ENCOUNTER — Ambulatory Visit: Payer: Medicare Other | Attending: Physician Assistant | Admitting: Physician Assistant

## 2022-01-15 VITALS — BP 120/70 | HR 66 | Ht 64.0 in | Wt 203.0 lb

## 2022-01-15 DIAGNOSIS — E781 Pure hyperglyceridemia: Secondary | ICD-10-CM | POA: Diagnosis not present

## 2022-01-15 DIAGNOSIS — R0789 Other chest pain: Secondary | ICD-10-CM | POA: Diagnosis not present

## 2022-01-15 DIAGNOSIS — R0989 Other specified symptoms and signs involving the circulatory and respiratory systems: Secondary | ICD-10-CM | POA: Insufficient documentation

## 2022-01-15 DIAGNOSIS — I422 Other hypertrophic cardiomyopathy: Secondary | ICD-10-CM | POA: Insufficient documentation

## 2022-01-15 DIAGNOSIS — I10 Essential (primary) hypertension: Secondary | ICD-10-CM | POA: Insufficient documentation

## 2022-01-15 NOTE — Patient Instructions (Signed)
Medication Instructions:   Your physician recommends that you continue on your current medications as directed. Please refer to the Current Medication list given to you today.  *If you need a refill on your cardiac medications before your next appointment, please call your pharmacy*   Lab Work:  None Ordered  If you have labs (blood work) drawn today and your tests are completely normal, you will receive your results only by: [place] (if you have MyChart) OR A paper copy in the mail If you have any lab test that is abnormal or we need to change your treatment, we will call you to review the results.   Testing/Procedures:  Your physician has requested that you have a carotid duplex. This test is an ultrasound of the carotid arteries in your neck. It looks at blood flow through these arteries that supply the brain with blood. Allow one hour for this exam. There are no restrictions or special instructions.    Follow-Up: At [facility], you and your health needs are our priority.  As part of our continuing mission to provide you with exceptional heart care, we have created designated Provider Care Teams.  These Care Teams include your primary Cardiologist (physician) and Advanced Practice Providers (APPs -  Physician Assistants and Nurse Practitioners) who all work together to provide you with the care you need, when you need it.  We recommend signing up for the patient portal called "MyChart".  Sign up information is provided on this After Visit Summary.  MyChart is used to connect with patients for Virtual Visits (Telemedicine).  Patients are able to view lab/test results, encounter notes, upcoming appointments, etc.  Non-urgent messages can be sent to your provider as well.   To learn more about what you can do with MyChart, go to [email or web address].    Your next appointment:   6 month(s)  The format for your next appointment:   In Person  Provider:   You may  see [name], MD or one of the following Advanced Practice Providers on your designated Care Team:   Murray Hodgkins, NP Christell Faith, PA-C Cadence Kathlen Mody, PA-C Gerrie Nordmann, NP  Important Information About Sugar

## 2022-01-16 ENCOUNTER — Ambulatory Visit: Payer: TRICARE For Life (TFL) | Admitting: Internal Medicine

## 2022-02-20 ENCOUNTER — Ambulatory Visit: Payer: Medicare Other | Attending: Physician Assistant

## 2022-02-20 DIAGNOSIS — R0989 Other specified symptoms and signs involving the circulatory and respiratory systems: Secondary | ICD-10-CM | POA: Insufficient documentation

## 2022-02-26 ENCOUNTER — Other Ambulatory Visit: Payer: Self-pay | Admitting: Internal Medicine

## 2022-03-13 DIAGNOSIS — E785 Hyperlipidemia, unspecified: Secondary | ICD-10-CM | POA: Diagnosis not present

## 2022-03-13 DIAGNOSIS — F439 Reaction to severe stress, unspecified: Secondary | ICD-10-CM | POA: Diagnosis not present

## 2022-03-13 DIAGNOSIS — I10 Essential (primary) hypertension: Secondary | ICD-10-CM | POA: Diagnosis not present

## 2022-03-13 DIAGNOSIS — E669 Obesity, unspecified: Secondary | ICD-10-CM | POA: Diagnosis not present

## 2022-03-13 DIAGNOSIS — G4733 Obstructive sleep apnea (adult) (pediatric): Secondary | ICD-10-CM | POA: Diagnosis not present

## 2022-03-13 DIAGNOSIS — K219 Gastro-esophageal reflux disease without esophagitis: Secondary | ICD-10-CM | POA: Diagnosis not present

## 2022-03-13 DIAGNOSIS — Z23 Encounter for immunization: Secondary | ICD-10-CM | POA: Diagnosis not present

## 2022-03-13 DIAGNOSIS — E039 Hypothyroidism, unspecified: Secondary | ICD-10-CM | POA: Diagnosis not present

## 2022-06-18 ENCOUNTER — Other Ambulatory Visit: Payer: Self-pay | Admitting: Internal Medicine

## 2022-07-16 ENCOUNTER — Ambulatory Visit: Payer: TRICARE For Life (TFL) | Admitting: Internal Medicine

## 2022-07-26 DIAGNOSIS — R051 Acute cough: Secondary | ICD-10-CM | POA: Diagnosis not present

## 2022-07-26 DIAGNOSIS — J40 Bronchitis, not specified as acute or chronic: Secondary | ICD-10-CM | POA: Diagnosis not present

## 2022-07-26 DIAGNOSIS — U071 COVID-19: Secondary | ICD-10-CM | POA: Diagnosis not present

## 2022-07-26 DIAGNOSIS — Z03818 Encounter for observation for suspected exposure to other biological agents ruled out: Secondary | ICD-10-CM | POA: Diagnosis not present

## 2022-07-30 ENCOUNTER — Ambulatory Visit: Payer: TRICARE For Life (TFL) | Admitting: Internal Medicine

## 2022-08-06 NOTE — Progress Notes (Signed)
Cardiology Office Note    Date:  08/08/2022   ID:  Cheryl Velez, DOB [birth year], MRN [id number]  PCP:  Laurann Montana, MD  Cardiologist:  Yvonne Kendall, MD  Electrophysiologist:  None   Chief Complaint: Follow up  History of Present Illness:   Cheryl Velez is a 82 y.o. female with history of apical hypertrophic cardiomyopathy, HTN, HLD, obstructive sleep apnea, hypothyroidism, osteoarthritis, PTSD, and GERD who presents for follow-up of her cardiomyopathy.   Nuclear stress test in 12/2012 showed no evidence of reversible/inducible ischemia with an EF of 61%.  LHC in 05/2017 showed widely patent coronary arteries with minor nonobstructive CAD.  There was vigorous LV systolic function with an EF greater than 65% and apical hypertrophy noted.  Medical management was advised.  Echo in 06/2017 showed an EF of 55 to 60%, moderate concentric LVH, no regional wall motion abnormalities, grade 2 diastolic dysfunction, trivial aortic insufficiency, trivial mitral regurgitation, mildly dilated left atrium, normal RV systolic function and ventricular cavity size, and an estimated PASP of 31 mmHg.  Holter monitor in 07/2017 showed a predominant rhythm of sinus with an average rate of 64 (range 49 to 106 bpm), rare PACs and PVCs, and no sustained arrhythmias or prolonged pauses.   She was seen in the ED on 11/14/2020 with chest tightness.  Work-up was largely unrevealing, though high-sensitivity troponin was borderline elevated with an initial value of 17 and a delta of 22.  D-dimer was negative.  EKG demonstrated stable ST-T findings consistent with apical hypertrophic cardiomyopathy.  She was discharged on Imdur at request of Dr. Areatha Keas.  She was seen in the office on 12/05/2020 and indicated she had been feeling better.  She did continue to note occasional vague chest discomfort, though was improved from the sharp chest pain and chest pressure that led to her ED visit in 10/2020.  In hindsight, she wondered if having  stopped famotidine prior to the onset of chest pain contributed to her symptoms.  She also noted occasional brief palpitations that were not associated with her above chest pain.  Given resolution of symptoms, she preferred to defer further ischemic evaluation at that time with recommendation to continue medical management.  She was last seen in the office in 12/2020 and remained without symptoms of angina or cardiac decompensation.  She continued to note mild discomfort along the epigastric region that would last for 60 to 90 seconds with spontaneous resolution.  Symptoms were nonexertional.  She underwent carotid artery ultrasound in 01/2022 which demonstrated no evidence of stenosis in the bilateral ICAs with antegrade flow of the bilateral vertebral arteries and normal flow hemodynamics of the bilateral subclavian arteries.  She Velez in doing very well from a cardiac perspective and is without dose of angina or cardiac decompensation.  No dyspnea, palpitations, dizziness, presyncope, or syncope.  No falls or bleeding concerns.  Adherent and tolerating all cardiac medications without issues.  Overall, she feels like she is doing very well and does not have any acute cardiac concerns at this time.   Labs independently reviewed: 07/2021 - magnesium 2.1 10/2020 - albumin 3.9, AST/ALT normal, Hgb 13.1, PLT 151, potassium 4.3, BUN 17, serum creatinine 0.65 12/2012 - TC 171, TG 215, HDL 38, LDL 90  Past Medical History:  Diagnosis Date   Adenomatous colon polyp    Arthritis    Chest pain 01/06/2013   Chest pain    GI (gastrointestinal bleed)    DR. Bosie Clos, ADENOMATOUS POLYP 6/14, 3  YEAR FOLLOW UP, 9/17, 3 YEAR FOLLOW UP.   Hearing loss    BIATERAL HEARING AIDS, DR. Memory Argue   Hypercholesterolemia    Hyperlipidemia    Hypertension    DX 8/16   Hypothyroidism    OSA (obstructive sleep apnea)    (PSG 03/21/12 ESS 6, AHI 14/hr REM 44/hr, RDI SAME, O2 min 77% APAP STARTED 12/15), DR. Earl Gala    Osteoarthritis    KNEES, HANDS HAS SEEN [name]   PTSD (post-traumatic stress disorder)    Thyroid disease     Past Surgical History:  Procedure Laterality Date   CARDIAC CATHETERIZATION     KNEE SURGERY     LEFT HEART CATH AND CORONARY ANGIOGRAPHY N/A 06/23/2017   Procedure: LEFT HEART CATH AND CORONARY ANGIOGRAPHY;  Surgeon: Tonny Bollman, MD;  Location: [facility] INVASIVE CV LAB;  Service: Cardiovascular;  Laterality: N/A;    Current Medications: Current Meds  Medication Sig   albuterol (VENTOLIN HFA) 108 (90 Base) MCG/ACT inhaler Inhale 1-2 puffs into the lungs every 6 (six) hours as needed.   b complex vitamins tablet Take 1 tablet by mouth at bedtime. SUPER B COMPLEX   Calcium Carb-Cholecalciferol (CALCIUM 600+D3 PO) Take 1 tablet by mouth at bedtime.   Cholecalciferol (VITAMIN D3) 2000 units TABS Take 2,000 Units by mouth at bedtime.   cholestyramine light (PREVALITE) 4 g packet Take 4 g by mouth 2 (two) times daily.   fenofibrate 160 MG tablet Take 160 mg by mouth at bedtime.    hyoscyamine ([name]) 0.125 MG tablet Take 0.125 mg by mouth every 4 (four) hours as needed for cramping.   isosorbide mononitrate (IMDUR) 30 MG 24 hr tablet TAKE 1 TABLET DAILY   levothyroxine (SYNTHROID, LEVOTHROID) 75 MCG tablet Take 75 mcg by mouth daily at 2 am.    losartan (COZAAR) 50 MG tablet Take 1 tablet (50 mg total) by mouth at bedtime.   metoprolol tartrate (LOPRESSOR) 25 MG tablet Take 0.5 tablets (12.5 mg total) by mouth 2 (two) times daily.   Multiple Vitamin (MULTIVITAMIN WITH MINERALS) TABS tablet Take 1 tablet by mouth at bedtime.    naproxen sodium (ANAPROX) 550 MG tablet Take 550 mg by mouth 2 (two) times daily as needed (FOR PAIN.).   pantoprazole (PROTONIX) 40 MG tablet Take 40 mg by mouth daily.    Allergies:   Lipitor [atorvastatin], Paxil [paroxetine hcl], Shellfish allergy, Latex, Nickel, and Other   Social History   Socioeconomic History   Marital status: Married     Spouse name: Hydrologist   Number of children: 3   Years of education: Not on file   Highest education level: Not on file  Occupational History   Occupation: RETIRED  Tobacco Use   Smoking status: Never   Smokeless tobacco: Never  Vaping Use   Vaping Use: Never used  Substance and Sexual Activity   Alcohol use: Yes    Comment: occassional glass of wine   Drug use: No   Sexual activity: Not on file  Other Topics Concern   Not on file  Social History Narrative   Not on file   Social Determinants of Health   Financial Resource Strain: Not on file  Food Insecurity: Not on file  Transportation Needs: Not on file  Physical Activity: Not on file  Stress: Not on file  Social Connections: Not on file     Family History:  The patient's family history includes Alcohol abuse in her sister; Blindness in her brother; Breast cancer  in her paternal grandmother; CAD in her father, maternal grandfather, maternal grandmother, paternal grandfather, and paternal grandmother; COPD in her sister; CVA in her brother; Cirrhosis in her father; Colon polyps in her mother; Diabetes (age of onset: 5170) in her brother; Diverticulitis in her sister; Gout in her sister; Heart Problems in her brother; Heart Problems (age of onset: 8167) in her brother; Heart attack in her brother; Hypertension in her brother, brother, father, mother, sister, and sister; Hypertension (age of onset: 8057) in her brother; Obesity in her sister; Other in her brother; Polycythemia in her sister; Stroke in her mother; Suicidality in her sister.  ROS:   12-point review of systems is negative unless otherwise noted in the HPI.   EKGs/Labs/Other Studies Reviewed:    Studies reviewed were summarized above. The additional studies were reviewed today:  Carotid artery ultrasound 02/20/2022: Summary:  Right Carotid: There is no evidence of stenosis in the right ICA.   Left Carotid: There is no evidence of stenosis in the left ICA.    Vertebrals:  Bilateral vertebral arteries demonstrate antegrade flow.  Subclavians: Normal flow hemodynamics were seen in bilateral subclavian arteries.  __________  48-hour Holter 07/2017: The patient was monitored for 48 hours. The predominant rhythm was sinus with an average rate of 64 bpm (range 49-106 bpm). The longest R-R interval was 1.4 seconds. Rare PAC's and PVC's were observed. No sustained arrhythmia or prolonged pause was seen. Patient diary events correspond to sinus rhythm.   Predominantly sinus rhythm with rare PAC's and PVC's.  No significant arrhythmias. __________   2D echo 07/06/2017: - Left ventricle: The cavity size was normal. There was moderate    concentric hypertrophy. Systolic function was normal. The    estimated ejection fraction was in the range of 55% to 60%. Wall    motion was normal; there were no regional wall motion    abnormalities. Features are consistent with a pseudonormal left    ventricular filling pattern, with concomitant abnormal relaxation    and increased filling pressure (grade 2 diastolic dysfunction).    Doppler parameters are consistent with high ventricular filling    pressure.  - Aortic valve: Transvalvular velocity was within the normal range.    There was no stenosis. There was trivial regurgitation.  - Mitral valve: Transvalvular velocity was within the normal range.    There was no evidence for stenosis. There was trivial    regurgitation.  - Left atrium: The atrium was mildly dilated.  - Right ventricle: The cavity size was normal. Wall thickness was    normal. Systolic function was normal.  - Atrial septum: No defect or patent foramen ovale was identified    by color flow Doppler.  - Tricuspid valve: There was mild regurgitation.  - Pulmonary arteries: Systolic pressure was within the normal    range. PA peak pressure: 31 mm Hg (S). __________   LHC 06/23/2017: 1. Widely patent coronary arteries with minor  nonobstructive CAD  2. Vigorous LV function with LVEF >65% and apical hypertrophy noted 3. Moderately elevated LVEDP   Recommend: medical management __________   Nuclear stress test 01/08/2013: 1.  No evidence of reversible/inducible ischemia.  2.  Normal ventricular wall motion.  3.  Calculated ejection fraction 61%.    EKG:  EKG is ordered today.  The EKG ordered today demonstrates NSR, 63 bpm, anterolateral T wave inversion consistent with prior tracings and known apical hypertrophic cardiomyopathy  Recent Labs: No results found for requested labs within last  365 days.  Recent Lipid Panel    Component Value Date/Time   CHOL 171 01/08/2013 0017   TRIG 215 (H) 01/08/2013 0017   HDL 38 (L) 01/08/2013 0017   CHOLHDL 4.5 01/08/2013 0017   VLDL 43 (H) 01/08/2013 0017   LDLCALC 90 01/08/2013 0017    PHYSICAL EXAM:    VS:  BP 132/62 (BP Location: Left Arm, Patient Position: Sitting, Cuff Size: Large)   Pulse 63   Ht 5' 4.5" (1.638 m)   Wt 196 lb 12.8 oz (89.3 kg)   SpO2 95%   BMI 33.26 kg/m   BMI: Body mass index is 33.26 kg/m.  Physical Exam Vitals reviewed.  Constitutional:      Appearance: She is well-developed.  HENT:     Head: Normocephalic and atraumatic.  Eyes:     General:        Right eye: No discharge.        Left eye: No discharge.  Neck:     Vascular: No JVD.  Cardiovascular:     Rate and Rhythm: Normal rate and regular rhythm.     Heart sounds: Normal heart sounds, S1 normal and S2 normal. Heart sounds not distant. No midsystolic click and no opening snap. No murmur heard.    No friction rub.  Pulmonary:     Effort: Pulmonary effort is normal. No respiratory distress.     Breath sounds: Normal breath sounds. No decreased breath sounds, wheezing or rales.  Chest:     Chest wall: No tenderness.  Abdominal:     General: There is no distension.  Musculoskeletal:     Cervical back: Normal range of motion.     Right lower leg: No edema.     Left lower  leg: No edema.  Skin:    General: Skin is warm and dry.     Nails: There is no clubbing.  Neurological:     Mental Status: She is alert and oriented to person, place, and time.  Psychiatric:        Speech: Speech normal.        Behavior: Behavior normal.        Thought Content: Thought content normal.        Judgment: Judgment normal.     Wt Readings from Last 3 Encounters:  08/08/22 196 lb 12.8 oz (89.3 kg)  01/15/22 203 lb (92.1 kg)  07/18/21 200 lb (90.7 kg)     ASSESSMENT & PLAN:   Atypical chest pain: Diagnostic LHC in 2019 showed no evidence of obstructive coronary artery disease with minor luminal irregularities.  Overall, she continues to do very well without symptoms concerning for angina or cardiac decompensation.  Continue aggressive risk factor modification and primary prevention.  She remains on Imdur.  No indication for further ischemic testing at this time.  Apical hypertrophic cardiomyopathy: No syncope.  She remains on Lopressor.  HTN: Blood pressure is well-controlled.  Continue current medical therapy.  Hypertriglyceridemia: She remains on fenofibrate.  Managed by PCP.   Disposition: F/u with Dr. Okey Dupre or an APP in 12 months.   Medication Adjustments/Labs and Tests Ordered: Current medicines are reviewed at length with the patient today.  Concerns regarding medicines are outlined above. Medication changes, Labs and Tests ordered today are summarized above and listed in the Patient Instructions accessible in Encounters.   Signed, Eula Listen, PA-C 08/08/2022 2:51 PM     [facility] [place] [address] [place], [place] [address] [phone] 

## 2022-08-08 ENCOUNTER — Encounter: Payer: Self-pay | Admitting: Physician Assistant

## 2022-08-08 ENCOUNTER — Ambulatory Visit: Payer: Medicare Other | Attending: Internal Medicine | Admitting: Physician Assistant

## 2022-08-08 VITALS — BP 132/62 | HR 63 | Ht 64.5 in | Wt 196.8 lb

## 2022-08-08 DIAGNOSIS — I10 Essential (primary) hypertension: Secondary | ICD-10-CM

## 2022-08-08 DIAGNOSIS — I422 Other hypertrophic cardiomyopathy: Secondary | ICD-10-CM

## 2022-08-08 DIAGNOSIS — R0789 Other chest pain: Secondary | ICD-10-CM | POA: Diagnosis not present

## 2022-08-08 DIAGNOSIS — E781 Pure hyperglyceridemia: Secondary | ICD-10-CM

## 2022-08-08 NOTE — Patient Instructions (Signed)
Medication Instructions:  No changes at this time.   *If you need a refill on your cardiac medications before your next appointment, please call your pharmacy*   Lab Work: None  If you have labs (blood work) drawn today and your tests are completely normal, you will receive your results only by: MyChart Message (if you have MyChart) OR A paper copy in the mail If you have any lab test that is abnormal or we need to change your treatment, we will call you to review the results.   Testing/Procedures: None   Follow-Up: At [facility], you and your health needs are our priority.  As part of our continuing mission to provide you with exceptional heart care, we have created designated Provider Care Teams.  These Care Teams include your primary Cardiologist (physician) and Advanced Practice Providers (APPs -  Physician Assistants and Nurse Practitioners) who all work together to provide you with the care you need, when you need it.   Your next appointment:   1 year(s)  Provider:   Christopher End, MD or Ryan Dunn, PA-C    

## 2022-08-14 DIAGNOSIS — Z0000 Encounter for general adult medical examination without abnormal findings: Secondary | ICD-10-CM | POA: Diagnosis not present

## 2022-08-14 DIAGNOSIS — I10 Essential (primary) hypertension: Secondary | ICD-10-CM | POA: Diagnosis not present

## 2022-08-14 DIAGNOSIS — K219 Gastro-esophageal reflux disease without esophagitis: Secondary | ICD-10-CM | POA: Diagnosis not present

## 2022-08-14 DIAGNOSIS — R7303 Prediabetes: Secondary | ICD-10-CM | POA: Diagnosis not present

## 2022-08-14 DIAGNOSIS — E669 Obesity, unspecified: Secondary | ICD-10-CM | POA: Diagnosis not present

## 2022-08-14 DIAGNOSIS — I422 Other hypertrophic cardiomyopathy: Secondary | ICD-10-CM | POA: Diagnosis not present

## 2022-08-14 DIAGNOSIS — R7309 Other abnormal glucose: Secondary | ICD-10-CM | POA: Diagnosis not present

## 2022-08-14 DIAGNOSIS — Z86010 Personal history of colonic polyps: Secondary | ICD-10-CM | POA: Diagnosis not present

## 2022-08-14 DIAGNOSIS — Z79899 Other long term (current) drug therapy: Secondary | ICD-10-CM | POA: Diagnosis not present

## 2022-08-14 DIAGNOSIS — G4733 Obstructive sleep apnea (adult) (pediatric): Secondary | ICD-10-CM | POA: Diagnosis not present

## 2022-08-14 DIAGNOSIS — E785 Hyperlipidemia, unspecified: Secondary | ICD-10-CM | POA: Diagnosis not present

## 2022-08-14 DIAGNOSIS — E039 Hypothyroidism, unspecified: Secondary | ICD-10-CM | POA: Diagnosis not present

## 2022-08-14 DIAGNOSIS — F4312 Post-traumatic stress disorder, chronic: Secondary | ICD-10-CM | POA: Diagnosis not present

## 2022-08-14 DIAGNOSIS — M8588 Other specified disorders of bone density and structure, other site: Secondary | ICD-10-CM | POA: Diagnosis not present

## 2022-08-19 ENCOUNTER — Other Ambulatory Visit: Payer: Self-pay | Admitting: Family Medicine

## 2022-08-19 DIAGNOSIS — M8580 Other specified disorders of bone density and structure, unspecified site: Secondary | ICD-10-CM

## 2022-09-04 ENCOUNTER — Other Ambulatory Visit: Payer: Self-pay | Admitting: Family Medicine

## 2022-09-04 DIAGNOSIS — Z1231 Encounter for screening mammogram for malignant neoplasm of breast: Secondary | ICD-10-CM

## 2022-09-30 ENCOUNTER — Other Ambulatory Visit: Payer: Self-pay | Admitting: Internal Medicine

## 2022-10-07 DIAGNOSIS — G4733 Obstructive sleep apnea (adult) (pediatric): Secondary | ICD-10-CM | POA: Diagnosis not present

## 2022-12-23 ENCOUNTER — Other Ambulatory Visit: Payer: Self-pay

## 2022-12-23 ENCOUNTER — Emergency Department: Payer: Medicare Other

## 2022-12-23 ENCOUNTER — Observation Stay
Admission: EM | Admit: 2022-12-23 | Discharge: 2022-12-24 | Disposition: A | Discharge status: Home / Self Care | Payer: Medicare Other | Attending: Internal Medicine | Admitting: Internal Medicine

## 2022-12-23 DIAGNOSIS — I4891 Unspecified atrial fibrillation: Secondary | ICD-10-CM

## 2022-12-23 DIAGNOSIS — K219 Gastro-esophageal reflux disease without esophagitis: Secondary | ICD-10-CM | POA: Insufficient documentation

## 2022-12-23 DIAGNOSIS — R079 Chest pain, unspecified: Secondary | ICD-10-CM | POA: Diagnosis not present

## 2022-12-23 DIAGNOSIS — I517 Cardiomegaly: Secondary | ICD-10-CM

## 2022-12-23 DIAGNOSIS — R0789 Other chest pain: Secondary | ICD-10-CM | POA: Diagnosis not present

## 2022-12-23 DIAGNOSIS — I422 Other hypertrophic cardiomyopathy: Secondary | ICD-10-CM | POA: Insufficient documentation

## 2022-12-23 DIAGNOSIS — Z91040 Latex allergy status: Secondary | ICD-10-CM | POA: Insufficient documentation

## 2022-12-23 DIAGNOSIS — E781 Pure hyperglyceridemia: Secondary | ICD-10-CM | POA: Diagnosis present

## 2022-12-23 DIAGNOSIS — I2489 Other forms of acute ischemic heart disease: Secondary | ICD-10-CM

## 2022-12-23 DIAGNOSIS — I480 Paroxysmal atrial fibrillation: Secondary | ICD-10-CM | POA: Diagnosis not present

## 2022-12-23 DIAGNOSIS — Z79899 Other long term (current) drug therapy: Secondary | ICD-10-CM | POA: Diagnosis not present

## 2022-12-23 DIAGNOSIS — R7989 Other specified abnormal findings of blood chemistry: Secondary | ICD-10-CM | POA: Diagnosis not present

## 2022-12-23 DIAGNOSIS — E039 Hypothyroidism, unspecified: Secondary | ICD-10-CM | POA: Diagnosis present

## 2022-12-23 DIAGNOSIS — I10 Essential (primary) hypertension: Secondary | ICD-10-CM | POA: Diagnosis present

## 2022-12-23 DIAGNOSIS — R918 Other nonspecific abnormal finding of lung field: Secondary | ICD-10-CM | POA: Diagnosis not present

## 2022-12-23 DIAGNOSIS — I420 Dilated cardiomyopathy: Secondary | ICD-10-CM

## 2022-12-23 LAB — BASIC METABOLIC PANEL
Anion gap: 9 (ref 5–15)
BUN: 22 mg/dL (ref 8–23)
CO2: 22 mmol/L (ref 22–32)
Calcium: 9.9 mg/dL (ref 8.9–10.3)
Chloride: 107 mmol/L (ref 98–111)
Creatinine, Ser: 0.93 mg/dL (ref 0.44–1.00)
GFR, Estimated: >60 mL/min (ref >60)
Glucose, Bld: 142 mg/dL — ABNORMAL HIGH (ref 70–99)
Potassium: 4.1 mmol/L (ref 3.5–5.1)
Sodium: 138 mmol/L (ref 135–145)

## 2022-12-23 LAB — CBC
HCT: 40.1 % (ref 36.0–46.0)
Hemoglobin: 13.1 g/dL (ref 12.0–15.0)
MCH: 30.2 pg (ref 26.0–34.0)
MCHC: 32.7 g/dL (ref 30.0–36.0)
MCV: 92.4 fL (ref 80.0–100.0)
Platelets: 258 10*3/uL (ref 150–400)
RBC: 4.34 MIL/uL (ref 3.87–5.11)
RDW: 12.7 % (ref 11.5–15.5)
WBC: 7.8 10*3/uL (ref 4.0–10.5)
nRBC: 0 % (ref 0.0–0.2)

## 2022-12-23 LAB — TSH: TSH: 2.753 u[IU]/mL (ref 0.350–4.500)

## 2022-12-23 LAB — BRAIN NATRIURETIC PEPTIDE: B Natriuretic Peptide: 598.4 pg/mL — ABNORMAL HIGH (ref 0.0–100.0)

## 2022-12-23 LAB — TROPONIN I (HIGH SENSITIVITY)
Troponin I (High Sensitivity): 33 ng/L — ABNORMAL HIGH (ref <18)
Troponin I (High Sensitivity): 77 ng/L — ABNORMAL HIGH (ref <18)
Troponin I (High Sensitivity): 189 ng/L (ref <18)

## 2022-12-23 MED ORDER — MAGNESIUM HYDROXIDE 400 MG/5ML PO SUSP: 30.0000 mL | Freq: Every day | ORAL | Status: DC | PRN

## 2022-12-23 MED ORDER — CHOLESTYRAMINE LIGHT 4 G PO PACK
4.0000 g | PACK | Freq: Two times a day (BID) | ORAL | Status: DC
  Administered 2022-12-24: 4 g via ORAL
  Filled 2022-12-23: qty 1

## 2022-12-23 MED ORDER — HYOSCYAMINE SULFATE 0.125 MG PO TBDP: 0.1250 mg | ORAL_TABLET | ORAL | Status: DC | PRN

## 2022-12-23 MED ORDER — PANTOPRAZOLE SODIUM 40 MG PO TBEC
40.0000 mg | DELAYED_RELEASE_TABLET | Freq: Every day | ORAL | Status: DC
  Administered 2022-12-24: 40 mg via ORAL
  Filled 2022-12-23: qty 1

## 2022-12-23 MED ORDER — ACETAMINOPHEN 325 MG PO TABS
650.0000 mg | ORAL_TABLET | ORAL | Status: DC | PRN
  Filled 2022-12-23: qty 2

## 2022-12-23 MED ORDER — NITROGLYCERIN 0.4 MG SL SUBL: 0.4000 mg | SUBLINGUAL_TABLET | SUBLINGUAL | Status: DC | PRN

## 2022-12-23 MED ORDER — MORPHINE SULFATE (PF) 2 MG/ML IV SOLN
2.0000 mg | INTRAVENOUS | Status: DC | PRN
  Filled 2022-12-23: qty 1

## 2022-12-23 MED ORDER — HEPARIN (PORCINE) 25000 UT/250ML-% IV SOLN
1000.0000 [IU]/h | INTRAVENOUS | Status: DC
  Administered 2022-12-23: 1000 [IU]/h via INTRAVENOUS
  Filled 2022-12-23: qty 250

## 2022-12-23 MED ORDER — FENOFIBRATE 160 MG PO TABS
160.0000 mg | ORAL_TABLET | Freq: Every day | ORAL | Status: DC
  Filled 2022-12-23: qty 1

## 2022-12-23 MED ORDER — SODIUM CHLORIDE 0.9 % IV SOLN: INTRAVENOUS | Status: DC

## 2022-12-23 MED ORDER — TRAZODONE HCL 50 MG PO TABS
25.0000 mg | ORAL_TABLET | Freq: Every evening | ORAL | Status: DC | PRN
  Filled 2022-12-23: qty 1

## 2022-12-23 MED ORDER — ROSUVASTATIN CALCIUM 10 MG PO TABS
20.0000 mg | ORAL_TABLET | Freq: Every day | ORAL | Status: DC
  Administered 2022-12-23 – 2022-12-24 (×2): 20 mg via ORAL
  Filled 2022-12-23 (×2): qty 2

## 2022-12-23 MED ORDER — MORPHINE SULFATE (PF) 2 MG/ML IV SOLN
2.0000 mg | INTRAVENOUS | Status: DC | PRN
  Administered 2022-12-23 – 2022-12-24 (×2): 2 mg via INTRAVENOUS
  Filled 2022-12-23 (×2): qty 1

## 2022-12-23 MED ORDER — ENOXAPARIN SODIUM 40 MG/0.4ML IJ SOSY
40.0000 mg | PREFILLED_SYRINGE | INTRAMUSCULAR | Status: DC
  Administered 2022-12-23: 40 mg via SUBCUTANEOUS
  Filled 2022-12-23: qty 0.4

## 2022-12-23 MED ORDER — ISOSORBIDE MONONITRATE ER 60 MG PO TB24
30.0000 mg | ORAL_TABLET | Freq: Every day | ORAL | Status: DC
  Administered 2022-12-24: 30 mg via ORAL
  Filled 2022-12-23: qty 1

## 2022-12-23 MED ORDER — ALBUTEROL SULFATE (2.5 MG/3ML) 0.083% IN NEBU: 3.0000 mL | INHALATION_SOLUTION | Freq: Four times a day (QID) | RESPIRATORY_TRACT | Status: DC | PRN

## 2022-12-23 MED ORDER — LEVOTHYROXINE SODIUM 50 MCG PO TABS
75.0000 ug | ORAL_TABLET | Freq: Every day | ORAL | Status: DC
  Administered 2022-12-24: 75 ug via ORAL
  Filled 2022-12-23: qty 1

## 2022-12-23 MED ORDER — ASPIRIN 325 MG PO TABS
325.0000 mg | ORAL_TABLET | Freq: Once | ORAL | Status: AC
  Administered 2022-12-23: 325 mg via ORAL
  Filled 2022-12-23: qty 1

## 2022-12-23 MED ORDER — METOPROLOL TARTRATE 25 MG PO TABS
12.5000 mg | ORAL_TABLET | Freq: Two times a day (BID) | ORAL | Status: DC
  Administered 2022-12-23 – 2022-12-24 (×2): 12.5 mg via ORAL
  Filled 2022-12-23 (×2): qty 1

## 2022-12-23 MED ORDER — OYSTER SHELL CALCIUM/D3 500-5 MG-MCG PO TABS
1.0000 | ORAL_TABLET | Freq: Every day | ORAL | Status: DC
  Administered 2022-12-23: 1 via ORAL
  Filled 2022-12-23: qty 1

## 2022-12-23 MED ORDER — LOSARTAN POTASSIUM 50 MG PO TABS
50.0000 mg | ORAL_TABLET | Freq: Every day | ORAL | Status: DC
  Administered 2022-12-23: 50 mg via ORAL
  Filled 2022-12-23: qty 1

## 2022-12-23 MED ORDER — FUROSEMIDE 10 MG/ML IJ SOLN
20.0000 mg | Freq: Two times a day (BID) | INTRAMUSCULAR | Status: DC
  Administered 2022-12-23: 20 mg via INTRAVENOUS
  Filled 2022-12-23: qty 2

## 2022-12-23 MED ORDER — ONDANSETRON HCL 4 MG/2ML IJ SOLN: 4.0000 mg | Freq: Four times a day (QID) | INTRAMUSCULAR | Status: DC | PRN

## 2022-12-23 MED ORDER — RENA-VITE PO TABS
1.0000 | ORAL_TABLET | Freq: Every day | ORAL | Status: DC
  Filled 2022-12-23: qty 1

## 2022-12-23 MED ORDER — VITAMIN D 25 MCG (1000 UNIT) PO TABS
2000.0000 [IU] | ORAL_TABLET | Freq: Every day | ORAL | Status: DC
  Administered 2022-12-23: 2000 [IU] via ORAL
  Filled 2022-12-23: qty 2

## 2022-12-23 MED ORDER — ADULT MULTIVITAMIN W/MINERALS CH
1.0000 | ORAL_TABLET | Freq: Every day | ORAL | Status: DC
  Filled 2022-12-23: qty 1

## 2022-12-23 NOTE — ED Provider Notes (Signed)
[facility] Provider Note    Event Date/Time   First MD Initiated Contact with Patient 12/23/22 1809     (approximate)   History   Chest Pain   HPI  Cheryl Velez is a 82 y.o. female  with history of hypertrophic cardiomyopathy, hypertension, OSA, hypothyroidism presenting to the emergency department for evaluation of chest tightness.  Patient was driving home from once in [place] earlier today when she had an approximately 45-minute episode of chest tightness described as a burning sensation with associated shortness of breath and nausea.  Denies history of similar.  She did additionally report some palpitations during this time.  Later, had some recurrence of this but currently reports that she is chest pain-free.  I did review her cardiology note from 08/08/2022.  At that time, she presented with atypical chest pain.  Previously had a left heart cath in 2019 without obstructive CAD.  No known history of arrhythmia.     Physical Exam   Triage Vital Signs: ED Triage Vitals  Encounter Vitals Group     BP 12/23/22 1537 (!) 148/112     Systolic BP Percentile --      Diastolic BP Percentile --      Pulse Rate 12/23/22 1537 (!) 109     Resp 12/23/22 1537 20     Temp 12/23/22 1539 98 F (36.7 C)     Temp src --      SpO2 12/23/22 1537 96 %     Weight 12/23/22 1536 192 lb (87.1 kg)     Height 12/23/22 1536 5\' 4"  (1.626 m)     Head Circumference --      Peak Flow --      Pain Score 12/23/22 1536 8     Pain Loc --      Pain Education --      Exclude from Growth Chart --     Most recent vital signs: Vitals:   12/23/22 1930 12/23/22 2227  BP: (!) 154/69 (!) 147/62  Pulse: 71 63  Resp: 17 16  Temp:  97.7 F (36.5 C)  SpO2: 100% 100%     General: Awake, interactive  CV:  Regular rate, good peripheral perfusion.  Resp:  Lungs clear, unlabored respirations.  Abd:  Soft, nondistended, nontender to palpation Neuro:  Symmetric facial movement, fluid  speech   ED Results / Procedures / Treatments   Labs (all labs ordered are listed, but only abnormal results are displayed) Labs Reviewed  BASIC METABOLIC PANEL - Abnormal; Notable for the following components:      Result Value   Glucose, Bld 142 (*)    All other components within normal limits  BRAIN NATRIURETIC PEPTIDE - Abnormal; Notable for the following components:   B Natriuretic Peptide 598.4 (*)    All other components within normal limits  TROPONIN I (HIGH SENSITIVITY) - Abnormal; Notable for the following components:   Troponin I (High Sensitivity) 33 (*)    All other components within normal limits  TROPONIN I (HIGH SENSITIVITY) - Abnormal; Notable for the following components:   Troponin I (High Sensitivity) 77 (*)    All other components within normal limits  TROPONIN I (HIGH SENSITIVITY) - Abnormal; Notable for the following components:   Troponin I (High Sensitivity) 189 (*)    All other components within normal limits  CBC  TSH  TROPONIN I (HIGH SENSITIVITY)     EKG EKG independently reviewed interpreted by myself (ER attending) demonstrates:  Initial EKG demonstrates A-fib at a rate of 120, QRS 80, QTc 446, nonspecific ST changes noted  RADIOLOGY Imaging independently reviewed and interpreted by myself demonstrates:  CXR without focal consolidation, but does demonstrate diffuse opacities, per radiology possibly reflective of atypical/viral infection or edema  PROCEDURES:  Critical Care performed: No  Procedures   MEDICATIONS ORDERED IN ED: Medications  cholestyramine light (PREVALITE) packet 4 g (has no administration in time range)  fenofibrate tablet 160 mg (has no administration in time range)  isosorbide mononitrate (IMDUR) 24 hr tablet 30 mg (has no administration in time range)  losartan (COZAAR) tablet 50 mg (50 mg Oral Given 12/23/22 2231)  metoprolol tartrate (LOPRESSOR) tablet 12.5 mg (12.5 mg Oral Given 12/23/22 2227)  levothyroxine  (SYNTHROID) tablet 75 mcg (has no administration in time range)  hyoscyamine (ANASPAZ) disintergrating tablet 0.125 mg (has no administration in time range)  pantoprazole (PROTONIX) EC tablet 40 mg (has no administration in time range)  multivitamin (RENA-VIT) tablet 1 tablet (has no administration in time range)  calcium-vitamin D (OSCAL WITH D) 500-5 MG-MCG per tablet 1 tablet (1 tablet Oral Given 12/23/22 2230)  cholecalciferol (VITAMIN D3) 25 MCG (1000 UNIT) tablet 2,000 Units (2,000 Units Oral Given 12/23/22 2228)  multivitamin with minerals tablet 1 tablet (has no administration in time range)  albuterol (PROVENTIL) (2.5 MG/3ML) 0.083% nebulizer solution 3 mL (has no administration in time range)  acetaminophen (TYLENOL) tablet 650 mg (has no administration in time range)  ondansetron (ZOFRAN) injection 4 mg (has no administration in time range)  0.9 %  sodium chloride infusion ( Intravenous New Bag/Given 12/23/22 2350)  nitroGLYCERIN (NITROSTAT) SL tablet 0.4 mg (has no administration in time range)  traZODone (DESYREL) tablet 25 mg (has no administration in time range)  magnesium hydroxide (MILK OF MAGNESIA) suspension 30 mL (has no administration in time range)  morphine (PF) 2 MG/ML injection 2 mg (2 mg Intravenous Not Given 12/23/22 2226)  rosuvastatin (CRESTOR) tablet 20 mg (has no administration in time range)  furosemide (LASIX) injection 20 mg (has no administration in time range)  heparin ADULT infusion 100 units/mL (25000 units/239mL) (has no administration in time range)  aspirin tablet 325 mg (325 mg Oral Given 12/23/22 1919)     IMPRESSION / MDM / ASSESSMENT AND PLAN / ED COURSE  I reviewed the triage vital signs and the nursing notes.  Differential diagnosis includes, but is not limited to, ACS, exacerbation of atypical chest pain, anemia, electrolyte abnormality, pneumonia, pneumothorax  Patient's presentation is most consistent with acute presentation with potential  threat to life or bodily function.  82 year old female presenting to the emergency department for evaluation of chest pain.  Initial labs in triage notable for slightly elevated troponin at 33.  Her initial EKG demonstrates A-fib at a rate of 120.  Patient without known history of this.  At the time of my initial bedside evaluation, patient did appear to be in sinus rhythm on the monitor with heart rates in the 70s.  Possible elevated troponin secondary to demand, though repeat troponin did increase to 77.  Ordered for 324 of aspirin.  No recent provocative cardiac testing.  Do think she is appropriate for admission for further evaluation of her chest pain.  Will reach out to hospitalist team.  Case reviewed with hospitalist team.  They will evaluate the patient for anticipated admission.      FINAL CLINICAL IMPRESSION(S) / ED DIAGNOSES   Final diagnoses:  Acute chest pain  Atrial fibrillation, unspecified  type [facility])     Rx / DC Orders   ED Discharge Orders     None        Note:  This document was prepared using Dragon voice recognition software and may include unintentional dictation errors.   Trinna Post, MD 12/23/22 2352

## 2022-12-23 NOTE — Assessment & Plan Note (Addendum)
-   She spontaneously converted to normal sinus rhythm. - CHA2DS2-VASc score is 4. - Elevated troponin could be related to her A-fib with RVR. - We will continue Lopressor. - She has suspected mild interstitial pulmonary edema without obvious clinical symptoms of CHF. - Will also add BNP. - 2D echo will be obtained as mentioned above and cardiology consult. - Since she had a very brief episode that spontaneously resolved I will defer anticoagulation for now to the cardiologist.

## 2022-12-23 NOTE — H&P (Signed)
[place]   PATIENT NAME: Cheryl Velez    MR#:  [id number]  DATE OF BIRTH:  [birth year]  DATE OF ADMISSION:  12/23/2022  PRIMARY CARE PHYSICIAN: Laurann Montana, MD   Patient is coming from: Home  REQUESTING/REFERRING PHYSICIAN: Trinna Post, MD  CHIEF COMPLAINT:   Chief Complaint  Patient presents with   Chest Pain    HISTORY OF PRESENT ILLNESS:  Cheryl Velez is a 82 y.o. Caucasian female with medical history significant for hypertension, dyslipidemia, hypothyroidism, OSA, who presented to the emergency room with acute onset of midsternal chest pain felt as pressure and burning that lasted about 45 minutes earlier today with associated nausea and dyspnea and possibly mild palpitations.  She denied any cough or wheezing or hemoptysis.  No leg pain or edema or recent travels or surgeries.  No melena or bright red bleeding per rectum.  No other bleeding diathesis.  She felt hot but did not have any measured fever.  No dysuria, oliguria or hematuria or flank pain.  ED Course: When she came to the ER, BP was 148/112 and heart rate 109 with otherwise normal vital signs.  Labs revealed a glucose of 142 with otherwise normal BMP.  High sensitive troponin I was 33 and later 77.  CBC was within normal. EKG as reviewed by me :  Initial EKG showed atrial fibrillation with rapid ventricular sponsor 120 with T wave inversion laterally.EKG showed normal sinus rhythm with a rate of 72 with poor R wave progression with T wave inversion anterolaterally. Imaging: Two-view chest x-ray showed mild diffuse bilateral interstitial pulmonary opacity consistent with edema or atypical/viral infection with no focal airspace opacity.  The patient was given 325 mg p.o. aspirin.  She will be admitted to an observation cardiac telemetry bed for further evaluation and management. PAST MEDICAL HISTORY:   Past Medical History:  Diagnosis Date   Adenomatous colon polyp    Arthritis    Chest pain 01/06/2013   Chest pain     GI (gastrointestinal bleed)    DR. Bosie Clos, ADENOMATOUS POLYP 6/14, 3 YEAR FOLLOW UP, 9/17, 3 YEAR FOLLOW UP.   Hearing loss    BIATERAL HEARING AIDS, DR. Memory Argue   Hypercholesterolemia    Hyperlipidemia    Hypertension    DX 8/16   Hypothyroidism    OSA (obstructive sleep apnea)    (PSG 03/21/12 ESS 6, AHI 14/hr REM 44/hr, RDI SAME, O2 min 77% APAP STARTED 12/15), DR. Earl Gala   Osteoarthritis    KNEES, HANDS HAS SEEN [name]   PTSD (post-traumatic stress disorder)    Thyroid disease     PAST SURGICAL HISTORY:   Past Surgical History:  Procedure Laterality Date   CARDIAC CATHETERIZATION     KNEE SURGERY     LEFT HEART CATH AND CORONARY ANGIOGRAPHY N/A 06/23/2017   Procedure: LEFT HEART CATH AND CORONARY ANGIOGRAPHY;  Surgeon: Tonny Bollman, MD;  Location: [facility] INVASIVE CV LAB;  Service: Cardiovascular;  Laterality: N/A;    SOCIAL HISTORY:   Social History   Tobacco Use   Smoking status: Never   Smokeless tobacco: Never  Substance Use Topics   Alcohol use: Yes    Comment: occassional glass of wine    FAMILY HISTORY:   Family History  Problem Relation Age of Onset   Stroke Mother    Hypertension Mother    Colon polyps Mother    Hypertension Father    CAD Father    Cirrhosis Father  Suicidality Sister    Hypertension Sister    Obesity Sister    Diverticulitis Sister    Hypertension Sister    Gout Sister    Alcohol abuse Sister    Polycythemia Sister    COPD Sister    Hypertension Brother 90   Heart attack Brother        Sudden cardiac death   Heart Problems Brother 50   Diabetes Brother 59   Hypertension Brother    Heart Problems Brother    Other Brother        MURDERED   Hypertension Brother    CVA Brother    Blindness Brother    CAD Maternal Grandmother    CAD Maternal Grandfather    Breast cancer Paternal Grandmother    CAD Paternal Grandmother    CAD Paternal Grandfather     DRUG ALLERGIES:   Allergies  Allergen Reactions    Lipitor [Atorvastatin] Other (See Comments)    MUSCLE CRAMPS/FATIGUE   Paxil [Paroxetine Hcl] Other (See Comments)    ZOMBIE LIKE EFFECT   Shellfish Allergy Hives, Itching and Rash   Latex Itching and Rash   Nickel Itching and Rash   Other Itching    "unknown pain medication" given benadryl to stop reaction     REVIEW OF SYSTEMS:   ROS As per history of present illness. All pertinent systems were reviewed above. Constitutional, HEENT, cardiovascular, respiratory, GI, GU, musculoskeletal, neuro, psychiatric, endocrine, integumentary and hematologic systems were reviewed and are otherwise negative/unremarkable except for positive findings mentioned above in the HPI.   MEDICATIONS AT HOME:   Prior to Admission medications   Medication Sig Start Date End Date Taking? Authorizing Provider  albuterol (VENTOLIN HFA) 108 (90 Base) MCG/ACT inhaler Inhale 1-2 puffs into the lungs every 6 (six) hours as needed. 10/20/19   [provider]  b complex vitamins tablet Take 1 tablet by mouth at bedtime. SUPER B COMPLEX    [provider]  Calcium Carb-Cholecalciferol (CALCIUM 600+D3 PO) Take 1 tablet by mouth at bedtime.    [provider]  Cholecalciferol (VITAMIN D3) 2000 units TABS Take 2,000 Units by mouth at bedtime.    [provider]  cholestyramine light (PREVALITE) 4 g packet Take 4 g by mouth 2 (two) times daily.    [provider]  fenofibrate 160 MG tablet Take 160 mg by mouth at bedtime.     [provider]  hyoscyamine ([name]) 0.125 MG tablet Take 0.125 mg by mouth every 4 (four) hours as needed for cramping.    [provider]  isosorbide mononitrate (IMDUR) 30 MG 24 hr tablet TAKE 1 TABLET DAILY 09/30/22   End, Cristal Deer, MD  levothyroxine (SYNTHROID, LEVOTHROID) 75 MCG tablet Take 75 mcg by mouth daily at 2 am.     [provider]  losartan (COZAAR) 50 MG tablet Take 1 tablet (50 mg total) by mouth at bedtime.  12/05/20   End, Cristal Deer, MD  metoprolol tartrate (LOPRESSOR) 25 MG tablet Take 0.5 tablets (12.5 mg total) by mouth 2 (two) times daily. 09/23/17   End, Cristal Deer, MD  Multiple Vitamin (MULTIVITAMIN WITH MINERALS) TABS tablet Take 1 tablet by mouth at bedtime.     [provider]  naproxen sodium (ANAPROX) 550 MG tablet Take 550 mg by mouth 2 (two) times daily as needed (FOR PAIN.).    [provider]  pantoprazole (PROTONIX) 40 MG tablet Take 40 mg by mouth daily.    [provider]  VITAL SIGNS:  Blood pressure (!) 154/69, pulse 71, temperature 98 F (36.7 C), temperature source Oral, resp. rate 17, height 5\' 4"  (1.626 m), weight 87.1 kg, SpO2 100%.  PHYSICAL EXAMINATION:  Physical Exam  GENERAL:  82 y.o.-year-old Caucasian female patient lying in the bed with no acute distress.  EYES: Pupils equal, round, reactive to light and accommodation. No scleral icterus. Extraocular muscles intact.  HEENT: Head atraumatic, normocephalic. Oropharynx and nasopharynx clear.  NECK:  Supple, no jugular venous distention. No thyroid enlargement, no tenderness.  LUNGS: Normal breath sounds bilaterally, no wheezing, rales,rhonchi or crepitation. No use of accessory muscles of respiration.  CARDIOVASCULAR: Regular rate and rhythm, S1, S2 normal. No murmurs, rubs, or gallops.  ABDOMEN: Soft, nondistended, nontender. Bowel sounds present. No organomegaly or mass.  EXTREMITIES: No pedal edema, cyanosis, or clubbing.  NEUROLOGIC: Cranial nerves II through XII are intact. Muscle strength 5/5 in all extremities. Sensation intact. Gait not checked.  PSYCHIATRIC: The patient is alert and oriented x 3.  Normal affect and good eye contact. SKIN: No obvious rash, lesion, or ulcer.   LABORATORY PANEL:   CBC Recent Labs  Lab 12/23/22 1538  WBC 7.8  HGB 13.1  HCT 40.1  PLT 258    ------------------------------------------------------------------------------------------------------------------  Chemistries  Recent Labs  Lab 12/23/22 1538  NA 138  K 4.1  CL 107  CO2 22  GLUCOSE 142*  BUN 22  CREATININE 0.93  CALCIUM 9.9   ------------------------------------------------------------------------------------------------------------------  Cardiac Enzymes No results for input(s): "TROPONINI" in the last 168 hours. ------------------------------------------------------------------------------------------------------------------  RADIOLOGY:  DG Chest 2 View  Result Date: 12/23/2022 CLINICAL DATA:  Chest pain EXAM: CHEST - 2 VIEW COMPARISON:  Seven hundred twenty 1,022 FINDINGS: The heart size and mediastinal contours are within normal limits. Mild diffuse bilateral interstitial pulmonary opacity. The visualized skeletal structures are unremarkable. IMPRESSION: Mild diffuse bilateral interstitial pulmonary opacity, consistent with edema or atypical/viral infection. No focal airspace opacity. Electronically Signed   By: Jearld Lesch M.D.   On: 12/23/2022 16:43      IMPRESSION AND PLAN:  Assessment and Plan: * Chest pain - The patient will be admitted to an observation cardiac telemetry bed. - Will follow serial troponins. - At this time she had a gap from 33-77 but denies any recurrent chest pain. -She  will be placed on aspirin as well as as needed sublingual nitroglycerin and IV morphine sulfate for pain. - Cardiology consult will be obtained. - 2D echo will be obtained. - I notified [facility] group about the patient.  Paroxysmal atrial fibrillation with RVR (HCC) - She spontaneously converted to normal sinus rhythm. - CHA2DS2-VASc score is 4. - Elevated troponin could be related to her A-fib with RVR. - We will continue Lopressor. - She has suspected mild interstitial pulmonary edema without obvious clinical symptoms of CHF. - Will also add BNP. - 2D  echo will be obtained as mentioned above and cardiology consult. - Since she had a very brief episode that spontaneously resolved I will defer anticoagulation for now to the cardiologist.  Hypothyroidism - We will continue Synthroid. - Will check TSH.  Hypertriglyceridemia - We will continue fenofibrate.  GERD without esophagitis - We will continue PPI therapy.  Essential hypertension - We will continue his antihypertensive therapy.       DVT prophylaxis: Lovenox.  Advanced Care Planning:  Code Status: full code.  Family Communication:  The plan of care was discussed in details with the patient (and family). I answered all questions. The  patient agreed to proceed with the above mentioned plan. Further management will depend upon hospital course. Disposition Plan: Back to previous home environment Consults called: Cardiology All the records are reviewed and case discussed with ED provider.  Status is: Observation  I certify that at the time of admission, it is my clinical judgment that the patient will require hospital care extending less than 2 midnights.                            Dispo: The patient is from: Home              Anticipated d/c is to: Home              Patient currently is not medically stable to d/c.              Difficult to place patient: No  Hannah Beat M.D on 12/23/2022 at 10:10 PM  [facility]   From 7 PM-7 AM, contact night-coverage [email or web address]  CC: Primary care physician; Laurann Montana, MD

## 2022-12-23 NOTE — Assessment & Plan Note (Signed)
-   We will continue PPI therapy 

## 2022-12-23 NOTE — Progress Notes (Signed)
Called for critical troponin of 189.  The patient could be ruling in ACS.  She was started on IV heparin.  Will add D-dimer.  We added Crestor.  She will have a 2D echo and cardiology consult as planned.

## 2022-12-23 NOTE — ED Triage Notes (Addendum)
Pt reports chest tightness, unsure if indigestion, started today. +shob. +nausea Reports recent stress with family member sick, crying in triage

## 2022-12-23 NOTE — Assessment & Plan Note (Addendum)
-   The patient will be admitted to an observation cardiac telemetry bed. - Will follow serial troponins. - At this time she had a gap from 33-77 but denies any recurrent chest pain. -She  will be placed on aspirin as well as as needed sublingual nitroglycerin and IV morphine sulfate for pain. - Cardiology consult will be obtained. - 2D echo will be obtained. - I notified [facility] group about the patient.

## 2022-12-23 NOTE — Assessment & Plan Note (Signed)
Triglycerides are normal - Continue fenofibrate

## 2022-12-23 NOTE — Assessment & Plan Note (Signed)
-   We will continue Synthroid. - Will check TSH.

## 2022-12-23 NOTE — Assessment & Plan Note (Signed)
-   We will continue his antihypertensive therapy. 

## 2022-12-23 NOTE — Progress Notes (Signed)
ANTICOAGULATION CONSULT NOTE - Initial Consult  Pharmacy Consult for Heparin  Indication: chest pain/ACS  Allergies  Allergen Reactions   Lipitor [Atorvastatin] Other (See Comments)    MUSCLE CRAMPS/FATIGUE   Paxil [Paroxetine Hcl] Other (See Comments)    ZOMBIE LIKE EFFECT   Shellfish Allergy Hives, Itching and Rash   Latex Itching and Rash   Nickel Itching and Rash   Other Itching    "unknown pain medication" given benadryl to stop reaction     Patient Measurements: Height: 5\' 4"  (162.6 cm) Weight: 88.7 kg (195 lb 8 oz) IBW/kg (Calculated) : 54.7 Heparin Dosing Weight: 74 kg   Vital Signs: Temp: 97.7 F (36.5 C) (08/27 2227) Temp Source: Oral (08/27 2227) BP: 147/62 (08/27 2227) Pulse Rate: 63 (08/27 2227)  Labs: Recent Labs    12/23/22 1538 12/23/22 1814 12/23/22 2225  HGB 13.1  --   --   HCT 40.1  --   --   PLT 258  --   --   CREATININE 0.93  --   --   TROPONINIHS 33* 77* 189*    Estimated Creatinine Clearance: 52 mL/min (by C-G formula based on SCr of 0.93 mg/dL).   Medical History: Past Medical History:  Diagnosis Date   Adenomatous colon polyp    Arthritis    Chest pain 01/06/2013   Chest pain    GI (gastrointestinal bleed)    DR. Bosie Clos, ADENOMATOUS POLYP 6/14, 3 YEAR FOLLOW UP, 9/17, 3 YEAR FOLLOW UP.   Hearing loss    BIATERAL HEARING AIDS, DR. Memory Argue   Hypercholesterolemia    Hyperlipidemia    Hypertension    DX 8/16   Hypothyroidism    OSA (obstructive sleep apnea)    (PSG 03/21/12 ESS 6, AHI 14/hr REM 44/hr, RDI SAME, O2 min 77% APAP STARTED 12/15), DR. Earl Gala   Osteoarthritis    KNEES, HANDS HAS SEEN [name]   PTSD (post-traumatic stress disorder)    Thyroid disease     Medications:  Medications Prior to Admission  Medication Sig Dispense Refill Last Dose   albuterol (VENTOLIN HFA) 108 (90 Base) MCG/ACT inhaler Inhale 1-2 puffs into the lungs every 6 (six) hours as needed.      b complex vitamins tablet Take 1 tablet by  mouth at bedtime. SUPER B COMPLEX      Calcium Carb-Cholecalciferol (CALCIUM 600+D3 PO) Take 1 tablet by mouth at bedtime.      Cholecalciferol (VITAMIN D3) 2000 units TABS Take 2,000 Units by mouth at bedtime.      cholestyramine light (PREVALITE) 4 g packet Take 4 g by mouth 2 (two) times daily.      fenofibrate 160 MG tablet Take 160 mg by mouth at bedtime.       hyoscyamine ([name]) 0.125 MG tablet Take 0.125 mg by mouth every 4 (four) hours as needed for cramping.      isosorbide mononitrate (IMDUR) 30 MG 24 hr tablet TAKE 1 TABLET DAILY 90 tablet 3    levothyroxine (SYNTHROID, LEVOTHROID) 75 MCG tablet Take 75 mcg by mouth daily at 2 am.       losartan (COZAAR) 50 MG tablet Take 1 tablet (50 mg total) by mouth at bedtime. 90 tablet 3    metoprolol tartrate (LOPRESSOR) 25 MG tablet Take 0.5 tablets (12.5 mg total) by mouth 2 (two) times daily. 90 tablet 2    Multiple Vitamin (MULTIVITAMIN WITH MINERALS) TABS tablet Take 1 tablet by mouth at bedtime.  naproxen sodium (ANAPROX) 550 MG tablet Take 550 mg by mouth 2 (two) times daily as needed (FOR PAIN.).      pantoprazole (PROTONIX) 40 MG tablet Take 40 mg by mouth daily.       Assessment: Pharmacy consulted to dose heparin in this 82 year old female with ACS/NSTEMI.  Pt had lovenox 40 mg SQ X 1 on 8/27 @ 2231. CrCl = 52 ml/min   Goal of Therapy:  Heparin level 0.3-0.7 units/ml Monitor platelets by anticoagulation protocol: Yes   Plan:  Pt had lovenox 40 mg SQ X 1 on 8/27 @ 2231 so will not bolus heparin Start heparin infusion at 1000 units/hr Check anti-Xa level in 8 hours and daily while on heparin Continue to monitor H&H and platelets  Marisela Line D 12/23/2022,11:32 PM

## 2022-12-24 ENCOUNTER — Observation Stay (HOSPITAL_BASED_OUTPATIENT_CLINIC_OR_DEPARTMENT_OTHER)
Admit: 2022-12-24 | Discharge: 2022-12-24 | Disposition: A | Discharge status: Home / Self Care | Payer: Medicare Other | Attending: Family Medicine | Admitting: Family Medicine

## 2022-12-24 ENCOUNTER — Other Ambulatory Visit (HOSPITAL_COMMUNITY): Payer: Self-pay

## 2022-12-24 DIAGNOSIS — E785 Hyperlipidemia, unspecified: Secondary | ICD-10-CM

## 2022-12-24 DIAGNOSIS — I480 Paroxysmal atrial fibrillation: Secondary | ICD-10-CM | POA: Diagnosis not present

## 2022-12-24 DIAGNOSIS — R0789 Other chest pain: Secondary | ICD-10-CM | POA: Diagnosis not present

## 2022-12-24 DIAGNOSIS — I2489 Other forms of acute ischemic heart disease: Secondary | ICD-10-CM

## 2022-12-24 DIAGNOSIS — E039 Hypothyroidism, unspecified: Secondary | ICD-10-CM | POA: Diagnosis not present

## 2022-12-24 DIAGNOSIS — I429 Cardiomyopathy, unspecified: Secondary | ICD-10-CM | POA: Diagnosis not present

## 2022-12-24 DIAGNOSIS — I10 Essential (primary) hypertension: Secondary | ICD-10-CM | POA: Diagnosis not present

## 2022-12-24 DIAGNOSIS — I517 Cardiomegaly: Secondary | ICD-10-CM | POA: Diagnosis not present

## 2022-12-24 DIAGNOSIS — R7989 Other specified abnormal findings of blood chemistry: Secondary | ICD-10-CM | POA: Diagnosis not present

## 2022-12-24 DIAGNOSIS — I4891 Unspecified atrial fibrillation: Secondary | ICD-10-CM

## 2022-12-24 DIAGNOSIS — I420 Dilated cardiomyopathy: Secondary | ICD-10-CM

## 2022-12-24 DIAGNOSIS — R079 Chest pain, unspecified: Secondary | ICD-10-CM | POA: Diagnosis not present

## 2022-12-24 LAB — ECHOCARDIOGRAM COMPLETE
AR max vel: 1.98 cm2
AV Area VTI: 2.06 cm2
AV Area mean vel: 1.96 cm2
AV Mean grad: 4.3 mmHg
AV Peak grad: 7.8 mmHg
Ao pk vel: 1.39 m/s
Area-P 1/2: 4.99 cm2
Height: 64 in
MV VTI: 2.29 cm2
S' Lateral: 2.8 cm
Weight: 3128 [oz_av]

## 2022-12-24 LAB — BASIC METABOLIC PANEL
Anion gap: 8 (ref 5–15)
BUN: 16 mg/dL (ref 8–23)
CO2: 21 mmol/L — ABNORMAL LOW (ref 22–32)
Calcium: 8.8 mg/dL — ABNORMAL LOW (ref 8.9–10.3)
Chloride: 110 mmol/L (ref 98–111)
Creatinine, Ser: 0.69 mg/dL (ref 0.44–1.00)
GFR, Estimated: >60 mL/min (ref >60)
Glucose, Bld: 107 mg/dL — ABNORMAL HIGH (ref 70–99)
Potassium: 3.8 mmol/L (ref 3.5–5.1)
Sodium: 139 mmol/L (ref 135–145)

## 2022-12-24 LAB — TROPONIN I (HIGH SENSITIVITY): Troponin I (High Sensitivity): 145 ng/L (ref <18)

## 2022-12-24 LAB — LIPID PANEL
Cholesterol: 137 mg/dL (ref 0–200)
HDL: 34 mg/dL — ABNORMAL LOW (ref >40)
LDL Cholesterol: 82 mg/dL (ref 0–99)
Total CHOL/HDL Ratio: 4 RATIO
Triglycerides: 105 mg/dL (ref <150)
VLDL: 21 mg/dL (ref 0–40)

## 2022-12-24 LAB — HEPARIN LEVEL (UNFRACTIONATED): Heparin Unfractionated: 0.58 [IU]/mL (ref 0.30–0.70)

## 2022-12-24 MED ORDER — METOPROLOL TARTRATE 25 MG PO TABS
12.5000 mg | ORAL_TABLET | Freq: Once | ORAL | Status: AC
  Administered 2022-12-24: 12.5 mg via ORAL
  Filled 2022-12-24: qty 1

## 2022-12-24 MED ORDER — APIXABAN 5 MG PO TABS: 5.0000 mg | ORAL_TABLET | Freq: Two times a day (BID) | ORAL | 2 refills | Status: DC

## 2022-12-24 MED ORDER — METOPROLOL TARTRATE 25 MG PO TABS: 25.0000 mg | ORAL_TABLET | Freq: Two times a day (BID) | ORAL | 1 refills | Status: DC

## 2022-12-24 MED ORDER — APIXABAN 5 MG PO TABS
5.0000 mg | ORAL_TABLET | Freq: Two times a day (BID) | ORAL | Status: DC
  Administered 2022-12-24: 5 mg via ORAL
  Filled 2022-12-24: qty 1

## 2022-12-24 MED ORDER — METOPROLOL TARTRATE 25 MG PO TABS: 25.0000 mg | ORAL_TABLET | Freq: Two times a day (BID) | ORAL | Status: DC

## 2022-12-24 NOTE — Consult Note (Signed)
Cardiology Consultation:   Patient ID: Cheryl Velez; [id number]; 1941/01/13   Admit date: 12/23/2022 Date of Consult: 12/24/2022  Primary Care Provider: Laurann Montana, MD Primary Cardiologist: End Primary Electrophysiologist:  None   Patient Profile:   Cheryl Velez is a 82 y.o. female with a hx of apical hypertrophic cardiomyopathy, HTN, HLD with high intensity statin intolerance, obstructive sleep apnea, hypothyroidism, osteoarthritis, PTSD, and GERD who is being seen today for the evaluation of new onset Afib with RVR and elevated high sensitivity troponin at the request of Dr. Arville Care.  History of Present Illness:   Ms. Congrove underwent nuclear stress test in 12/2012 showed no evidence of reversible/inducible ischemia with an EF of 61%.  LHC in 05/2017 showed widely patent coronary arteries with minor nonobstructive CAD.  There was vigorous LV systolic function with an EF greater than 65% and apical hypertrophy noted.  Medical management was advised.  Echo in 06/2017 showed an EF of 55 to 60%, moderate concentric LVH, no regional wall motion abnormalities, grade 2 diastolic dysfunction, trivial aortic insufficiency, trivial mitral regurgitation, mildly dilated left atrium, normal RV systolic function and ventricular cavity size, and an estimated PASP of 31 mmHg.  Holter monitor in 07/2017 showed a predominant rhythm of sinus with an average rate of 64 (range 49 to 106 bpm), rare PACs and PVCs, and no sustained arrhythmias or prolonged pauses.  She was seen in the ED in 2022 with chest tightness that improved with resumption of famotidine and preferred to defer further testing at that time given resolution of symptoms.  She was last seen in the office in 07/2022 and was without symptoms of angina or cardiac decompensation.   She presented to [facility] on 12/23/2022 with intermittent episodes of chest tightness and palpitations as well as onset of midsternal chest pressure with associated palpitations, dyspnea, and  nausea lasting for approximately 45 minutes.  She had recently been to see a family member who is in poor health.  She was found to be in new onset A-fib with RVR with ventricular rates in the 120s bpm.  Blood pressure stable in the 130s to 150s systolic.  Afebrile.  Oxygen saturation 100% on room air.  Labs notable for initial high-sensitivity troponin 33 with a delta troponin 77, peaking at 189, currently downtrending.  BNP 598.  Potassium 4.1, renal function normal, unremarkable CBC, and normal TSH.  Chest x-ray with mild diffuse bilateral interstitial pulmonary opacities consistent with edema versus atypical/viral infection.  She spontaneously converted to sinus rhythm in the ED.  In the ED, she received ASA 325 mg x 1, morphine, Lopressor 12.5 mg, and IV fluids.  Upon admission, she was placed on IV fluids and IV Lasix.  She has been placed on a heparin drip. She remains in sinus rhythm with rates in the 60s to 70s bpm. Currently, without symptoms of angina or cardiac decompensation.    Past Medical History:  Diagnosis Date   Adenomatous colon polyp    Arthritis    Chest pain 01/06/2013   Chest pain    GI (gastrointestinal bleed)    DR. Bosie Clos, ADENOMATOUS POLYP 6/14, 3 YEAR FOLLOW UP, 9/17, 3 YEAR FOLLOW UP.   Hearing loss    BIATERAL HEARING AIDS, DR. Memory Argue   Hypercholesterolemia    Hyperlipidemia    Hypertension    DX 8/16   Hypothyroidism    OSA (obstructive sleep apnea)    (PSG 03/21/12 ESS 6, AHI 14/hr REM 44/hr, RDI SAME, O2 min 77%  APAP STARTED 12/15), DR. Earl Gala   Osteoarthritis    KNEES, HANDS HAS SEEN [name]   PTSD (post-traumatic stress disorder)    Thyroid disease     Past Surgical History:  Procedure Laterality Date   CARDIAC CATHETERIZATION     KNEE SURGERY     LEFT HEART CATH AND CORONARY ANGIOGRAPHY N/A 06/23/2017   Procedure: LEFT HEART CATH AND CORONARY ANGIOGRAPHY;  Surgeon: Tonny Bollman, MD;  Location: [facility] INVASIVE CV LAB;  Service: Cardiovascular;   Laterality: N/A;     Home Meds: Prior to Admission medications   Medication Sig Start Date End Date Taking? Authorizing Provider  b complex vitamins tablet Take 1 tablet by mouth at bedtime. SUPER B COMPLEX   Yes [provider]  Calcium Carb-Cholecalciferol (CALCIUM 600+D3 PO) Take 1 tablet by mouth at bedtime.   Yes [provider]  Cholecalciferol (VITAMIN D3) 2000 units TABS Take 2,000 Units by mouth at bedtime.   Yes [provider]  cholestyramine light (PREVALITE) 4 g packet Take 4 g by mouth 2 (two) times daily.   Yes [provider]  fenofibrate 160 MG tablet Take 160 mg by mouth at bedtime.    Yes [provider]  isosorbide mononitrate (IMDUR) 30 MG 24 hr tablet TAKE 1 TABLET DAILY 09/30/22  Yes End, Cristal Deer, MD  levothyroxine (SYNTHROID, LEVOTHROID) 75 MCG tablet Take 75 mcg by mouth daily at 2 am.    Yes [provider]  losartan (COZAAR) 50 MG tablet Take 1 tablet (50 mg total) by mouth at bedtime. 12/05/20  Yes End, Cristal Deer, MD  metoprolol tartrate (LOPRESSOR) 25 MG tablet Take 0.5 tablets (12.5 mg total) by mouth 2 (two) times daily. 09/23/17  Yes End, Cristal Deer, MD  Multiple Vitamin (MULTIVITAMIN WITH MINERALS) TABS tablet Take 1 tablet by mouth at bedtime.    Yes [provider]  naproxen sodium (ANAPROX) 550 MG tablet Take 550 mg by mouth 2 (two) times daily as needed (FOR PAIN.).   Yes [provider]  pantoprazole (PROTONIX) 40 MG tablet Take 40 mg by mouth daily.   Yes [provider]  albuterol (VENTOLIN HFA) 108 (90 Base) MCG/ACT inhaler Inhale 1-2 puffs into the lungs every 6 (six) hours as needed. Patient not taking: Reported on 12/24/2022 10/20/19   [provider]  hyoscyamine ([name]) 0.125 MG tablet Take 0.125 mg by mouth every 4 (four) hours as needed for cramping.    [provider]    Inpatient Medications: Scheduled Meds:  calcium-vitamin D  1 tablet Oral  QHS   cholecalciferol  2,000 Units Oral QHS   cholestyramine light  4 g Oral BID   fenofibrate  160 mg Oral QHS   furosemide  20 mg Intravenous Q12H   isosorbide mononitrate  30 mg Oral Daily   levothyroxine  75 mcg Oral Q0600   losartan  50 mg Oral QHS   metoprolol tartrate  12.5 mg Oral BID   multivitamin  1 tablet Oral QHS   multivitamin with minerals  1 tablet Oral QHS   pantoprazole  40 mg Oral Daily   rosuvastatin  20 mg Oral Daily   Continuous Infusions:  sodium chloride 100 mL/hr at 12/24/22 0300   heparin 1,000 Units/hr (12/24/22 0300)   PRN Meds: acetaminophen, albuterol, hyoscyamine, magnesium hydroxide, morphine injection, nitroGLYCERIN, ondansetron (ZOFRAN) IV, traZODone  Allergies:   Allergies  Allergen Reactions   Lipitor [Atorvastatin] Other (See Comments)    MUSCLE CRAMPS/FATIGUE   Paxil [Paroxetine Hcl]  Other (See Comments)    ZOMBIE LIKE EFFECT   Shellfish Allergy Hives, Itching and Rash   Latex Itching and Rash   Nickel Itching and Rash   Other Itching    "unknown pain medication" given benadryl to stop reaction     Social History:   Social History   Socioeconomic History   Marital status: Married    Spouse name: Hydrologist   Number of children: 3   Years of education: Not on file   Highest education level: Not on file  Occupational History   Occupation: RETIRED  Tobacco Use   Smoking status: Never   Smokeless tobacco: Never  Vaping Use   Vaping status: Never Used  Substance and Sexual Activity   Alcohol use: Yes    Comment: occassional glass of wine   Drug use: No   Sexual activity: Not on file  Other Topics Concern   Not on file  Social History Narrative   Not on file   Social Determinants of Health   Financial Resource Strain: Not on file  Food Insecurity: No Food Insecurity (12/23/2022)   Hunger Vital Sign    Worried About Running Out of Food in the Last Year: Never true    Ran Out of Food in the Last Year: Never true   Transportation Needs: No Transportation Needs (12/23/2022)   PRAPARE - Administrator, Civil Service (Medical): No    Lack of Transportation (Non-Medical): No  Physical Activity: Not on file  Stress: Not on file  Social Connections: Not on file  Intimate Partner Violence: Unknown (12/23/2022)   Humiliation, Afraid, Rape, and Kick questionnaire    Fear of Current or Ex-Partner: No    Emotionally Abused: No    Physically Abused: No    Sexually Abused: Not on file     Family History:   Family History  Problem Relation Age of Onset   Stroke Mother    Hypertension Mother    Colon polyps Mother    Hypertension Father    CAD Father    Cirrhosis Father    Suicidality Sister    Hypertension Sister    Obesity Sister    Diverticulitis Sister    Hypertension Sister    Gout Sister    Alcohol abuse Sister    Polycythemia Sister    COPD Sister    Hypertension Brother 96   Heart attack Brother        Sudden cardiac death   Heart Problems Brother 40   Diabetes Brother 6   Hypertension Brother    Heart Problems Brother    Other Brother        MURDERED   Hypertension Brother    CVA Brother    Blindness Brother    CAD Maternal Grandmother    CAD Maternal Grandfather    Breast cancer Paternal Grandmother    CAD Paternal Grandmother    CAD Paternal Grandfather     ROS:  Review of Systems  Constitutional:  Positive for malaise/fatigue. Negative for chills, diaphoresis, fever and weight loss.  HENT:  Negative for congestion.   Eyes:  Negative for discharge and redness.  Respiratory:  Negative for cough, sputum production, shortness of breath and wheezing.   Cardiovascular:  Positive for palpitations. Negative for chest pain, orthopnea, claudication, leg swelling and PND.       Chest tightness  Gastrointestinal:  Negative for abdominal pain, blood in stool, heartburn, melena, nausea and vomiting.  Musculoskeletal:  Negative for falls  and myalgias.  Skin:  Negative for  rash.  Neurological:  Negative for dizziness, tingling, tremors, sensory change, speech change, focal weakness, loss of consciousness and weakness.  Endo/Heme/Allergies:  Does not bruise/bleed easily.  Psychiatric/Behavioral:  Negative for substance abuse. The patient is not nervous/anxious.   All other systems reviewed and are negative.     Physical Exam/Data:   Vitals:   12/23/22 1900 12/23/22 1930 12/23/22 2227 12/24/22 0342  BP: 135/71 (!) 154/69 (!) 147/62 137/65  Pulse: 70 71 63 60  Resp: 18 17 16    Temp: 98 F (36.7 C)  97.7 F (36.5 C) 98 F (36.7 C)  TempSrc: Oral  Oral Oral  SpO2: 96% 100% 100%   Weight:   88.7 kg   Height:        Intake/Output Summary (Last 24 hours) at 12/24/2022 0736 Last data filed at 12/24/2022 0300 Gross per 24 hour  Intake 344.99 ml  Output 1000 ml  Net -655.01 ml   Filed Weights   12/23/22 1536 12/23/22 2227  Weight: 87.1 kg 88.7 kg   Body mass index is 33.56 kg/m.   Physical Exam: General: Well developed, well nourished, in no acute distress. Head: Normocephalic, atraumatic, sclera non-icteric, no xanthomas, nares without discharge.  Neck: Negative for carotid bruits. JVD not elevated. Lungs: Clear bilaterally to auscultation without wheezes, rales, or rhonchi. Breathing is unlabored. Heart: RRR with S1 S2. No murmurs, rubs, or gallops appreciated. Abdomen: Soft, non-tender, non-distended with normoactive bowel sounds. No hepatomegaly. No rebound/guarding. No obvious abdominal masses. Msk:  Strength and tone appear normal for age. Extremities: No clubbing or cyanosis. No edema. Distal pedal pulses are 2+ and equal bilaterally. Neuro: Alert and oriented X 3. No facial asymmetry. No focal deficit. Moves all extremities spontaneously. Psych:  Responds to questions appropriately with a normal affect.   EKG:  The EKG was personally reviewed and demonstrates: A-fib with RVR, 120 bpm, nonspecific ST-T changes.  Repeat EKG showed NSR, 72  bpm, abnormal R wave progression with early transition, and anterolateral T wave inversion consistent with prior tracings.  Telemetry:  Telemetry was personally reviewed and demonstrates: SR, 60s to 70s bpm  Weights: Filed Weights   12/23/22 1536 12/23/22 2227  Weight: 87.1 kg 88.7 kg    Relevant CV Studies:  Carotid artery ultrasound 02/20/2022: Summary:  Right Carotid: There is no evidence of stenosis in the right ICA.   Left Carotid: There is no evidence of stenosis in the left ICA.   Vertebrals:  Bilateral vertebral arteries demonstrate antegrade flow.  Subclavians: Normal flow hemodynamics were seen in bilateral subclavian arteries.  __________   48-hour Holter 07/2017: The patient was monitored for 48 hours. The predominant rhythm was sinus with an average rate of 64 bpm (range 49-106 bpm). The longest R-R interval was 1.4 seconds. Rare PAC's and PVC's were observed. No sustained arrhythmia or prolonged pause was seen. Patient diary events correspond to sinus rhythm.   Predominantly sinus rhythm with rare PAC's and PVC's.  No significant arrhythmias. __________   2D echo 07/06/2017: - Left ventricle: The cavity size was normal. There was moderate    concentric hypertrophy. Systolic function was normal. The    estimated ejection fraction was in the range of 55% to 60%. Wall    motion was normal; there were no regional wall motion    abnormalities. Features are consistent with a pseudonormal left    ventricular filling pattern, with concomitant abnormal relaxation    and increased filling pressure (  grade 2 diastolic dysfunction).    Doppler parameters are consistent with high ventricular filling    pressure.  - Aortic valve: Transvalvular velocity was within the normal range.    There was no stenosis. There was trivial regurgitation.  - Mitral valve: Transvalvular velocity was within the normal range.    There was no evidence for stenosis. There was trivial     regurgitation.  - Left atrium: The atrium was mildly dilated.  - Right ventricle: The cavity size was normal. Wall thickness was    normal. Systolic function was normal.  - Atrial septum: No defect or patent foramen ovale was identified    by color flow Doppler.  - Tricuspid valve: There was mild regurgitation.  - Pulmonary arteries: Systolic pressure was within the normal    range. PA peak pressure: 31 mm Hg (S). __________   LHC 06/23/2017: 1. Widely patent coronary arteries with minor nonobstructive CAD  2. Vigorous LV function with LVEF >65% and apical hypertrophy noted 3. Moderately elevated LVEDP   Recommend: medical management __________   Nuclear stress test 01/08/2013: 1.  No evidence of reversible/inducible ischemia.  2.  Normal ventricular wall motion.  3.  Calculated ejection fraction 61%.    Laboratory Data:  Chemistry Recent Labs  Lab 12/23/22 1538  NA 138  K 4.1  CL 107  CO2 22  GLUCOSE 142*  BUN 22  CREATININE 0.93  CALCIUM 9.9  GFRNONAA >60  ANIONGAP 9    No results for input(s): "PROT", "ALBUMIN", "AST", "ALT", "ALKPHOS", "BILITOT" in the last 168 hours. Hematology Recent Labs  Lab 12/23/22 1538  WBC 7.8  RBC 4.34  HGB 13.1  HCT 40.1  MCV 92.4  MCH 30.2  MCHC 32.7  RDW 12.7  PLT 258   Cardiac EnzymesNo results for input(s): "TROPONINI" in the last 168 hours. No results for input(s): "TROPIPOC" in the last 168 hours.  BNP Recent Labs  Lab 12/23/22 2225  BNP 598.4*    DDimer No results for input(s): "DDIMER" in the last 168 hours.  Radiology/Studies:  DG Chest 2 View  Result Date: 12/23/2022 IMPRESSION: Mild diffuse bilateral interstitial pulmonary opacity, consistent with edema or atypical/viral infection. No focal airspace opacity. Electronically Signed   By: Jearld Lesch M.D.   On: 12/23/2022 16:43    Assessment and Plan:   1. New onset A-fib with RVR:  -Status post spontaneous conversion to sinus rhythm in the ED   -Maintaining sinus rhythm -Titrate Lopressor to 25 mg bid  -Plan for outpatient cardiac monitor given history of palpitations to evaluate for burden (we will arrange through the office) -CHA2DS2-VASc at least 5 (HTN, age x 2, vascular disease, sex category)  -Transition from heparin gtt to Eliquis 5 mg bid (she does not meet reduced dosing criteria) -Ambulate   2. Elevated high-sensitivity troponin:  -Mildly elevated and flat trending, not consistent with ACS  -Likely secondary to supply/demand ischemia with known apical hypertrophic cardiomyopathy and in the setting of A-fib with RVR and mild volume overload  -Echo preliminary read with possible HK/thickened myocardium involving the apex (known apical HCM) vs stress-induced CM -Plan for outpatient coronary CTA  3. Apical hypertrophic cardiomyopathy:  -Diagnostic LHC in 2019 showed no evidence of obstructive coronary artery disease with minor luminal irregularities -No syncope -Euvolemic  -Was placed on IV fluids and IV Lasix at admission, stop both  4. HTN:  -Blood pressure currently reasonably controlled  -Titrated dose of metoprolol as above with lower dose losartan 50  mg and PTA Imdur 30 mg  5. HLD with high intensity statin intolerance:  -Obtain lipid panel  -Has been started on rosuvastatin at admission, patient with noted myalgias and fatigue on atorvastatin, monitor to adverse effect  6. GERD:  -Protonix      For questions or updates, please contact [facility] Please consult [email or web address] for contact info under Cardiology/STEMI.   Signed, Eula Listen, PA-C [facility] Pager: [phone] 12/24/2022, 7:36 AM

## 2022-12-24 NOTE — TOC Benefit Eligibility Note (Signed)
Patient Product/process development scientist completed.    The patient is insured through [employer].     Ran test claim for Eliquis 5 mg and the current 30 day co-pay is $43.00.   This test claim was processed through [facility]- copay amounts may vary at other pharmacies due to [employer], or as the patient moves through the different stages of their insurance plan.     Roland Earl, CPHT Pharmacy Technician III Certified Patient Advocate [facility] Patient Advocate Team Direct Number: [phone]  Fax: [phone]

## 2022-12-24 NOTE — TOC CM/SW Note (Signed)
Transition of Care [facility]) - Inpatient Brief Assessment   Patient Details  Name: LASHANA CROWN MRN: [id number] Date of Birth: [birth year]  Transition of Care [facility]) CM/SW Contact:    Margarito Liner, LCSW Phone Number: 12/24/2022, 12:56 PM   Clinical Narrative: Patient has orders to discharge home today. Chart reviewed. No TOC needs identified. CSW signing off.  Transition of Care Asessment: Insurance and Status: Insurance coverage has been reviewed Patient has primary care physician: Yes Home environment has been reviewed: Single family home Prior level of function:: Not documented Prior/Current Home Services: No current home services Social Determinants of Health Reivew: SDOH reviewed no interventions necessary Readmission risk has been reviewed: Yes Transition of care needs: no transition of care needs at this time

## 2022-12-24 NOTE — Progress Notes (Signed)
ANTICOAGULATION CONSULT NOTE - Initial Consult  Pharmacy Consult for Heparin  Indication: chest pain/ACS  Allergies  Allergen Reactions   Lipitor [Atorvastatin] Other (See Comments)    MUSCLE CRAMPS/FATIGUE   Paxil [Paroxetine Hcl] Other (See Comments)    ZOMBIE LIKE EFFECT   Shellfish Allergy Hives, Itching and Rash   Latex Itching and Rash   Nickel Itching and Rash   Other Itching    "unknown pain medication" given benadryl to stop reaction     Patient Measurements: Height: 5\' 4"  (162.6 cm) Weight: 88.7 kg (195 lb 8 oz) IBW/kg (Calculated) : 54.7 Heparin Dosing Weight: 74 kg   Vital Signs: Temp: 97.8 F (36.6 C) (08/28 0825) Temp Source: Oral (08/28 0342) BP: 137/61 (08/28 0825) Pulse Rate: 65 (08/28 0825)  Labs: Recent Labs    12/23/22 1538 12/23/22 1814 12/23/22 2225 12/24/22 0014 12/24/22 0836  HGB 13.1  --   --   --   --   HCT 40.1  --   --   --   --   PLT 258  --   --   --   --   HEPARINUNFRC  --   --   --   --  0.58  CREATININE 0.93  --   --   --   --   TROPONINIHS 33* 77* 189* 145*  --     Estimated Creatinine Clearance: 52 mL/min (by C-G formula based on SCr of 0.93 mg/dL).   Medical History: Past Medical History:  Diagnosis Date   Adenomatous colon polyp    Arthritis    Chest pain 01/06/2013   Chest pain    GI (gastrointestinal bleed)    DR. Bosie Clos, ADENOMATOUS POLYP 6/14, 3 YEAR FOLLOW UP, 9/17, 3 YEAR FOLLOW UP.   Hearing loss    BIATERAL HEARING AIDS, DR. Memory Argue   Hypercholesterolemia    Hyperlipidemia    Hypertension    DX 8/16   Hypothyroidism    OSA (obstructive sleep apnea)    (PSG 03/21/12 ESS 6, AHI 14/hr REM 44/hr, RDI SAME, O2 min 77% APAP STARTED 12/15), DR. Earl Gala   Osteoarthritis    KNEES, HANDS HAS SEEN [name]   PTSD (post-traumatic stress disorder)    Thyroid disease     Medications:  Medications Prior to Admission  Medication Sig Dispense Refill Last Dose   b complex vitamins tablet Take 1 tablet by mouth  at bedtime. SUPER B COMPLEX   12/23/2022   Calcium Carb-Cholecalciferol (CALCIUM 600+D3 PO) Take 1 tablet by mouth at bedtime.   12/23/2022   Cholecalciferol (VITAMIN D3) 2000 units TABS Take 2,000 Units by mouth at bedtime.   12/23/2022   cholestyramine light (PREVALITE) 4 g packet Take 4 g by mouth 2 (two) times daily.   12/23/2022   fenofibrate 160 MG tablet Take 160 mg by mouth at bedtime.    12/23/2022   isosorbide mononitrate (IMDUR) 30 MG 24 hr tablet TAKE 1 TABLET DAILY 90 tablet 3 12/23/2022   levothyroxine (SYNTHROID, LEVOTHROID) 75 MCG tablet Take 75 mcg by mouth daily at 2 am.    12/23/2022   losartan (COZAAR) 50 MG tablet Take 1 tablet (50 mg total) by mouth at bedtime. 90 tablet 3 12/23/2022   metoprolol tartrate (LOPRESSOR) 25 MG tablet Take 0.5 tablets (12.5 mg total) by mouth 2 (two) times daily. 90 tablet 2 12/23/2022   Multiple Vitamin (MULTIVITAMIN WITH MINERALS) TABS tablet Take 1 tablet by mouth at bedtime.    12/23/2022  naproxen sodium (ANAPROX) 550 MG tablet Take 550 mg by mouth 2 (two) times daily as needed (FOR PAIN.).   Past Week at PRN   pantoprazole (PROTONIX) 40 MG tablet Take 40 mg by mouth daily.   12/23/2022   albuterol (VENTOLIN HFA) 108 (90 Base) MCG/ACT inhaler Inhale 1-2 puffs into the lungs every 6 (six) hours as needed. (Patient not taking: Reported on 12/24/2022)   Not Taking   hyoscyamine ([name]) 0.125 MG tablet Take 0.125 mg by mouth every 4 (four) hours as needed for cramping.   PRN at PRN    Assessment: Pharmacy consulted to dose heparin in this 82 year old female with ACS/NSTEMI.   Serum creatinine: 0.93 mg/dL 78/29/56 2130 Estimated creatinine clearance: 52 mL/min  HL: 8/28 0836- 0.58, therapeutic  Goal of Therapy:  Heparin level 0.3-0.7 units/ml Monitor platelets by anticoagulation protocol: Yes   Plan:  HL therapeutic this morning Continue heparin infusion at 1000 units/hr Check anti-Xa level in 8 hours and daily while on heparin Continue to  monitor CBC daily  Merryl Hacker, PharmD Clinical Pharmacist 12/24/2022,9:34 AM

## 2022-12-24 NOTE — Discharge Summary (Signed)
Physician Discharge Summary   Patient: Cheryl Velez MRN: [id number] DOB: 02/13/1941  Admit date:     12/23/2022  Discharge date: 12/24/22  Discharge Physician: Arnetha Courser   PCP: Laurann Montana, MD   Recommendations at discharge:  Please obtain CBC and BMP in 1 week Follow-up with cardiology Follow-up with primary care provider  Discharge Diagnoses: Principal Problem:   Acute chest pain Active Problems:   Paroxysmal atrial fibrillation with RVR (HCC)   Hypothyroidism   Hypertriglyceridemia   Essential hypertension   GERD without esophagitis   Atrial fibrillation (HCC)   Demand ischemia   Elevated troponin   Dilated cardiomyopathy [facility])   Cardiac hypertrophy   Hospital Course: Taken from H&P.  Cheryl Velez is a 82 y.o. Caucasian female with medical history significant for hypertension, dyslipidemia, hypothyroidism, OSA, who presented to the emergency room with acute onset of midsternal chest pain felt as pressure and burning that lasted about 45 minutes earlier today with associated nausea and dyspnea and possibly mild palpitations.   On arrival BP was 148/112 and heart rate 109 with otherwise normal vital signs.  Mostly unremarkable labs, troponin 33>>77 EKG NSR, poor R wave progression and T wave inversion anterolaterally. CXR with mild diffuse bilateral interstitial pulmonary opacity consistent with edema or atypical/viral infection with no focal airspace opacity.  She was given full dose aspirin and also started on heparin infusion due to worsening troponin. Cardiology was consulted.  8/28: Vitals stable.  Troponin peaked at 189, TSH normal, BNP elevated at 598.  Echocardiogram with normal EF, concentric LVH, apical hypokinesis cannot be ruled out.  Cardiology evaluated her and they were recommending outpatient follow-up with possible CT coronary angiography.  Patient was found to be in A-fib with RVR in ED, spontaneously converted back to normal sinus rhythm and remained in  sinus since then.CHA2DS2-VASc at least 5 (HTN, age x 2, vascular disease, sex category), cardiology increased her home metoprolol to 25 mg twice daily and added Eliquis.  They will follow-up as an outpatient and she will likely get a ZIO monitor for further evaluation.  Patient is apparently unable to tolerate statin, gets myalgias and fatigue and refusing to go back on it.  His outpatient provider can order other cholesterol medications as appropriate.  Her chest pain completely resolved.  She was able to ambulate without any difficulty.  Patient is being discharged to continue rest of her home medications, should avoid NSAID to decrease the risk of bleeding with Eliquis.  Patient will follow-up with cardiology as outpatient for further recommendations.   Consultants: Cardiology Procedures performed: None Disposition: Home Diet recommendation:  Discharge Diet Orders (From admission, onward)     Start     Ordered   12/24/22 0000  Diet - low sodium heart healthy        12/24/22 1249           Cardiac diet DISCHARGE MEDICATION: Allergies as of 12/24/2022       Reactions   Lipitor [atorvastatin] Other (See Comments)   MUSCLE CRAMPS/FATIGUE   Paxil [paroxetine Hcl] Other (See Comments)   ZOMBIE LIKE EFFECT   Shellfish Allergy Hives, Itching, Rash   Latex Itching, Rash   Nickel Itching, Rash   Other Itching   "unknown pain medication" given benadryl to stop reaction         Medication List     STOP taking these medications    naproxen sodium 550 MG tablet Commonly known as: ANAPROX  TAKE these medications    albuterol 108 (90 Base) MCG/ACT inhaler Commonly known as: VENTOLIN HFA Inhale 1-2 puffs into the lungs every 6 (six) hours as needed.   apixaban 5 MG Tabs tablet Commonly known as: ELIQUIS Take 1 tablet (5 mg total) by mouth 2 (two) times daily.   b complex vitamins tablet Take 1 tablet by mouth at bedtime. SUPER B COMPLEX   CALCIUM 600+D3  PO Take 1 tablet by mouth at bedtime.   cholestyramine light 4 g packet Commonly known as: PREVALITE Take 4 g by mouth 2 (two) times daily.   fenofibrate 160 MG tablet Take 160 mg by mouth at bedtime.   isosorbide mononitrate 30 MG 24 hr tablet Commonly known as: IMDUR TAKE 1 TABLET DAILY   levothyroxine 75 MCG tablet Commonly known as: SYNTHROID Take 75 mcg by mouth daily at 2 am.   [name] 0.125 MG tablet Generic drug: hyoscyamine Take 0.125 mg by mouth every 4 (four) hours as needed for cramping.   losartan 50 MG tablet Commonly known as: COZAAR Take 1 tablet (50 mg total) by mouth at bedtime.   metoprolol tartrate 25 MG tablet Commonly known as: LOPRESSOR Take 1 tablet (25 mg total) by mouth 2 (two) times daily. What changed: how much to take   multivitamin with minerals Tabs tablet Take 1 tablet by mouth at bedtime.   pantoprazole 40 MG tablet Commonly known as: PROTONIX Take 40 mg by mouth daily.   Vitamin D3 50 MCG (2000 UT) Tabs Take 2,000 Units by mouth at bedtime.        Follow-up Information     Laurann Montana, MD. Schedule an appointment as soon as possible for a visit in 1 week(s).   Specialty: Family Medicine Contact information: [address] [place] [address] [phone]         Yvonne Kendall, MD. Schedule an appointment as soon as possible for a visit in 1 week(s).   Specialty: Cardiology Contact information: [address] [place] [address] [phone]                Discharge Exam: Ceasar Mons Weights   12/23/22 1536 12/23/22 2227  Weight: 87.1 kg 88.7 kg   General.  Well-developed elderly lady, in no acute distress. Pulmonary.  Lungs clear bilaterally, normal respiratory effort. CV.  Regular rate and rhythm, no JVD, rub or murmur. Abdomen.  Soft, nontender, nondistended, BS positive. CNS.  Alert and oriented .  No focal neurologic deficit. Extremities.  No edema, no cyanosis,  pulses intact and symmetrical. Psychiatry.  Judgment and insight appears normal.   Condition at discharge: stable  The results of significant diagnostics from this hospitalization (including imaging, microbiology, ancillary and laboratory) are listed below for reference.   Imaging Studies: ECHOCARDIOGRAM COMPLETE  Result Date: 12/24/2022    ECHOCARDIOGRAM REPORT   Patient Name:   Cheryl Velez  Date of Exam: 12/24/2022 Medical Rec #:  [id number]  Height:       64.0 in Accession #:    [id number] Weight:       195.5 lb Date of Birth:  1940-12-30   BSA:          1.938 m Patient Age:    82 years   BP:           137/65 mmHg Patient Gender: F          HR:           60  bpm. Exam Location:  [facility] Procedure: 2D Echo, Cardiac Doppler and Color Doppler Indications:     Atrial Fibrillation I48.91  History:         Patient has prior history of Echocardiogram examinations, most                  recent 07/06/2017. Risk Factors:Dyslipidemia and Hypertension.                  PTSD.  Sonographer:     Cristela Blue Referring Phys:  [id number] JAN A MANSY Diagnosing Phys: Julien Nordmann MD IMPRESSIONS  1. Left ventricular ejection fraction, by estimation, is 50 to 55%. The left ventricle has low normal function. The left ventricle demonstrates regional wall motion abnormalities (Select images concerning for periapical hypokinesis, unable to exclude stress cardiomyopathy). There is moderate concentric left ventricular hypertrophy. Left ventricular diastolic parameters are indeterminate.  2. Right ventricular systolic function is normal. The right ventricular size is normal. There is normal pulmonary artery systolic pressure. The estimated right ventricular systolic pressure is 20.8 mmHg.  3. The mitral valve is normal in structure. Mild mitral valve regurgitation. No evidence of mitral stenosis.  4. The aortic valve has an indeterminant number of cusps. Aortic valve regurgitation is not visualized. No aortic stenosis is present.  5. The  inferior vena cava is normal in size with greater than 50% respiratory variability, suggesting right atrial pressure of 3 mmHg. FINDINGS  Left Ventricle: Left ventricular ejection fraction, by estimation, is 50 to 55%. The left ventricle has low normal function. The left ventricle demonstrates regional wall motion abnormalities. The left ventricular internal cavity size was normal in size. There is moderate concentric left ventricular hypertrophy. Left ventricular diastolic parameters are indeterminate. Right Ventricle: The right ventricular size is normal. No increase in right ventricular wall thickness. Right ventricular systolic function is normal. There is normal pulmonary artery systolic pressure. The tricuspid regurgitant velocity is 1.99 m/s, and  with an assumed right atrial pressure of 5 mmHg, the estimated right ventricular systolic pressure is 20.8 mmHg. Left Atrium: Left atrial size was normal in size. Right Atrium: Right atrial size was normal in size. Pericardium: There is no evidence of pericardial effusion. Mitral Valve: The mitral valve is normal in structure. Mild mitral valve regurgitation. No evidence of mitral valve stenosis. MV peak gradient, 7.4 mmHg. The mean mitral valve gradient is 2.0 mmHg. Tricuspid Valve: The tricuspid valve is normal in structure. Tricuspid valve regurgitation is not demonstrated. No evidence of tricuspid stenosis. Aortic Valve: The aortic valve has an indeterminant number of cusps. Aortic valve regurgitation is not visualized. No aortic stenosis is present. Aortic valve mean gradient measures 4.3 mmHg. Aortic valve peak gradient measures 7.8 mmHg. Aortic valve area, by VTI measures 2.06 cm. Pulmonic Valve: The pulmonic valve was normal in structure. Pulmonic valve regurgitation is not visualized. No evidence of pulmonic stenosis. Aorta: The aortic root is normal in size and structure. Venous: The inferior vena cava is normal in size with greater than 50% respiratory  variability, suggesting right atrial pressure of 3 mmHg. IAS/Shunts: No atrial level shunt detected by color flow Doppler.  LEFT VENTRICLE PLAX 2D LVIDd:         4.20 cm   Diastology LVIDs:         2.80 cm   LV e' medial:    10.40 cm/s LV PW:         1.10 cm   LV E/e' medial:  12.2 LV IVS:  1.00 cm   LV e' lateral:   6.09 cm/s LVOT diam:     2.00 cm   LV E/e' lateral: 20.9 LV SV:         67 LV SV Index:   34 LVOT Area:     3.14 cm  RIGHT VENTRICLE RV Basal diam:  4.00 cm RV Mid diam:    2.80 cm RV S prime:     12.40 cm/s TAPSE (M-mode): 3.0 cm LEFT ATRIUM           Index        RIGHT ATRIUM           Index LA diam:      3.40 cm 1.75 cm/m   RA Area:     16.10 cm LA Vol (A4C): 81.3 ml 41.96 ml/m  RA Volume:   38.40 ml  19.82 ml/m  AORTIC VALVE AV Area (Vmax):    1.98 cm AV Area (Vmean):   1.96 cm AV Area (VTI):     2.06 cm AV Vmax:           139.33 cm/s AV Vmean:          95.467 cm/s AV VTI:            0.323 m AV Peak Grad:      7.8 mmHg AV Mean Grad:      4.3 mmHg LVOT Vmax:         87.80 cm/s LVOT Vmean:        59.500 cm/s LVOT VTI:          0.212 m LVOT/AV VTI ratio: 0.66  AORTA Ao Root diam: 2.90 cm MITRAL VALVE                TRICUSPID VALVE MV Area (PHT): 4.99 cm     TR Peak grad:   15.8 mmHg MV Area VTI:   2.29 cm     TR Vmax:        199.00 cm/s MV Peak grad:  7.4 mmHg MV Mean grad:  2.0 mmHg     SHUNTS MV Vmax:       1.36 m/s     Systemic VTI:  0.21 m MV Vmean:      64.9 cm/s    Systemic Diam: 2.00 cm MV Decel Time: 152 msec MV E velocity: 127.00 cm/s MV A velocity: 55.00 cm/s MV E/A ratio:  2.31 Julien Nordmann MD Electronically signed by Julien Nordmann MD Signature Date/Time: 12/24/2022/10:33:17 AM    Final    DG Chest 2 View  Result Date: 12/23/2022 CLINICAL DATA:  Chest pain EXAM: CHEST - 2 VIEW COMPARISON:  Seven hundred twenty 1,022 FINDINGS: The heart size and mediastinal contours are within normal limits. Mild diffuse bilateral interstitial pulmonary opacity. The visualized skeletal  structures are unremarkable. IMPRESSION: Mild diffuse bilateral interstitial pulmonary opacity, consistent with edema or atypical/viral infection. No focal airspace opacity. Electronically Signed   By: Jearld Lesch M.D.   On: 12/23/2022 16:43    Microbiology: No results found for this or any previous visit.  Labs: CBC: Recent Labs  Lab 12/23/22 1538  WBC 7.8  HGB 13.1  HCT 40.1  MCV 92.4  PLT 258   Basic Metabolic Panel: Recent Labs  Lab 12/23/22 1538 12/24/22 0853  NA 138 139  K 4.1 3.8  CL 107 110  CO2 22 21*  GLUCOSE 142* 107*  BUN 22 16  CREATININE 0.93 0.69  CALCIUM 9.9 8.8*   Liver  Function Tests: No results for input(s): "AST", "ALT", "ALKPHOS", "BILITOT", "PROT", "ALBUMIN" in the last 168 hours. CBG: No results for input(s): "GLUCAP" in the last 168 hours.  Discharge time spent: greater than 30 minutes.  This record has been created using Conservation officer, historic buildings. Errors have been sought and corrected,but may not always be located. Such creation errors do not reflect on the standard of care.   Signed: Arnetha Courser, MD [facility] 12/24/2022

## 2022-12-24 NOTE — Hospital Course (Addendum)
Taken from H&P.  Cheryl Velez is a 82 y.o. Caucasian female with medical history significant for hypertension, dyslipidemia, hypothyroidism, OSA, who presented to the emergency room with acute onset of midsternal chest pain felt as pressure and burning that lasted about 45 minutes earlier today with associated nausea and dyspnea and possibly mild palpitations.   On arrival BP was 148/112 and heart rate 109 with otherwise normal vital signs.  Mostly unremarkable labs, troponin 33>>77 EKG NSR, poor R wave progression and T wave inversion anterolaterally. CXR with mild diffuse bilateral interstitial pulmonary opacity consistent with edema or atypical/viral infection with no focal airspace opacity.  She was given full dose aspirin and also started on heparin infusion due to worsening troponin. Cardiology was consulted.  8/28: Vitals stable.  Troponin peaked at 189, TSH normal, BNP elevated at 598.  Echocardiogram with normal EF, concentric LVH, apical hypokinesis cannot be ruled out.  Cardiology evaluated her and they were recommending outpatient follow-up with possible CT coronary angiography.  Patient was found to be in A-fib with RVR in ED, spontaneously converted back to normal sinus rhythm and remained in sinus since then.CHA2DS2-VASc at least 5 (HTN, age x 2, vascular disease, sex category), cardiology increased her home metoprolol to 25 mg twice daily and added Eliquis.  They will follow-up as an outpatient and she will likely get a ZIO monitor for further evaluation.  Patient is apparently unable to tolerate statin, gets myalgias and fatigue and refusing to go back on it.  His outpatient provider can order other cholesterol medications as appropriate.  Her chest pain completely resolved.  She was able to ambulate without any difficulty.  Patient is being discharged to continue rest of her home medications, should avoid NSAID to decrease the risk of bleeding with Eliquis.  Patient will follow-up  with cardiology as outpatient for further recommendations.

## 2022-12-24 NOTE — Progress Notes (Signed)
*  PRELIMINARY RESULTS* Echocardiogram 2D Echocardiogram has been performed.  Cheryl Velez 12/24/2022, 8:12 AM

## 2022-12-31 DIAGNOSIS — I480 Paroxysmal atrial fibrillation: Secondary | ICD-10-CM | POA: Diagnosis not present

## 2022-12-31 DIAGNOSIS — Z09 Encounter for follow-up examination after completed treatment for conditions other than malignant neoplasm: Secondary | ICD-10-CM | POA: Diagnosis not present

## 2022-12-31 DIAGNOSIS — I422 Other hypertrophic cardiomyopathy: Secondary | ICD-10-CM | POA: Diagnosis not present

## 2022-12-31 DIAGNOSIS — D6869 Other thrombophilia: Secondary | ICD-10-CM | POA: Diagnosis not present

## 2023-01-05 NOTE — Progress Notes (Unsigned)
Cardiology Office Note    Date:  01/05/2023   ID:  Cheryl Velez, DOB [birth year], MRN [id number]  PCP:  Laurann Montana, MD  Cardiologist:  Yvonne Kendall, MD  Electrophysiologist:  None   Chief Complaint: Hospital follow-up  History of Present Illness:   Cheryl Velez is a 82 y.o. female with history of ***  High-sensitivity troponin 77 with a delta and peak of 189.  BNP 598.  Echo showed an EF of 50 to 55%, select images concerning for periapical hypokinesis - unable to exclude stress-induced cardiomyopathy, moderate concentric LVH, normal RV systolic function, ventricular cavity size, and PASP, mild mitral regurgitation, and an estimated right atrial pressure of 3 mmHg.  ***   Labs independently reviewed: 11/2022 - potassium 3.8, BUN 16, serum creatinine 0.69, TC 137, TG 105, HDL 34, LDL 82, TSH normal, Hgb 13.1, PLT 258 07/2022 -[place] 2.0 10/2020 - albumin 3.9, AST/ALT normal  Past Medical History:  Diagnosis Date   Adenomatous colon polyp    Arthritis    Chest pain 01/06/2013   Chest pain    GI (gastrointestinal bleed)    DR. Bosie Clos, ADENOMATOUS POLYP 6/14, 3 YEAR FOLLOW UP, 9/17, 3 YEAR FOLLOW UP.   Hearing loss    BIATERAL HEARING AIDS, DR. Memory Argue   Hypercholesterolemia    Hyperlipidemia    Hypertension    DX 8/16   Hypothyroidism    OSA (obstructive sleep apnea)    (PSG 03/21/12 ESS 6, AHI 14/hr REM 44/hr, RDI SAME, O2 min 77% APAP STARTED 12/15), DR. Earl Gala   Osteoarthritis    KNEES, HANDS HAS SEEN [name]   PTSD (post-traumatic stress disorder)    Thyroid disease     Past Surgical History:  Procedure Laterality Date   CARDIAC CATHETERIZATION     KNEE SURGERY     LEFT HEART CATH AND CORONARY ANGIOGRAPHY N/A 06/23/2017   Procedure: LEFT HEART CATH AND CORONARY ANGIOGRAPHY;  Surgeon: Tonny Bollman, MD;  Location: [facility] INVASIVE CV LAB;  Service: Cardiovascular;  Laterality: N/A;    Current Medications: No outpatient medications have been marked as taking  for the 01/06/23 encounter (Appointment) with Sondra Barges, PA-C.    Allergies:   Lipitor [atorvastatin], Paxil [paroxetine hcl], Shellfish allergy, Latex, Nickel, and Other   Social History   Socioeconomic History   Marital status: Married    Spouse name: Hydrologist   Number of children: 3   Years of education: Not on file   Highest education level: Not on file  Occupational History   Occupation: RETIRED  Tobacco Use   Smoking status: Never   Smokeless tobacco: Never  Vaping Use   Vaping status: Never Used  Substance and Sexual Activity   Alcohol use: Yes    Comment: occassional glass of wine   Drug use: No   Sexual activity: Not on file  Other Topics Concern   Not on file  Social History Narrative   Not on file   Social Determinants of Health   Financial Resource Strain: Not on file  Food Insecurity: No Food Insecurity (12/23/2022)   Hunger Vital Sign    Worried About Running Out of Food in the Last Year: Never true    Ran Out of Food in the Last Year: Never true  Transportation Needs: No Transportation Needs (12/23/2022)   PRAPARE - Administrator, Civil Service (Medical): No    Lack of Transportation (Non-Medical): No  Physical Activity: Not on file  Stress: Not  on file  Social Connections: Not on file     Family History:  The patient's family history includes Alcohol abuse in her sister; Blindness in her brother; Breast cancer in her paternal grandmother; CAD in her father, maternal grandfather, maternal grandmother, paternal grandfather, and paternal grandmother; COPD in her sister; CVA in her brother; Cirrhosis in her father; Colon polyps in her mother; Diabetes (age of onset: 58) in her brother; Diverticulitis in her sister; Gout in her sister; Heart Problems in her brother; Heart Problems (age of onset: 28) in her brother; Heart attack in her brother; Hypertension in her brother, brother, father, mother, sister, and sister; Hypertension (age of onset: 70)  in her brother; Obesity in her sister; Other in her brother; Polycythemia in her sister; Stroke in her mother; Suicidality in her sister.  ROS:   12-point review of systems is negative unless otherwise noted in the HPI.   EKGs/Labs/Other Studies Reviewed:    Studies reviewed were summarized above. The additional studies were reviewed today:  2D echo 12/24/2022: 1. Left ventricular ejection fraction, by estimation, is 50 to 55%. The  left ventricle has low normal function. The left ventricle demonstrates  regional wall motion abnormalities (Select images concerning for  periapical hypokinesis, unable to exclude  stress cardiomyopathy). There is moderate concentric left ventricular  hypertrophy. Left ventricular diastolic parameters are indeterminate.   2. Right ventricular systolic function is normal. The right ventricular  size is normal. There is normal pulmonary artery systolic pressure. The  estimated right ventricular systolic pressure is 20.8 mmHg.   3. The mitral valve is normal in structure. Mild mitral valve  regurgitation. No evidence of mitral stenosis.   4. The aortic valve has an indeterminant number of cusps. Aortic valve  regurgitation is not visualized. No aortic stenosis is present.   5. The inferior vena cava is normal in size with greater than 50%  respiratory variability, suggesting right atrial pressure of 3 mmHg.  __________  Carotid artery ultrasound 02/20/2022: Summary:  Right Carotid: There is no evidence of stenosis in the right ICA.   Left Carotid: There is no evidence of stenosis in the left ICA.   Vertebrals:  Bilateral vertebral arteries demonstrate antegrade flow.  Subclavians: Normal flow hemodynamics were seen in bilateral subclavian arteries.  __________   48-hour Holter 07/2017: The patient was monitored for 48 hours. The predominant rhythm was sinus with an average rate of 64 bpm (range 49-106 bpm). The longest R-R interval was 1.4  seconds. Rare PAC's and PVC's were observed. No sustained arrhythmia or prolonged pause was seen. Patient diary events correspond to sinus rhythm.   Predominantly sinus rhythm with rare PAC's and PVC's.  No significant arrhythmias. __________   2D echo 07/06/2017: - Left ventricle: The cavity size was normal. There was moderate    concentric hypertrophy. Systolic function was normal. The    estimated ejection fraction was in the range of 55% to 60%. Wall    motion was normal; there were no regional wall motion    abnormalities. Features are consistent with a pseudonormal left    ventricular filling pattern, with concomitant abnormal relaxation    and increased filling pressure (grade 2 diastolic dysfunction).    Doppler parameters are consistent with high ventricular filling    pressure.  - Aortic valve: Transvalvular velocity was within the normal range.    There was no stenosis. There was trivial regurgitation.  - Mitral valve: Transvalvular velocity was within the normal range.  There was no evidence for stenosis. There was trivial    regurgitation.  - Left atrium: The atrium was mildly dilated.  - Right ventricle: The cavity size was normal. Wall thickness was    normal. Systolic function was normal.  - Atrial septum: No defect or patent foramen ovale was identified    by color flow Doppler.  - Tricuspid valve: There was mild regurgitation.  - Pulmonary arteries: Systolic pressure was within the normal    range. PA peak pressure: 31 mm Hg (S). __________   LHC 06/23/2017: 1. Widely patent coronary arteries with minor nonobstructive CAD  2. Vigorous LV function with LVEF >65% and apical hypertrophy noted 3. Moderately elevated LVEDP   Recommend: medical management __________   Nuclear stress test 01/08/2013: 1.  No evidence of reversible/inducible ischemia.  2.  Normal ventricular wall motion.  3.  Calculated ejection fraction 61%.    EKG:  EKG is ordered today.  The  EKG ordered today demonstrates ***  Recent Labs: 12/23/2022: B Natriuretic Peptide 598.4; Hemoglobin 13.1; Platelets 258; TSH 2.753 12/24/2022: BUN 16; Creatinine, Ser 0.69; Potassium 3.8; Sodium 139  Recent Lipid Panel    Component Value Date/Time   CHOL 137 12/24/2022 0852   TRIG 105 12/24/2022 0852   HDL 34 (L) 12/24/2022 0852   CHOLHDL 4.0 12/24/2022 0852   VLDL 21 12/24/2022 0852   LDLCALC 82 12/24/2022 0852    PHYSICAL EXAM:    VS:  There were no vitals taken for this visit.  BMI: There is no height or weight on file to calculate BMI.  Physical Exam  Wt Readings from Last 3 Encounters:  12/23/22 195 lb 8 oz (88.7 kg)  08/08/22 196 lb 12.8 oz (89.3 kg)  01/15/22 203 lb (92.1 kg)     ASSESSMENT & PLAN:   ***   {Are you ordering a CV Procedure (e.g. stress test, cath, DCCV, TEE, etc)?   Press F2        :[id number]}     Disposition: F/u with Dr. Okey Dupre or an APP in ***.   Medication Adjustments/Labs and Tests Ordered: Current medicines are reviewed at length with the patient today.  Concerns regarding medicines are outlined above. Medication changes, Labs and Tests ordered today are summarized above and listed in the Patient Instructions accessible in Encounters.   Signed, Eula Listen, PA-C 01/05/2023 5:23 PM     [facility] [place] [address] [place], [place] [address] [phone]

## 2023-01-06 ENCOUNTER — Encounter: Payer: Self-pay | Admitting: Physician Assistant

## 2023-01-06 ENCOUNTER — Ambulatory Visit: Payer: Medicare Other | Attending: Physician Assistant | Admitting: Physician Assistant

## 2023-01-06 VITALS — BP 124/60 | HR 57 | Ht 64.0 in | Wt 194.8 lb

## 2023-01-06 DIAGNOSIS — R072 Precordial pain: Secondary | ICD-10-CM | POA: Diagnosis present

## 2023-01-06 DIAGNOSIS — E781 Pure hyperglyceridemia: Secondary | ICD-10-CM | POA: Diagnosis not present

## 2023-01-06 DIAGNOSIS — I480 Paroxysmal atrial fibrillation: Secondary | ICD-10-CM | POA: Diagnosis present

## 2023-01-06 DIAGNOSIS — I519 Heart disease, unspecified: Secondary | ICD-10-CM | POA: Diagnosis not present

## 2023-01-06 DIAGNOSIS — I422 Other hypertrophic cardiomyopathy: Secondary | ICD-10-CM | POA: Diagnosis not present

## 2023-01-06 DIAGNOSIS — I2489 Other forms of acute ischemic heart disease: Secondary | ICD-10-CM | POA: Diagnosis present

## 2023-01-06 DIAGNOSIS — I10 Essential (primary) hypertension: Secondary | ICD-10-CM

## 2023-01-06 MED ORDER — METOPROLOL TARTRATE 25 MG PO TABS: 12.5000 mg | ORAL_TABLET | Freq: Two times a day (BID) | ORAL | 1 refills | Status: AC

## 2023-01-06 NOTE — Patient Instructions (Signed)
Medication Instructions:  Your physician recommends the following medication changes.  DECREASE: Metoprolol 12.5 mg twice daily  *If you need a refill on your cardiac medications before your next appointment, please call your pharmacy*  Lab Work: None If you have labs (blood work) drawn today and your tests are completely normal, you will receive your results only by: MyChart Message (if you have MyChart) OR A paper copy in the mail If you have any lab test that is abnormal or we need to change your treatment, we will call you to review the results.  Testing/Procedures: Your provider has ordered a Lexiscan/ Exercise Myoview Stress test. This will take place at [facility]. Please report to the [facility] medical mall entrance. The volunteers at the first desk will direct you where to go.  [facility] MYOVIEW  Your provider has ordered a Stress Test with nuclear imaging. The purpose of this test is to evaluate the blood supply to your heart muscle. This procedure is referred to as a "Non-Invasive Stress Test." This is because other than having an IV started in your vein, nothing is inserted or "invades" your body. Cardiac stress tests are done to find areas of poor blood flow to the heart by determining the extent of coronary artery disease (CAD). Some patients exercise on a treadmill, which naturally increases the blood flow to your heart, while others who are unable to walk on a treadmill due to physical limitations will have a pharmacologic/chemical stress agent called Lexiscan . This medicine will mimic walking on a treadmill by temporarily increasing your coronary blood flow.   Please note: these test may take anywhere between 2-4 hours to complete  How to prepare for your Myoview test:  Nothing to eat for 6 hours prior to the test No caffeine for 24 hours prior to test No smoking 24 hours prior to test. Your medication may be taken with water.  If your doctor stopped a medication because of this test,  do not take that medication. Ladies, please do not wear dresses.  Skirts or pants are appropriate. Please wear a short sleeve shirt. No perfume, cologne or lotion. Wear comfortable walking shoes. No heels!   PLEASE NOTIFY THE OFFICE AT LEAST 24 HOURS IN ADVANCE IF YOU ARE UNABLE TO KEEP YOUR APPOINTMENT.  [phone] AND  PLEASE NOTIFY NUCLEAR MEDICINE AT [facility] AT LEAST 24 HOURS IN ADVANCE IF YOU ARE UNABLE TO KEEP YOUR APPOINTMENT. [phone]    Follow-Up: At [facility], you and your health needs are our priority.  As part of our continuing mission to provide you with exceptional heart care, we have created designated Provider Care Teams.  These Care Teams include your primary Cardiologist (physician) and Advanced Practice Providers (APPs -  Physician Assistants and Nurse Practitioners) who all work together to provide you with the care you need, when you need it.  We recommend signing up for the patient portal called "MyChart".  Sign up information is provided on this After Visit Summary.  MyChart is used to connect with patients for Virtual Visits (Telemedicine).  Patients are able to view lab/test results, encounter notes, upcoming appointments, etc.  Non-urgent messages can be sent to your provider as well.   To learn more about what you can do with MyChart, go to [email or web address].    Your next appointment:   4 month(s)  Provider:   You may see Yvonne Kendall, MD or one of the following Advanced Practice Providers on your designated Care Team:   Eula Listen,  PA-C

## 2023-01-13 ENCOUNTER — Ambulatory Visit
Admission: RE | Admit: 2023-01-13 | Discharge: 2023-01-13 | Disposition: A | Discharge status: Home / Self Care | Payer: Medicare Other | Source: Ambulatory Visit | Attending: Physician Assistant | Admitting: Physician Assistant

## 2023-01-13 DIAGNOSIS — I2489 Other forms of acute ischemic heart disease: Secondary | ICD-10-CM | POA: Insufficient documentation

## 2023-01-13 MED ORDER — TECHNETIUM TC 99M TETROFOSMIN IV KIT
31.5400 | PACK | Freq: Once | INTRAVENOUS | Status: AC | PRN
  Administered 2023-01-13: 31.54 via INTRAVENOUS

## 2023-01-13 MED ORDER — REGADENOSON 0.4 MG/5ML IV SOLN
0.4000 mg | Freq: Once | INTRAVENOUS | Status: AC
  Administered 2023-01-13: 0.4 mg via INTRAVENOUS
  Filled 2023-01-13: qty 5

## 2023-01-13 MED ORDER — TECHNETIUM TC 99M TETROFOSMIN IV KIT
10.1100 | PACK | Freq: Once | INTRAVENOUS | Status: AC | PRN
  Administered 2023-01-13: 10.11 via INTRAVENOUS

## 2023-01-14 ENCOUNTER — Other Ambulatory Visit: Payer: TRICARE For Life (TFL)

## 2023-01-14 LAB — NM MYOCAR MULTI W/SPECT W/WALL MOTION / EF
LV dias vol: 54 mL (ref 46–106)
LV sys vol: 19 mL
Nuc Stress EF: 65 %
Peak HR: 83 {beats}/min
Rest HR: 60 {beats}/min
Rest Nuclear Isotope Dose: 10.1 mCi
SDS: 0
SRS: 9
SSS: 0
ST Depression (mm): 0 mm
Stress Nuclear Isotope Dose: 31.5 mCi
TID: 1.09

## 2023-01-15 ENCOUNTER — Other Ambulatory Visit: Payer: Self-pay | Admitting: Emergency Medicine

## 2023-01-15 ENCOUNTER — Telehealth: Payer: Self-pay | Admitting: Emergency Medicine

## 2023-01-15 DIAGNOSIS — R918 Other nonspecific abnormal finding of lung field: Secondary | ICD-10-CM

## 2023-01-15 NOTE — Progress Notes (Signed)
See previous note for call--  Attempted to call pt back, message left, order placed for CT scan at [facility] - per pt preference

## 2023-01-16 NOTE — Telephone Encounter (Signed)
Entered in error

## 2023-01-22 ENCOUNTER — Ambulatory Visit
Admission: RE | Admit: 2023-01-22 | Discharge: 2023-01-22 | Disposition: A | Discharge status: Home / Self Care | Payer: Medicare Other | Source: Ambulatory Visit | Attending: Physician Assistant | Admitting: Physician Assistant

## 2023-01-22 DIAGNOSIS — R918 Other nonspecific abnormal finding of lung field: Secondary | ICD-10-CM | POA: Diagnosis not present

## 2023-01-22 DIAGNOSIS — I700 Atherosclerosis of aorta: Secondary | ICD-10-CM | POA: Diagnosis not present

## 2023-01-26 ENCOUNTER — Telehealth: Payer: Self-pay | Admitting: Physician Assistant

## 2023-01-26 DIAGNOSIS — M3501 Sicca syndrome with keratoconjunctivitis: Secondary | ICD-10-CM | POA: Diagnosis not present

## 2023-01-26 DIAGNOSIS — H43813 Vitreous degeneration, bilateral: Secondary | ICD-10-CM | POA: Diagnosis not present

## 2023-01-26 DIAGNOSIS — Z961 Presence of intraocular lens: Secondary | ICD-10-CM | POA: Diagnosis not present

## 2023-01-26 NOTE — Telephone Encounter (Signed)
   Pre-operative Risk Assessment    Patient Name: Cheryl Velez  DOB: 11/13/1940 MRN: [id number]      Request for Surgical Clearance    Procedure:   Colonoscopy  with Propofol  Date of Surgery:  Clearance TBD                                 Surgeon:  Dr. Charlott Rakes Surgeon's Group or Practice Name:  [facility] Phone number:  [phone] Fax number:  [phone]   Type of Clearance Requested:   Pharmacy Eliquis   Type of Anesthesia:   Propofol   Additional requests/questions:    SignedMaceo Pro Schools   01/26/2023, 2:47 PM

## 2023-01-26 NOTE — Telephone Encounter (Signed)
Pharmacy please advise on holding Eliquis prior to colonoscopy scheduled for TBD. Thank you.   

## 2023-01-27 ENCOUNTER — Telehealth: Payer: Self-pay | Admitting: *Deleted

## 2023-01-27 NOTE — Telephone Encounter (Signed)
Spoke with the patient who states she received 3 calls from the [place] office but no one left a message. She states she is frustrated because she has a procedure coming up and does not know what to do with her Eliquis.   Reviewed the chart and informed the patient to hold Eliquis 1-2 days prior to procedure. Patient agreeable voiced understanding.

## 2023-01-27 NOTE — Telephone Encounter (Signed)
Patient with diagnosis of afib diagnosed 11/2022 on Eliquis for anticoagulation.    Procedure: colonoscopy Date of procedure: TBD  CHA2DS2-VASc Score = 5  This indicates a 7.2% annual risk of stroke. The patient's score is based upon: CHF History: 0 HTN History: 1 Diabetes History: 0 Stroke History: 0 Vascular Disease History: 1 Age Score: 2 Gender Score: 1  CrCl 39mL/min using adjusted body weight Platelet count 258K  Per office protocol, patient can hold Eliquis for 1-2 days prior to procedure.    **This guidance is not considered finalized until pre-operative APP has relayed final recommendations.**

## 2023-01-27 NOTE — Telephone Encounter (Signed)
Patient was returning phone call about medical clearance. Please call back

## 2023-01-27 NOTE — Telephone Encounter (Signed)
   Patient Name: Cheryl Velez  DOB: [birth year] MRN: [id number]  Primary Cardiologist: Yvonne Kendall, MD  Clinical pharmacists have reviewed the patient's past medical history, labs, and current medications as part of preoperative protocol coverage. The following recommendations have been made:  Per office protocol, patient can hold Eliquis for 1-2 days prior to procedure.   I will route this recommendation to the requesting party via Epic fax function and remove from pre-op pool.  Please call with questions.  Napoleon Form, Leodis Rains, NP 01/27/2023, 8:45 AM

## 2023-02-13 DIAGNOSIS — R9389 Abnormal findings on diagnostic imaging of other specified body structures: Secondary | ICD-10-CM | POA: Diagnosis not present

## 2023-02-13 DIAGNOSIS — I480 Paroxysmal atrial fibrillation: Secondary | ICD-10-CM | POA: Diagnosis not present

## 2023-02-13 DIAGNOSIS — I422 Other hypertrophic cardiomyopathy: Secondary | ICD-10-CM | POA: Diagnosis not present

## 2023-02-13 DIAGNOSIS — E039 Hypothyroidism, unspecified: Secondary | ICD-10-CM | POA: Diagnosis not present

## 2023-02-13 DIAGNOSIS — D6869 Other thrombophilia: Secondary | ICD-10-CM | POA: Diagnosis not present

## 2023-02-13 DIAGNOSIS — E669 Obesity, unspecified: Secondary | ICD-10-CM | POA: Diagnosis not present

## 2023-02-13 DIAGNOSIS — E785 Hyperlipidemia, unspecified: Secondary | ICD-10-CM | POA: Diagnosis not present

## 2023-02-13 DIAGNOSIS — I10 Essential (primary) hypertension: Secondary | ICD-10-CM | POA: Diagnosis not present

## 2023-02-23 ENCOUNTER — Other Ambulatory Visit: Payer: Self-pay | Admitting: *Deleted

## 2023-02-23 DIAGNOSIS — I480 Paroxysmal atrial fibrillation: Secondary | ICD-10-CM

## 2023-02-23 MED ORDER — APIXABAN 5 MG PO TABS: 5.0000 mg | ORAL_TABLET | Freq: Two times a day (BID) | ORAL | 1 refills | Status: DC

## 2023-02-23 NOTE — Telephone Encounter (Signed)
Eliquis 5mg  refill request received. Patient is 82 years old, weight-88.4kg, Crea-0.69 on 12/24/22, Diagnosis-Afib, and last seen by Eula Listen on 01/06/23. Dose is appropriate based on dosing criteria. Will send in refill to requested pharmacy.

## 2023-03-03 ENCOUNTER — Ambulatory Visit: Payer: TRICARE For Life (TFL)

## 2023-03-03 ENCOUNTER — Other Ambulatory Visit: Payer: TRICARE For Life (TFL)

## 2023-03-05 ENCOUNTER — Ambulatory Visit: Payer: TRICARE For Life (TFL)

## 2023-04-01 ENCOUNTER — Ambulatory Visit
Admission: RE | Admit: 2023-04-01 | Discharge: 2023-04-01 | Disposition: A | Discharge status: Home / Self Care | Payer: Medicare Other | Source: Ambulatory Visit | Attending: Family Medicine | Admitting: Family Medicine

## 2023-04-01 DIAGNOSIS — Z1231 Encounter for screening mammogram for malignant neoplasm of breast: Secondary | ICD-10-CM

## 2023-04-03 ENCOUNTER — Telehealth: Payer: Self-pay | Admitting: Internal Medicine

## 2023-04-03 NOTE — Telephone Encounter (Signed)
*  STAT* If patient is at the pharmacy, call can be transferred to refill team.   1. Which medications need to be refilled? (please list name of each medication and dose if known)   isosorbide mononitrate (IMDUR) 30 MG 24 hr tablet   2. Which pharmacy/location (including street and city if local pharmacy) is medication to be sent to? [facility] - [place], [place] - [address] Phone: [phone]  Fax: [phone]     3. Do they need a 30 day or 90 day supply? 90

## 2023-04-06 MED ORDER — ISOSORBIDE MONONITRATE ER 30 MG PO TB24: 30.0000 mg | ORAL_TABLET | Freq: Every day | ORAL | 0 refills | Status: DC

## 2023-04-06 NOTE — Telephone Encounter (Signed)
Requested Prescriptions   Signed Prescriptions Disp Refills   isosorbide mononitrate (IMDUR) 30 MG 24 hr tablet 90 tablet 0    Sig: Take 1 tablet (30 mg total) by mouth daily.    Authorizing Provider: END, CHRISTOPHER    Ordering User: Guerry Minors

## 2023-05-08 ENCOUNTER — Ambulatory Visit: Payer: Medicare Other | Attending: Physician Assistant | Admitting: Physician Assistant

## 2023-05-08 NOTE — Progress Notes (Deleted)
 Cardiology Office Note    Date:  05/08/2023   ID:  Cheryl Velez, Cheryl Velez [birth year], MRN [id number]  PCP:  Teresa Channel, MD  Cardiologist:  Lonni Hanson, MD  Electrophysiologist:  None   Chief Complaint: Follow up  History of Present Illness:   Cheryl Velez is a 83 y.o. female with history of CAD noted on CT imaging, apical hypertrophic cardiomyopathy, recently diagnosed A-fib in 11/2022 on apixaban , aortic atherosclerosis, HTN, HLD, obstructive sleep apnea, hypothyroidism, osteoarthritis, PTSD, and GERD who presents for follow up of ***.   Nuclear stress test in 12/2012 showed no evidence of reversible/inducible ischemia with an EF of 61%.  LHC in 05/2017 showed widely patent coronary arteries with minor nonobstructive CAD.  There was vigorous LV systolic function with an EF greater than 65% and apical hypertrophy noted.  Medical management was advised.  Echo in 06/2017 showed an EF of 55 to 60%, moderate concentric LVH, no regional wall motion abnormalities, grade 2 diastolic dysfunction, trivial aortic insufficiency, trivial mitral regurgitation, mildly dilated left atrium, normal RV systolic function and ventricular cavity size, and an estimated PASP of 31 mmHg.  Holter monitor in 07/2017 showed a predominant rhythm of sinus with an average rate of 64 (range 49 to 106 bpm), rare PACs and PVCs, and no sustained arrhythmias or prolonged pauses.   She was seen in the ED on 11/14/2020 with chest tightness.  Work-up was largely unrevealing, though high-sensitivity troponin was borderline elevated with an initial value of 17 and a delta of 22.  D-dimer was negative.  EKG demonstrated stable ST-T findings consistent with apical hypertrophic cardiomyopathy.  She was discharged on Imdur  at request of Dr. Orinda.  She was seen in the office on 12/05/2020 and indicated she had been feeling better.  She did continue to note occasional vague chest discomfort, though was improved from the sharp chest pain and chest  pressure that led to her ED visit in 10/2020.  In hindsight, she wondered if having stopped famotidine prior to the onset of chest pain contributed to her symptoms.  She also noted occasional brief palpitations that were not associated with her above chest pain.  Given resolution of symptoms, she preferred to defer further ischemic evaluation at that time with recommendation to continue medical management.  She underwent carotid artery ultrasound in 01/2022 which demonstrated no evidence of stenosis in the bilateral ICAs with antegrade flow of the bilateral vertebral arteries and normal flow hemodynamics of the bilateral subclavian arteries.   She was admitted to the hospital in 11/2022 with intermittent episodes of chest tightness and palpitations and was found to be in new onset A-fib with RVR with ventricular rates in the 120s bpm.  She had been under increased stress leading up to this admission.  High-sensitivity troponin peaked at 189.  BNP 598.  She spontaneously converted to sinus rhythm in the ED.  Echo showed an EF of 50 to 55%, select images concerning for periapical hypokinesis - unable to exclude stress-induced cardiomyopathy, moderate concentric LVH, normal RV systolic function, ventricular cavity size, and PASP, mild mitral regurgitation, and an estimated right atrial pressure of 3 mmHg.  She was initiated on apixaban .  She was seen in hospital follow-up in 12/2022 and was without further tachypalpitations.  She did notice a randomly occurring pinpoint chest discomfort that was split-second lasting.  She was adherent and tolerating apixaban .  She did note some fatigue following titration of metoprolol .  She was maintaining sinus rhythm with a mildly  bradycardic rate.  Metoprolol  was decreased to 12.5 mg twice daily.  In the setting of elevated high-sensitivity troponin noted during her admission, she underwent Lexiscan  MPI on 01/13/2023 that showed no evidence of significant ischemia or scar with an  EF of 65%.  Coronary artery calcification and aortic atherosclerosis were noted.  Overall, this was a low risk scan.  Incidentally noted were patchy groundglass opacities.  She subsequently underwent a CT chest in 01/2023 for further evaluation which showed no acute abnormality in the chest with several small pulmonary nodules bilaterally with the largest measuring 4 mm.  ***   Labs independently reviewed: 11/2022 - potassium 3.8, BUN 16, serum creatinine 0.69, TC 137, TG 105, HDL 34, LDL 82, TSH normal, Hgb 13.1, PLT 258 07/2022 -[place] 2.0 10/2020 - albumin 3.9, AST/ALT normal  Past Medical History:  Diagnosis Date   Adenomatous colon polyp    Arthritis    Chest pain 01/06/2013   Chest pain    GI (gastrointestinal bleed)    DR. DIANNA, ADENOMATOUS POLYP 6/14, 3 YEAR FOLLOW UP, 9/17, 3 YEAR FOLLOW UP.   Hearing loss    BIATERAL HEARING AIDS, DR. LATANYA   Hypercholesterolemia    Hyperlipidemia    Hypertension    DX 8/16   Hypothyroidism    OSA (obstructive sleep apnea)    (PSG 03/21/12 ESS 6, AHI 14/hr REM 44/hr, RDI SAME, O2 min 77% APAP STARTED 12/15), DR. TAMMY   Osteoarthritis    KNEES, HANDS HAS SEEN [name]   PTSD (post-traumatic stress disorder)    Thyroid  disease     Past Surgical History:  Procedure Laterality Date   CARDIAC CATHETERIZATION     KNEE SURGERY     LEFT HEART CATH AND CORONARY ANGIOGRAPHY N/A 06/23/2017   Procedure: LEFT HEART CATH AND CORONARY ANGIOGRAPHY;  Surgeon: Wonda Sharper, MD;  Location[facility] INVASIVE CV LAB;  Service: Cardiovascular;  Laterality: N/A;    Current Medications: No outpatient medications have been marked as taking for the 05/08/23 encounter (Appointment) with Abigail Bernardino HERO, PA-C.    Allergies:   Lipitor [atorvastatin], Paxil [paroxetine hcl], Shellfish allergy, Latex, Nickel, and Other   Social History   Socioeconomic History   Marital status: Married    Spouse name: HYDROLOGIST   Number of children: 3   Years of  education: Not on file   Highest education level: Not on file  Occupational History   Occupation: RETIRED  Tobacco Use   Smoking status: Never   Smokeless tobacco: Never  Vaping Use   Vaping status: Never Used  Substance and Sexual Activity   Alcohol use: Yes    Comment: occassional glass of wine   Drug use: No   Sexual activity: Not on file  Other Topics Concern   Not on file  Social History Narrative   Not on file   Social Drivers of Health   Financial Resource Strain: Not on file  Food Insecurity: No Food Insecurity (12/23/2022)   Hunger Vital Sign    Worried About Running Out of Food in the Last Year: Never true    Ran Out of Food in the Last Year: Never true  Transportation Needs: No Transportation Needs (12/23/2022)   PRAPARE - Administrator, Civil Service (Medical): No    Lack of Transportation (Non-Medical): No  Physical Activity: Not on file  Stress: Not on file  Social Connections: Not on file     Family History:  The patient's family history includes Alcohol  abuse in her sister; Blindness in her brother; Breast cancer in her paternal grandmother; CAD in her father, maternal grandfather, maternal grandmother, paternal grandfather, and paternal grandmother; COPD in her sister; CVA in her brother; Cirrhosis in her father; Colon polyps in her mother; Diabetes (age of onset: 8) in her brother; Diverticulitis in her sister; Gout in her sister; Heart Problems in her brother; Heart Problems (age of onset: 69) in her brother; Heart attack in her brother; Hypertension in her brother, brother, father, mother, sister, and sister; Hypertension (age of onset: 63) in her brother; Obesity in her sister; Other in her brother; Polycythemia in her sister; Stroke in her mother; Suicidality in her sister.  ROS:   12-point review of systems is negative unless otherwise noted in the HPI.   EKGs/Labs/Other Studies Reviewed:    Studies reviewed were summarized above. The  additional studies were reviewed today:  Lexiscan  MPI 01/13/2023:   Normal pharmacologic myocardial perfusion stress test without evidence of significant ischemia or scar.   Left ventricular systolic function is normal (LVEF 65%).   Coronary artery calcification and aortic atherosclerosis are noted.   Patchy groundglass opacities are evident in the visualized lungs.  Consider dedicated chest CT for further evaluation as clinically indicated.   This is a low risk study. __________  2D echo 12/24/2022: 1. Left ventricular ejection fraction, by estimation, is 50 to 55%. The  left ventricle has low normal function. The left ventricle demonstrates  regional wall motion abnormalities (Select images concerning for  periapical hypokinesis, unable to exclude  stress cardiomyopathy). There is moderate concentric left ventricular  hypertrophy. Left ventricular diastolic parameters are indeterminate.   2. Right ventricular systolic function is normal. The right ventricular  size is normal. There is normal pulmonary artery systolic pressure. The  estimated right ventricular systolic pressure is 20.8 mmHg.   3. The mitral valve is normal in structure. Mild mitral valve  regurgitation. No evidence of mitral stenosis.   4. The aortic valve has an indeterminant number of cusps. Aortic valve  regurgitation is not visualized. No aortic stenosis is present.   5. The inferior vena cava is normal in size with greater than 50%  respiratory variability, suggesting right atrial pressure of 3 mmHg.  __________   Carotid artery ultrasound 02/20/2022: Summary:  Right Carotid: There is no evidence of stenosis in the right ICA.   Left Carotid: There is no evidence of stenosis in the left ICA.   Vertebrals:  Bilateral vertebral arteries demonstrate antegrade flow.  Subclavians: Normal flow hemodynamics were seen in bilateral subclavian arteries.  __________   48-hour Holter 07/2017: The patient was monitored  for 48 hours. The predominant rhythm was sinus with an average rate of 64 bpm (range 49-106 bpm). The longest R-R interval was 1.4 seconds. Rare PAC's and PVC's were observed. No sustained arrhythmia or prolonged pause was seen. Patient diary events correspond to sinus rhythm.   Predominantly sinus rhythm with rare PAC's and PVC's.  No significant arrhythmias. __________   2D echo 07/06/2017: - Left ventricle: The cavity size was normal. There was moderate    concentric hypertrophy. Systolic function was normal. The    estimated ejection fraction was in the range of 55% to 60%. Wall    motion was normal; there were no regional wall motion    abnormalities. Features are consistent with a pseudonormal left    ventricular filling pattern, with concomitant abnormal relaxation    and increased filling pressure (grade 2 diastolic dysfunction).  Doppler parameters are consistent with high ventricular filling    pressure.  - Aortic valve: Transvalvular velocity was within the normal range.    There was no stenosis. There was trivial regurgitation.  - Mitral valve: Transvalvular velocity was within the normal range.    There was no evidence for stenosis. There was trivial    regurgitation.  - Left atrium: The atrium was mildly dilated.  - Right ventricle: The cavity size was normal. Wall thickness was    normal. Systolic function was normal.  - Atrial septum: No defect or patent foramen ovale was identified    by color flow Doppler.  - Tricuspid valve: There was mild regurgitation.  - Pulmonary arteries: Systolic pressure was within the normal    range. PA peak pressure: 31 mm Hg (S). __________   LHC 06/23/2017: 1. Widely patent coronary arteries with minor nonobstructive CAD  2. Vigorous LV function with LVEF >65% and apical hypertrophy noted 3. Moderately elevated LVEDP   Recommend: medical management __________   Nuclear stress test 01/08/2013: 1.  No evidence of  reversible/inducible ischemia.  2.  Normal ventricular wall motion.  3.  Calculated ejection fraction 61%.    EKG:  EKG is ordered today.  The EKG ordered today demonstrates ***  Recent Labs: 12/23/2022: B Natriuretic Peptide 598.4; Hemoglobin 13.1; Platelets 258; TSH 2.753 12/24/2022: BUN 16; Creatinine, Ser 0.69; Potassium 3.8; Sodium 139  Recent Lipid Panel    Component Value Date/Time   CHOL 137 12/24/2022 0852   TRIG 105 12/24/2022 0852   HDL 34 (L) 12/24/2022 0852   CHOLHDL 4.0 12/24/2022 0852   VLDL 21 12/24/2022 0852   LDLCALC 82 12/24/2022 0852    PHYSICAL EXAM:    VS:  There were no vitals taken for this visit.  BMI: There is no height or weight on file to calculate BMI.  Physical Exam  Wt Readings from Last 3 Encounters:  01/06/23 194 lb 12.8 oz (88.4 kg)  12/23/22 195 lb 8 oz (88.7 kg)  08/08/22 196 lb 12.8 oz (89.3 kg)     ASSESSMENT & PLAN:   PAF: ***.  CHA2DS2-VASc at least 5 (HTN, age x 2, vascular disease, sex category).   Systolic dysfunction with apical hypertrophic cardiomyopathy:   HTN: Blood pressure  CAD, aortic atherosclerosis, HLD with high intensity statin intolerance, hypertriglyceridemia: LDL 82 with triglyceride of 105 in 11/2022.   Pulmonary nodules: Several small pulmonary nodules were noted on CT imaging in 01/2023 with the largest measuring 4 mm.  ***   {Are you ordering a CV Procedure (e.g. stress test, cath, DCCV, TEE, etc)?   Press F2        :[id number]}     Disposition: F/u with Dr. Mady or an APP in ***.   Medication Adjustments/Labs and Tests Ordered: Current medicines are reviewed at length with the patient today.  Concerns regarding medicines are outlined above. Medication changes, Labs and Tests ordered today are summarized above and listed in the Patient Instructions accessible in Encounters.   Signed, Bernardino Bring, PA-C 05/08/2023 7:32 AM      [facility] [address] [place],  [place] [address] [phone]

## 2023-06-10 NOTE — Progress Notes (Unsigned)
Cardiology Office Note    Date:  06/11/2023   ID:  AMORI COOPERMAN, DOB 1941/01/29, MRN [id number]  PCP:  Laurann Montana, MD  Cardiologist:  Yvonne Kendall, MD  Electrophysiologist:  None   Chief Complaint: Follow-up  History of Present Illness:   Cheryl Velez is a 83 y.o. female with history of apical hypertrophic cardiomyopathy, PAF diagnosed in 11/2022 on apixaban, HTN, HLD, obstructive sleep apnea, hypothyroidism, osteoarthritis, PTSD, pulmonary nodules, and GERD who presents for follow-up of apical hypertrophic cardiomyopathy and A-fib.   Nuclear stress test in 12/2012 showed no evidence of reversible/inducible ischemia with an EF of 61%.  LHC in 05/2017 showed widely patent coronary arteries with minor nonobstructive CAD.  There was vigorous LV systolic function with an EF greater than 65% and apical hypertrophy noted.  Medical management was advised.  Echo in 06/2017 showed an EF of 55 to 60%, moderate concentric LVH, no regional wall motion abnormalities, grade 2 diastolic dysfunction, trivial aortic insufficiency, trivial mitral regurgitation, mildly dilated left atrium, normal RV systolic function and ventricular cavity size, and an estimated PASP of 31 mmHg.  With regards to her apical hypertrophic cardiomyopathy, Holter monitor in 07/2017 showed a predominant rhythm of sinus with an average rate of 64 (range 49 to 106 bpm), rare PACs and PVCs, and no sustained arrhythmias or prolonged pauses.  She was evaluated by genetic counseling with genetic testing not recommended.  She was felt to have a benign variant of apical hypertrophic cardiomyopathy.   She was seen in the ED on 11/14/2020 with chest tightness.  Work-up was largely unrevealing, though high-sensitivity troponin was borderline elevated with an initial value of 17 and a delta of 22.  D-dimer was negative.  EKG demonstrated stable ST-T findings consistent with apical hypertrophic cardiomyopathy.  She was discharged on Imdur at request of Dr.  Areatha Keas.  She was seen in the office on 12/05/2020 and indicated she had been feeling better.  She did continue to note occasional vague chest discomfort, though was improved from the sharp chest pain and chest pressure that led to her ED visit in 10/2020.  In hindsight, she wondered if having stopped famotidine prior to the onset of chest pain contributed to her symptoms.  She also noted occasional brief palpitations that were not associated with her above chest pain.  Given resolution of symptoms, she preferred to defer further ischemic evaluation at that time with recommendation to continue medical management.  She underwent carotid artery ultrasound in 01/2022 which demonstrated no evidence of stenosis in the bilateral ICAs with antegrade flow of the bilateral vertebral arteries and normal flow hemodynamics of the bilateral subclavian arteries.   She was admitted to the hospital in 11/2022 with intermittent episodes of chest tightness and palpitations and was found to be in new onset A-fib with RVR with ventricular rates in the 120s bpm.  High-sensitivity troponin peaked at 189.  BNP 598.  She spontaneously converted to sinus rhythm in the ED.  Echo showed an EF of 50 to 55%, select images concerning for periapical hypokinesis - unable to exclude stress-induced cardiomyopathy, moderate concentric LVH, normal RV systolic function, ventricular cavity size, and PASP, mild mitral regurgitation, and an estimated right atrial pressure of 3 mmHg.  She was initiated on apixaban.  She was seen in hospital follow-up in 12/2022 and was without further tachypalpitations.  She did note some randomly occurring pinpoint chest discomfort that would last for a split-second and self resolved.  She was adherent and  tolerating OAC.  With fatigue and mildly bradycardic rate, Lopressor was reduced to 12.5 mg twice daily.  Lexiscan MPI in 12/2022 showed no evidence of ischemia or scar with an EF of 65%.  Overall, this was a low risk  study.  Coronary artery calcification and aortic atherosclerosis were noted.  In addition, patchy groundglass opacities were evident in the visualized lung fields.  Subsequent CT of the chest in 01/2023 showed no acute abnormality within the chest along with several small pulmonary nodules bilaterally with the largest nodule measuring 4 mm.  She comes in doing well from a cardiac perspective and is without symptoms of angina or cardiac decompensation.  No tachypalpitations.  She does note some positional dizziness and is without symptoms of presyncope or syncope.  No falls, hematochezia, or melena.  Adherent and tolerating cardiac medications without off target effect.  Stressors are starting to improve at home.  Remains active at baseline.   Labs independently reviewed: 11/2022 - potassium 3.8, BUN 16, serum creatinine 0.69, TC 137, TG 105, HDL 34, LDL 82, TSH normal, Hgb 13.1, PLT 258 07/2022 - magnesium 2.0 10/2020 - albumin 3.9, AST/ALT normal  Past Medical History:  Diagnosis Date   Adenomatous colon polyp    Arthritis    Chest pain 01/06/2013   Chest pain    GI (gastrointestinal bleed)    DR. Bosie Clos, ADENOMATOUS POLYP 6/14, 3 YEAR FOLLOW UP, 9/17, 3 YEAR FOLLOW UP.   Hearing loss    BIATERAL HEARING AIDS, DR. Memory Argue   Hypercholesterolemia    Hyperlipidemia    Hypertension    DX 8/16   Hypothyroidism    OSA (obstructive sleep apnea)    (PSG 03/21/12 ESS 6, AHI 14/hr REM 44/hr, RDI SAME, O2 min 77% APAP STARTED 12/15), DR. Earl Gala   Osteoarthritis    KNEES, HANDS HAS SEEN [name]   PTSD (post-traumatic stress disorder)    Thyroid disease     Past Surgical History:  Procedure Laterality Date   CARDIAC CATHETERIZATION     KNEE SURGERY     LEFT HEART CATH AND CORONARY ANGIOGRAPHY N/A 06/23/2017   Procedure: LEFT HEART CATH AND CORONARY ANGIOGRAPHY;  Surgeon: Tonny Bollman, MD;  Location: [facility] INVASIVE CV LAB;  Service: Cardiovascular;  Laterality: N/A;    Current  Medications: Current Meds  Medication Sig   albuterol (VENTOLIN HFA) 108 (90 Base) MCG/ACT inhaler Inhale 1-2 puffs into the lungs every 6 (six) hours as needed.   apixaban (ELIQUIS) 5 MG TABS tablet Take 1 tablet (5 mg total) by mouth 2 (two) times daily.   b complex vitamins tablet Take 1 tablet by mouth at bedtime. SUPER B COMPLEX   Calcium Carb-Cholecalciferol (CALCIUM 600+D3 PO) Take 1 tablet by mouth at bedtime.   Cholecalciferol (VITAMIN D3) 2000 units TABS Take 2,000 Units by mouth at bedtime.   cholestyramine light (PREVALITE) 4 g packet Take 4 g by mouth 2 (two) times daily.   fenofibrate 160 MG tablet Take 160 mg by mouth at bedtime.    hyoscyamine ([name]) 0.125 MG tablet Take 0.125 mg by mouth every 4 (four) hours as needed for cramping.   isosorbide mononitrate (IMDUR) 30 MG 24 hr tablet Take 1 tablet (30 mg total) by mouth daily.   levothyroxine (SYNTHROID, LEVOTHROID) 75 MCG tablet Take 75 mcg by mouth daily at 2 am.    losartan (COZAAR) 50 MG tablet Take 1 tablet (50 mg total) by mouth at bedtime.   metoprolol tartrate (LOPRESSOR) 25 MG tablet Take  0.5 tablets (12.5 mg total) by mouth 2 (two) times daily.   Multiple Vitamin (MULTIVITAMIN WITH MINERALS) TABS tablet Take 1 tablet by mouth at bedtime.    pantoprazole (PROTONIX) 40 MG tablet Take 40 mg by mouth daily.    Allergies:   Lipitor [atorvastatin], Paxil [paroxetine hcl], Shellfish allergy, Latex, Nickel, and Other   Social History   Socioeconomic History   Marital status: Married    Spouse name: Hydrologist   Number of children: 3   Years of education: Not on file   Highest education level: Not on file  Occupational History   Occupation: RETIRED  Tobacco Use   Smoking status: Never   Smokeless tobacco: Never  Vaping Use   Vaping status: Never Used  Substance and Sexual Activity   Alcohol use: Yes    Comment: occassional glass of wine   Drug use: No   Sexual activity: Not on file  Other Topics Concern   Not  on file  Social History Narrative   Not on file   Social Drivers of Health   Financial Resource Strain: Not on file  Food Insecurity: No Food Insecurity (12/23/2022)   Hunger Vital Sign    Worried About Running Out of Food in the Last Year: Never true    Ran Out of Food in the Last Year: Never true  Transportation Needs: No Transportation Needs (12/23/2022)   PRAPARE - Administrator, Civil Service (Medical): No    Lack of Transportation (Non-Medical): No  Physical Activity: Not on file  Stress: Not on file  Social Connections: Not on file     Family History:  The patient's family history includes Alcohol abuse in her sister; Blindness in her brother; Breast cancer in her paternal grandmother; CAD in her father, maternal grandfather, maternal grandmother, paternal grandfather, and paternal grandmother; COPD in her sister; CVA in her brother; Cirrhosis in her father; Colon polyps in her mother; Diabetes (age of onset: 69) in her brother; Diverticulitis in her sister; Gout in her sister; Heart Problems in her brother; Heart Problems (age of onset: 25) in her brother; Heart attack in her brother; Hypertension in her brother, brother, father, mother, sister, and sister; Hypertension (age of onset: 60) in her brother; Obesity in her sister; Other in her brother; Polycythemia in her sister; Stroke in her mother; Suicidality in her sister.  ROS:   12-point review of systems is negative unless otherwise noted in the HPI.   EKGs/Labs/Other Studies Reviewed:    Studies reviewed were summarized above. The additional studies were reviewed today:  Lexiscan MPI 01/13/2023:   Normal pharmacologic myocardial perfusion stress test without evidence of significant ischemia or scar.   Left ventricular systolic function is normal (LVEF 65%).   Coronary artery calcification and aortic atherosclerosis are noted.   Patchy groundglass opacities are evident in the visualized lungs.  Consider  dedicated chest CT for further evaluation as clinically indicated.   This is a low risk study. __________  2D echo 12/24/2022: 1. Left ventricular ejection fraction, by estimation, is 50 to 55%. The  left ventricle has low normal function. The left ventricle demonstrates  regional wall motion abnormalities (Select images concerning for  periapical hypokinesis, unable to exclude  stress cardiomyopathy). There is moderate concentric left ventricular  hypertrophy. Left ventricular diastolic parameters are indeterminate.   2. Right ventricular systolic function is normal. The right ventricular  size is normal. There is normal pulmonary artery systolic pressure. The  estimated right ventricular systolic  pressure is 20.8 mmHg.   3. The mitral valve is normal in structure. Mild mitral valve  regurgitation. No evidence of mitral stenosis.   4. The aortic valve has an indeterminant number of cusps. Aortic valve  regurgitation is not visualized. No aortic stenosis is present.   5. The inferior vena cava is normal in size with greater than 50%  respiratory variability, suggesting right atrial pressure of 3 mmHg.  __________   Carotid artery ultrasound 02/20/2022: Summary:  Right Carotid: There is no evidence of stenosis in the right ICA.   Left Carotid: There is no evidence of stenosis in the left ICA.   Vertebrals:  Bilateral vertebral arteries demonstrate antegrade flow.  Subclavians: Normal flow hemodynamics were seen in bilateral subclavian arteries.  __________   48-hour Holter 07/2017: The patient was monitored for 48 hours. The predominant rhythm was sinus with an average rate of 64 bpm (range 49-106 bpm). The longest R-R interval was 1.4 seconds. Rare PAC's and PVC's were observed. No sustained arrhythmia or prolonged pause was seen. Patient diary events correspond to sinus rhythm.   Predominantly sinus rhythm with rare PAC's and PVC's.  No significant arrhythmias. __________    2D echo 07/06/2017: - Left ventricle: The cavity size was normal. There was moderate    concentric hypertrophy. Systolic function was normal. The    estimated ejection fraction was in the range of 55% to 60%. Wall    motion was normal; there were no regional wall motion    abnormalities. Features are consistent with a pseudonormal left    ventricular filling pattern, with concomitant abnormal relaxation    and increased filling pressure (grade 2 diastolic dysfunction).    Doppler parameters are consistent with high ventricular filling    pressure.  - Aortic valve: Transvalvular velocity was within the normal range.    There was no stenosis. There was trivial regurgitation.  - Mitral valve: Transvalvular velocity was within the normal range.    There was no evidence for stenosis. There was trivial    regurgitation.  - Left atrium: The atrium was mildly dilated.  - Right ventricle: The cavity size was normal. Wall thickness was    normal. Systolic function was normal.  - Atrial septum: No defect or patent foramen ovale was identified    by color flow Doppler.  - Tricuspid valve: There was mild regurgitation.  - Pulmonary arteries: Systolic pressure was within the normal    range. PA peak pressure: 31 mm Hg (S). __________   LHC 06/23/2017: 1. Widely patent coronary arteries with minor nonobstructive CAD  2. Vigorous LV function with LVEF >65% and apical hypertrophy noted 3. Moderately elevated LVEDP   Recommend: medical management __________   Nuclear stress test 01/08/2013: 1.  No evidence of reversible/inducible ischemia.  2.  Normal ventricular wall motion.  3.  Calculated ejection fraction 61%.   EKG:  EKG is ordered today.  The EKG ordered today demonstrates NSR, 62 bpm, inferior ST-T changes, and anterolateral T wave inversion, consistent with prior tracings  Recent Labs: 12/23/2022: B Natriuretic Peptide 598.4; Hemoglobin 13.1; Platelets 258; TSH 2.753 12/24/2022: BUN 16;  Creatinine, Ser 0.69; Potassium 3.8; Sodium 139  Recent Lipid Panel    Component Value Date/Time   CHOL 137 12/24/2022 0852   TRIG 105 12/24/2022 0852   HDL 34 (L) 12/24/2022 0852   CHOLHDL 4.0 12/24/2022 0852   VLDL 21 12/24/2022 0852   LDLCALC 82 12/24/2022 0852    PHYSICAL EXAM:  VS:  BP 124/62 (BP Location: Left Arm, Patient Position: Sitting)   Pulse 62   Ht 5\' 4"  (1.626 m)   Wt 187 lb (84.8 kg)   SpO2 96%   BMI 32.10 kg/m   BMI: Body mass index is 32.1 kg/m.  Physical Exam Vitals reviewed.  Constitutional:      Appearance: She is well-developed.  HENT:     Head: Normocephalic and atraumatic.  Eyes:     General:        Right eye: No discharge.        Left eye: No discharge.  Cardiovascular:     Rate and Rhythm: Normal rate and regular rhythm.     Heart sounds: Normal heart sounds, S1 normal and S2 normal. Heart sounds not distant. No midsystolic click and no opening snap. No murmur heard.    No friction rub.  Pulmonary:     Effort: Pulmonary effort is normal. No respiratory distress.     Breath sounds: Normal breath sounds. No decreased breath sounds, wheezing, rhonchi or rales.  Chest:     Chest wall: No tenderness.  Musculoskeletal:     Cervical back: Normal range of motion.     Right lower leg: No edema.     Left lower leg: No edema.  Skin:    General: Skin is warm and dry.     Nails: There is no clubbing.  Neurological:     Mental Status: She is alert and oriented to person, place, and time.  Psychiatric:        Speech: Speech normal.        Behavior: Behavior normal.        Thought Content: Thought content normal.        Judgment: Judgment normal.     Wt Readings from Last 3 Encounters:  06/11/23 187 lb (84.8 kg)  01/06/23 194 lb 12.8 oz (88.4 kg)  12/23/22 195 lb 8 oz (88.7 kg)     ASSESSMENT & PLAN:   PAF: Maintaining sinus rhythm on Lopressor 12.5 mg twice daily.  CHA2DS2-VASc at least 5 (HTN, age x 2, vascular disease, sex category).   Remains on apixaban 5 mg twice daily and does not meet reduced dosing criteria.  No falls or symptoms concerning for bleeding.  Check CBC and BMP.  Coronary artery calcification/aortic atherosclerosis/HLD with high intensity statin intolerance/hypertriglyceridemia: No symptoms suggestive of angina.  Recent Lexiscan MPI without evidence of ischemia and low risk with an EF of 65%.  On apixaban in place of aspirin given underlying A-fib to minimize bleeding risk.  LDL 82, triglycerides 105 in 11/2022.  Remains on fenofibrate 160 mg nightly.  Followed by PCP.  Heart healthy diet recommended.  Apical hypertrophic cardiomyopathy: No presyncope or syncope.  No high risk features.  Prior outpatient cardiac monitoring showed isolated PACs and PVCs with no NSVT.  Evaluated for genetic counseling and was felt to have a benign variant of apical hypertrophic cardiomyopathy with genetic testing not recommended.  Remains on metoprolol as outlined above.  HTN: Blood pressure is well-controlled in the office today.  Remains on losartan 50 mg and Lopressor 12.5 mg twice daily along with Imdur 30 mg.  Pulmonary nodules: Follow-up CT chest in 01/2024.  Plan to order this at next visit.   Disposition: F/u with Dr. Okey Dupre or an APP in 6 months.   Medication Adjustments/Labs and Tests Ordered: Current medicines are reviewed at length with the patient today.  Concerns regarding medicines are outlined above. Medication  changes, Labs and Tests ordered today are summarized above and listed in the Patient Instructions accessible in Encounters.   Elinor Dodge, PA-C 06/11/2023 12:04 PM     [facility] [place] [address] [place], [place] [address] [phone]

## 2023-06-11 ENCOUNTER — Encounter: Payer: Self-pay | Admitting: Physician Assistant

## 2023-06-11 ENCOUNTER — Ambulatory Visit: Payer: Medicare Other | Attending: Physician Assistant | Admitting: Physician Assistant

## 2023-06-11 VITALS — BP 124/62 | HR 62 | Ht 64.0 in | Wt 187.0 lb

## 2023-06-11 DIAGNOSIS — I10 Essential (primary) hypertension: Secondary | ICD-10-CM | POA: Insufficient documentation

## 2023-06-11 DIAGNOSIS — I480 Paroxysmal atrial fibrillation: Secondary | ICD-10-CM | POA: Insufficient documentation

## 2023-06-11 DIAGNOSIS — Z789 Other specified health status: Secondary | ICD-10-CM | POA: Diagnosis not present

## 2023-06-11 DIAGNOSIS — I422 Other hypertrophic cardiomyopathy: Secondary | ICD-10-CM | POA: Insufficient documentation

## 2023-06-11 DIAGNOSIS — Z79899 Other long term (current) drug therapy: Secondary | ICD-10-CM | POA: Diagnosis not present

## 2023-06-11 DIAGNOSIS — R911 Solitary pulmonary nodule: Secondary | ICD-10-CM | POA: Insufficient documentation

## 2023-06-11 DIAGNOSIS — I2510 Atherosclerotic heart disease of native coronary artery without angina pectoris: Secondary | ICD-10-CM | POA: Insufficient documentation

## 2023-06-11 DIAGNOSIS — E782 Mixed hyperlipidemia: Secondary | ICD-10-CM | POA: Diagnosis not present

## 2023-06-11 DIAGNOSIS — I700 Atherosclerosis of aorta: Secondary | ICD-10-CM | POA: Diagnosis not present

## 2023-06-11 NOTE — Patient Instructions (Signed)
Medication Instructions:  Your Physician recommend you continue on your current medication as directed.    *If you need a refill on your cardiac medications before your next appointment, please call your pharmacy*   Lab Work: Your provider would like for you to have following labs drawn today CBC and BMeT.   If you have labs (blood work) drawn today and your tests are completely normal, you will receive your results only by: MyChart Message (if you have MyChart) OR A paper copy in the mail If you have any lab test that is abnormal or we need to change your treatment, we will call you to review the results.   Follow-Up: At [facility], you and your health needs are our priority.  As part of our continuing mission to provide you with exceptional heart care, we have created designated Provider Care Teams.  These Care Teams include your primary Cardiologist (physician) and Advanced Practice Providers (APPs -  Physician Assistants and Nurse Practitioners) who all work together to provide you with the care you need, when you need it.  We recommend signing up for the patient portal called "MyChart".  Sign up information is provided on this After Visit Summary.  MyChart is used to connect with patients for Virtual Visits (Telemedicine).  Patients are able to view lab/test results, encounter notes, upcoming appointments, etc.  Non-urgent messages can be sent to your provider as well.   To learn more about what you can do with MyChart, go to [email or web address].    Your next appointment:   6 month(s)  Provider:   You may see Yvonne Kendall, MD or one of the following Advanced Practice Providers on your designated Care Team:   Eula Listen, [place]

## 2023-06-12 LAB — CBC
Hematocrit: 41.3 % (ref 34.0–46.6)
Hemoglobin: 13.6 g/dL (ref 11.1–15.9)
MCH: 30.2 pg (ref 26.6–33.0)
MCHC: 32.9 g/dL (ref 31.5–35.7)
MCV: 92 fL (ref 79–97)
Platelets: 283 10*3/uL (ref 150–450)
RBC: 4.51 x10E6/uL (ref 3.77–5.28)
RDW: 12.2 % (ref 11.7–15.4)
WBC: 6.7 10*3/uL (ref 3.4–10.8)

## 2023-06-12 LAB — BASIC METABOLIC PANEL
BUN/Creatinine Ratio: 17 (ref 12–28)
BUN: 17 mg/dL (ref 8–27)
CO2: 21 mmol/L (ref 20–29)
Calcium: 10.4 mg/dL — ABNORMAL HIGH (ref 8.7–10.3)
Chloride: 106 mmol/L (ref 96–106)
Creatinine, Ser: 1.02 mg/dL — ABNORMAL HIGH (ref 0.57–1.00)
Glucose: 92 mg/dL (ref 70–99)
Potassium: 5 mmol/L (ref 3.5–5.2)
Sodium: 142 mmol/L (ref 134–144)
eGFR: 55 mL/min/{1.73_m2} — ABNORMAL LOW (ref >59)

## 2023-07-13 ENCOUNTER — Other Ambulatory Visit: Payer: Self-pay | Admitting: Internal Medicine

## 2023-07-13 ENCOUNTER — Telehealth: Payer: Self-pay | Admitting: Internal Medicine

## 2023-07-13 NOTE — Telephone Encounter (Signed)
*  STAT* If patient is at the pharmacy, call can be transferred to refill team.   1. Which medications need to be refilled? (please list name of each medication and dose if known) Isosorbide Mono Er Tabs 30mg  (Imdur Er Tabs)   2. Would you like to learn more about the convenience, safety, & potential cost savings by using the [facility]? no     3. Are you open to using the [facility] (Type [facility]. no ).   4. Which pharmacy/location (including street and city if local pharmacy) is medication to be sent to?[id number] [phone] [facility] [address] dispensed by [facility] [address] [place] , [place]    5. Do they need a 30 day or 90 day supply? 90   Patient states she only has 1 pill left.

## 2023-07-14 NOTE — Telephone Encounter (Signed)
 Requested refill sent to preferred pharmacy 07/13/23.

## 2023-07-16 ENCOUNTER — Telehealth: Payer: Self-pay | Admitting: Internal Medicine

## 2023-07-16 MED ORDER — ISOSORBIDE MONONITRATE ER 30 MG PO TB24: 30.0000 mg | ORAL_TABLET | Freq: Every day | ORAL | 0 refills | Status: DC

## 2023-07-16 NOTE — Telephone Encounter (Signed)
  Patient calling the office for samples of medication:   1.  What medication and dosage are you requesting samples for?  isosorbide mononitrate (IMDUR) 30 MG 24 hr tablet    2.  Are you currently out of this medication? Yes   The patient is out of meds, and according to [facility], it will take them two weeks to send her prescription. She needs samples to use until she receives her medication.

## 2023-07-16 NOTE — Telephone Encounter (Signed)
 Nurse spoke with the patient and informed her that we do not carry Imdur samples in the office. However, the nurse and patient agreed to send a temporary 30-day supply to [facility] for the interim.

## 2023-08-20 ENCOUNTER — Telehealth: Payer: Self-pay | Admitting: Internal Medicine

## 2023-08-20 MED ORDER — ISOSORBIDE MONONITRATE ER 30 MG PO TB24: 30.0000 mg | ORAL_TABLET | Freq: Every day | ORAL | 1 refills | Status: DC

## 2023-08-20 NOTE — Telephone Encounter (Signed)
*  STAT* If patient is at the pharmacy, call can be transferred to refill team.   1. Which medications need to be refilled? (please list name of each medication and dose if known)  isosorbide  mononitrate (IMDUR ) 30 MG 24 hr tablet  2. Which pharmacy/location (including street and city if local pharmacy) is medication to be sent to? [id number] - [place], [place] - [address]  3. Do they need a 30 day or 90 day supply?   90 day supply

## 2023-08-21 DIAGNOSIS — I700 Atherosclerosis of aorta: Secondary | ICD-10-CM | POA: Diagnosis not present

## 2023-08-21 DIAGNOSIS — G4733 Obstructive sleep apnea (adult) (pediatric): Secondary | ICD-10-CM | POA: Diagnosis not present

## 2023-08-21 DIAGNOSIS — I480 Paroxysmal atrial fibrillation: Secondary | ICD-10-CM | POA: Diagnosis not present

## 2023-08-21 DIAGNOSIS — Z0000 Encounter for general adult medical examination without abnormal findings: Secondary | ICD-10-CM | POA: Diagnosis not present

## 2023-08-21 DIAGNOSIS — E039 Hypothyroidism, unspecified: Secondary | ICD-10-CM | POA: Diagnosis not present

## 2023-08-21 DIAGNOSIS — M8588 Other specified disorders of bone density and structure, other site: Secondary | ICD-10-CM | POA: Diagnosis not present

## 2023-08-21 DIAGNOSIS — I422 Other hypertrophic cardiomyopathy: Secondary | ICD-10-CM | POA: Diagnosis not present

## 2023-08-21 DIAGNOSIS — Z23 Encounter for immunization: Secondary | ICD-10-CM | POA: Diagnosis not present

## 2023-08-21 DIAGNOSIS — Z1331 Encounter for screening for depression: Secondary | ICD-10-CM | POA: Diagnosis not present

## 2023-08-21 DIAGNOSIS — E785 Hyperlipidemia, unspecified: Secondary | ICD-10-CM | POA: Diagnosis not present

## 2023-08-21 DIAGNOSIS — I2510 Atherosclerotic heart disease of native coronary artery without angina pectoris: Secondary | ICD-10-CM | POA: Diagnosis not present

## 2023-08-21 DIAGNOSIS — I10 Essential (primary) hypertension: Secondary | ICD-10-CM | POA: Diagnosis not present

## 2023-08-21 DIAGNOSIS — R7303 Prediabetes: Secondary | ICD-10-CM | POA: Diagnosis not present

## 2023-08-21 DIAGNOSIS — Z79899 Other long term (current) drug therapy: Secondary | ICD-10-CM | POA: Diagnosis not present

## 2023-08-25 ENCOUNTER — Other Ambulatory Visit: Payer: Self-pay | Admitting: Family Medicine

## 2023-08-25 DIAGNOSIS — M8580 Other specified disorders of bone density and structure, unspecified site: Secondary | ICD-10-CM

## 2023-09-23 DIAGNOSIS — L900 Lichen sclerosus et atrophicus: Secondary | ICD-10-CM | POA: Diagnosis not present

## 2023-09-25 ENCOUNTER — Other Ambulatory Visit: Payer: Self-pay | Admitting: Internal Medicine

## 2023-09-25 DIAGNOSIS — I480 Paroxysmal atrial fibrillation: Secondary | ICD-10-CM

## 2023-09-25 DIAGNOSIS — N952 Postmenopausal atrophic vaginitis: Secondary | ICD-10-CM | POA: Diagnosis not present

## 2023-09-25 DIAGNOSIS — L900 Lichen sclerosus et atrophicus: Secondary | ICD-10-CM | POA: Diagnosis not present

## 2023-09-25 NOTE — Telephone Encounter (Signed)
 Prescription refill request for Eliquis  received. Indication:afib Last office visit:2/25 Scr:1.02  2/25 Age: 83 Weight:84.8  kg  Prescription refilled

## 2023-10-08 ENCOUNTER — Other Ambulatory Visit: Payer: Self-pay | Admitting: Family Medicine

## 2023-10-08 ENCOUNTER — Encounter: Payer: Self-pay | Admitting: Family Medicine

## 2023-10-08 DIAGNOSIS — M8580 Other specified disorders of bone density and structure, unspecified site: Secondary | ICD-10-CM

## 2023-10-09 ENCOUNTER — Other Ambulatory Visit: Payer: Self-pay | Admitting: Family Medicine

## 2023-10-09 DIAGNOSIS — M8580 Other specified disorders of bone density and structure, unspecified site: Secondary | ICD-10-CM

## 2023-10-13 DIAGNOSIS — G4733 Obstructive sleep apnea (adult) (pediatric): Secondary | ICD-10-CM | POA: Diagnosis not present

## 2023-11-04 DIAGNOSIS — L900 Lichen sclerosus et atrophicus: Secondary | ICD-10-CM | POA: Diagnosis not present

## 2023-11-04 DIAGNOSIS — N952 Postmenopausal atrophic vaginitis: Secondary | ICD-10-CM | POA: Diagnosis not present

## 2023-12-08 ENCOUNTER — Ambulatory Visit
Admission: RE | Admit: 2023-12-08 | Discharge: 2023-12-08 | Disposition: A | Discharge status: Home / Self Care | Source: Ambulatory Visit | Attending: Family Medicine | Admitting: Family Medicine

## 2023-12-08 DIAGNOSIS — M85852 Other specified disorders of bone density and structure, left thigh: Secondary | ICD-10-CM | POA: Diagnosis not present

## 2023-12-08 DIAGNOSIS — M8580 Other specified disorders of bone density and structure, unspecified site: Secondary | ICD-10-CM | POA: Insufficient documentation

## 2023-12-08 DIAGNOSIS — Z780 Asymptomatic menopausal state: Secondary | ICD-10-CM | POA: Insufficient documentation

## 2023-12-08 DIAGNOSIS — Z13820 Encounter for screening for osteoporosis: Secondary | ICD-10-CM | POA: Diagnosis not present

## 2023-12-08 DIAGNOSIS — M8589 Other specified disorders of bone density and structure, multiple sites: Secondary | ICD-10-CM | POA: Insufficient documentation

## 2023-12-08 DIAGNOSIS — M85851 Other specified disorders of bone density and structure, right thigh: Secondary | ICD-10-CM | POA: Diagnosis not present

## 2024-01-14 ENCOUNTER — Other Ambulatory Visit: Payer: Self-pay

## 2024-01-14 ENCOUNTER — Emergency Department
Admission: EM | Admit: 2024-01-14 | Discharge: 2024-01-14 | Disposition: A | Discharge status: Home / Self Care | Attending: Emergency Medicine | Admitting: Emergency Medicine

## 2024-01-14 ENCOUNTER — Emergency Department

## 2024-01-14 DIAGNOSIS — R110 Nausea: Secondary | ICD-10-CM | POA: Diagnosis not present

## 2024-01-14 DIAGNOSIS — I10 Essential (primary) hypertension: Secondary | ICD-10-CM | POA: Insufficient documentation

## 2024-01-14 DIAGNOSIS — R079 Chest pain, unspecified: Secondary | ICD-10-CM | POA: Diagnosis not present

## 2024-01-14 DIAGNOSIS — R0789 Other chest pain: Secondary | ICD-10-CM | POA: Diagnosis not present

## 2024-01-14 DIAGNOSIS — M129 Arthropathy, unspecified: Secondary | ICD-10-CM | POA: Diagnosis not present

## 2024-01-14 DIAGNOSIS — Z7901 Long term (current) use of anticoagulants: Secondary | ICD-10-CM | POA: Insufficient documentation

## 2024-01-14 DIAGNOSIS — I4891 Unspecified atrial fibrillation: Secondary | ICD-10-CM | POA: Diagnosis not present

## 2024-01-14 DIAGNOSIS — I2510 Atherosclerotic heart disease of native coronary artery without angina pectoris: Secondary | ICD-10-CM | POA: Insufficient documentation

## 2024-01-14 LAB — CBC
HCT: 37.4 % (ref 36.0–46.0)
Hemoglobin: 12 g/dL (ref 12.0–15.0)
MCH: 30 pg (ref 26.0–34.0)
MCHC: 32.1 g/dL (ref 30.0–36.0)
MCV: 93.5 fL (ref 80.0–100.0)
Platelets: 250 K/uL (ref 150–400)
RBC: 4 MIL/uL (ref 3.87–5.11)
RDW: 13 % (ref 11.5–15.5)
WBC: 6.1 K/uL (ref 4.0–10.5)
nRBC: 0 % (ref 0.0–0.2)

## 2024-01-14 LAB — BASIC METABOLIC PANEL WITH GFR
Anion gap: 8 (ref 5–15)
BUN: 19 mg/dL (ref 8–23)
CO2: 22 mmol/L (ref 22–32)
Calcium: 9.6 mg/dL (ref 8.9–10.3)
Chloride: 110 mmol/L (ref 98–111)
Creatinine, Ser: 0.88 mg/dL (ref 0.44–1.00)
GFR, Estimated: >60 mL/min (ref >60)
Glucose, Bld: 112 mg/dL — ABNORMAL HIGH (ref 70–99)
Potassium: 4 mmol/L (ref 3.5–5.1)
Sodium: 140 mmol/L (ref 135–145)

## 2024-01-14 LAB — TROPONIN I (HIGH SENSITIVITY)
Troponin I (High Sensitivity): 22 ng/L — ABNORMAL HIGH (ref <18)
Troponin I (High Sensitivity): 22 ng/L — ABNORMAL HIGH (ref <18)

## 2024-01-14 MED ORDER — NITROGLYCERIN 0.4 MG SL SUBL
0.4000 mg | SUBLINGUAL_TABLET | Freq: Once | SUBLINGUAL | Status: AC
  Administered 2024-01-14: 0.4 mg via SUBLINGUAL
  Filled 2024-01-14: qty 1

## 2024-01-14 NOTE — ED Triage Notes (Signed)
 Patient states that PTA she developed chest pain that radiated into right jaw; denies pain at this time but states she just feels weak.

## 2024-01-14 NOTE — ED Triage Notes (Signed)
 First Nurse Note:  Pt via [facility] from home. Pt c/o sudden onset of sharp jaw pain that radiated to the L chest, pain resolved before EMS got there. Also c/o nausea. Pt has a hx of afib. Pt is A&Ox4 and NAD   EMS reports 140/60, 60HR, 98% on RA. EMS gave 324 ASA and 22G L hand.

## 2024-01-14 NOTE — ED Provider Notes (Signed)
 [facility] Provider Note    Event Date/Time   First MD Initiated Contact with Patient 01/14/24 1520     (approximate)   History   Chief Complaint Chest Pain   HPI  Cheryl Velez is a 83 y.o. female with past medical history of hypertension, hyperlipidemia, atrial fibrillation on Eliquis , CAD, and cardiomyopathy who presents to the ED complaining of chest pain.  Patient reports that earlier this morning she initially had sharp pain in the center of her chest rating up towards her jaw.  This severe pain lasted for about 20 minutes before improving to a dull pressure that has remained constant since then.  Pain occurred at rest and she denies any associated difficulty breathing.  She is otherwise felt well recently with no fevers, cough, leg swelling or pain.  She denies any history of similar symptoms, does take Eliquis  for atrial fibrillation.     Physical Exam   Triage Vital Signs: ED Triage Vitals  Encounter Vitals Group     BP 01/14/24 1231 (!) 124/46     Girls Systolic BP Percentile --      Girls Diastolic BP Percentile --      Boys Systolic BP Percentile --      Boys Diastolic BP Percentile --      Pulse Rate 01/14/24 1231 (!) 59     Resp 01/14/24 1231 18     Temp 01/14/24 1231 (!) 97.5 F (36.4 C)     Temp Source 01/14/24 1231 Oral     SpO2 01/14/24 1231 96 %     Weight 01/14/24 1230 194 lb (88 kg)     Height 01/14/24 1230 5' 4 (1.626 m)     Head Circumference --      Peak Flow --      Pain Score 01/14/24 1229 0     Pain Loc --      Pain Education --      Exclude from Growth Chart --     Most recent vital signs: Vitals:   01/14/24 1530 01/14/24 1658  BP: (!) 127/54 134/62  Pulse: (!) 59 (!) 59  Resp: (!) 30 16  Temp:    SpO2: 95% 95%    Constitutional: Alert and oriented. Eyes: Conjunctivae are normal. Head: Atraumatic. Nose: No congestion/rhinnorhea. Mouth/Throat: Mucous membranes are moist.  Cardiovascular: Normal rate,  regular rhythm. Grossly normal heart sounds.  2+ radial pulses bilaterally. Respiratory: Normal respiratory effort.  No retractions. Lungs CTAB. Gastrointestinal: Soft and nontender. No distention. Musculoskeletal: No lower extremity tenderness nor edema.  Neurologic:  Normal speech and language. No gross focal neurologic deficits are appreciated.    ED Results / Procedures / Treatments   Labs (all labs ordered are listed, but only abnormal results are displayed) Labs Reviewed  BASIC METABOLIC PANEL WITH GFR - Abnormal; Notable for the following components:      Result Value   Glucose, Bld 112 (*)    All other components within normal limits  TROPONIN I (HIGH SENSITIVITY) - Abnormal; Notable for the following components:   Troponin I (High Sensitivity) 22 (*)    All other components within normal limits  TROPONIN I (HIGH SENSITIVITY) - Abnormal; Notable for the following components:   Troponin I (High Sensitivity) 22 (*)    All other components within normal limits  CBC     EKG  ED ECG REPORT I, Carlin Palin, the attending physician, personally viewed and interpreted this ECG.   Date: 01/14/2024  EKG Time: 12:36  Rate: 59  Rhythm: sinus bradycardia  Axis: Normal  Intervals:none  ST&T Change: Inferolateral T wave inversions  ED ECG REPORT I, Carlin Palin, the attending physician, personally viewed and interpreted this ECG.   Date: 01/14/2024  EKG Time: 16:50  Rate: 60  Rhythm: normal sinus rhythm  Axis: Normal  Intervals:none  ST&T Change: Diffuse T wave inversions   RADIOLOGY Chest x-ray reviewed and interpreted by me with no infiltrate, edema, or effusion.  PROCEDURES:  Critical Care performed: No  Procedures   MEDICATIONS ORDERED IN ED: Medications  nitroGLYCERIN  (NITROSTAT ) SL tablet 0.4 mg (0.4 mg Sublingual Given 01/14/24 1706)     IMPRESSION / MDM / ASSESSMENT AND PLAN / ED COURSE  I reviewed the triage vital signs and the nursing notes.                               83 y.o. female with past medical history of hypertension, hyperlipidemia, atrial fibrillation on Eliquis , CAD, and cardiomyopathy who presents to the ED complaining of pain in her chest since this morning, now improved but still present.  Patient's presentation is most consistent with acute presentation with potential threat to life or bodily function.  Differential diagnosis includes, but is not limited to, ACS, PE, pneumonia, pneumothorax, musculoskeletal pain, GERD, anxiety.  Patient nontoxic-appearing and in no acute distress, vital signs remarkable for borderline bradycardia but otherwise reassuring.  EKG shows diffuse T wave inversions, but appears similar compared to previous.  Troponin mildly elevated at 22, but stable on repeat.  Chest x-ray is unremarkable and additional labs without significant anemia, leukocytosis, electrolyte abnormality, or AKI.  Patient given loading dose of aspirin  prior to arrival, will treat ongoing pain with nitroglycerin .  Chest pain has resolved, repeat EKG similar to previous.  Overall suspicion for ACS is quite low given atypical symptoms with reassuring troponins, but patient was offered admission to the hospital given elevated heart score.  She declines, which is reasonable given her reassuring workup and resolution of symptoms.  We will discharge home with referral back to cardiology, she was counseled to return to the ED for new or worsening symptoms.  Patient agrees with plan.      FINAL CLINICAL IMPRESSION(S) / ED DIAGNOSES   Final diagnoses:  Nonspecific chest pain     Rx / DC Orders   ED Discharge Orders          Ordered    Ambulatory referral to Cardiology        01/14/24 1736             Note:  This document was prepared using Dragon voice recognition software and may include unintentional dictation errors.   Palin Carlin, MD 01/14/24 [phone]

## 2024-01-14 NOTE — ED Notes (Signed)
 Patient up to toilet in room without incident.

## 2024-01-15 DIAGNOSIS — R079 Chest pain, unspecified: Secondary | ICD-10-CM | POA: Diagnosis not present

## 2024-01-19 NOTE — Progress Notes (Unsigned)
 Cardiology Office Note    Date:  01/20/2024   ID:  Cheryl Velez, Cheryl Velez 05/19/1940, MRN [id number]  PCP:  Teresa Channel, MD  Cardiologist:  Lonni Hanson, MD  Electrophysiologist:  None   Chief Complaint: ED follow-up  History of Present Illness:   Cheryl Velez is a 83 y.o. female with history of apical hypertrophic cardiomyopathy, PAF diagnosed in 11/2022 on apixaban , HTN, HLD, obstructive sleep apnea, hypothyroidism, osteoarthritis, PTSD, pulmonary nodules, and GERD who presents for ED follow-up of chest pain.  Nuclear stress test in 12/2012 showed no evidence of reversible/inducible ischemia with an EF of 61%.  LHC in 05/2017 showed widely patent coronary arteries with minor nonobstructive CAD.  There was vigorous LV systolic function with an EF greater than 65% and apical hypertrophy noted.  Medical management was advised.  Echo in 06/2017 showed an EF of 55 to 60%, moderate concentric LVH, no regional wall motion abnormalities, grade 2 diastolic dysfunction, trivial aortic insufficiency, trivial mitral regurgitation, mildly dilated left atrium, normal RV systolic function and ventricular cavity size, and an estimated PASP of 31 mmHg.  Holter monitor in 07/2017 showed a predominant rhythm of sinus with an average rate of 64 (range 49 to 106 bpm), rare PACs and PVCs, and no sustained arrhythmias or prolonged pauses.  Regarding apical HCM, she was evaluated by genetic counseling with genetic testing not recommended.  She was felt to have a benign variant of apical hypertrophic cardiomyopathy.   She was seen in the ED on 11/14/2020 with chest tightness.  Work-up was largely unrevealing, though high-sensitivity troponin was borderline elevated with an initial value of 17 and a delta of 22.  D-dimer was negative.  EKG demonstrated stable ST-T findings consistent with apical hypertrophic cardiomyopathy.  She was discharged on Imdur  at request of Dr. Orinda.  She was seen in the office on 12/05/2020 and  indicated she had been feeling better.  She did continue to note occasional vague chest discomfort, though was improved from the sharp chest pain and chest pressure that led to her ED visit in 10/2020.  In hindsight, she wondered if having stopped famotidine prior to the onset of chest pain contributed to her symptoms.  She also noted occasional brief palpitations that were not associated with her above chest pain.  Given resolution of symptoms, she preferred to defer further ischemic evaluation at that time with recommendation to continue medical management.  She underwent carotid artery ultrasound in 01/2022 which demonstrated no evidence of stenosis in the bilateral ICAs with antegrade flow of the bilateral vertebral arteries and normal flow hemodynamics of the bilateral subclavian arteries.   She was admitted to the hospital in 11/2022 with intermittent episodes of chest tightness and palpitations and was found to be in new onset A-fib with RVR with ventricular rates in the 120s bpm.  High-sensitivity troponin peaked at 189.  BNP 598.  She spontaneously converted to sinus rhythm in the ED.  Echo showed an EF of 50 to 55%, select images concerning for periapical hypokinesis - unable to exclude stress-induced cardiomyopathy, moderate concentric LVH, normal RV systolic function, ventricular cavity size, and PASP, mild mitral regurgitation, and an estimated right atrial pressure of 3 mmHg.  She was initiated on apixaban .   She was seen in hospital follow-up in 12/2022 and was without further tachypalpitations.  She did note some randomly occurring pinpoint chest discomfort that would last for a split-second and self resolved.  She was adherent and tolerating OAC.  She was  in sinus rhythm with a mildly bradycardic rate.  Lopressor  was reduced to 12.5 mg twice daily.  Lexiscan  MPI in 12/2022 showed no evidence of ischemia or scar with an EF of 65%.  Overall, this was a low risk study.  Coronary artery calcification  and aortic atherosclerosis were noted.  In addition, patchy groundglass opacities were evident in the visualized lung fields.  Subsequent CT of the chest in 01/2023 showed no acute abnormality within the chest along with several small pulmonary nodules bilaterally with the largest nodule measuring 4 mm.  She was last seen in the office in 05/2023 and was doing well from a cardiac perspective.  She was maintaining sinus rhythm.  No changes in cardiac pharmacotherapy were indicated at that time.  She was seen in the ED on 01/14/2024 with centralized chest pain and radiating towards her jaw lasting for approximately 20 minutes with subsequent improvement to a dull pressure that remained constant.  Symptoms occurred while at rest.  EKG showed sinus rhythm with nonspecific ST-T changes consistent with prior tracings and baseline wandering.  High-sensitivity troponin 22 with a delta troponin of 22.  Chest x-ray showed no acute findings with incidental chronic findings as noted in the report.  She had symptomatic improvement with nitroglycerin .  She declined hospital admission with recommendation to follow-up as outpatient.  She comes in today reporting an episode of severe sharp jaw pain with associated substernal chest pressure that began at rest after breakfast on 9/18 lasting for approximately 45 minutes.  She has had similar jaw pain in the past, though not as severe.  This was followed by brief split-second sharp episodes of chest discomfort that she has had for many years.  No sensation of palpitations during this episode.  No presyncope, or syncope.  No exertional symptoms leading up to event.  No falls or symptoms concerning for bleeding.  No lower extremity swelling or progressive orthopnea.  She has been without similar symptoms since ED evaluation.  Currently without symptoms of angina or cardiac decompensation.   Labs independently reviewed: 12/2023 - Hgb 12.0, PLT 250, potassium 4.0, BUN 19, serum  creatinine 0.88 11/2022 - TC 137, TG 105, HDL 34, LDL 82, TSH normal 07/2022 - magnesium  2.0 10/2020 - albumin 3.9, AST/ALT normal  Past Medical History:  Diagnosis Date   Adenomatous colon polyp    Arthritis    Chest pain 01/06/2013   Chest pain    GI (gastrointestinal bleed)    DR. DIANNA, ADENOMATOUS POLYP 6/14, 3 YEAR FOLLOW UP, 9/17, 3 YEAR FOLLOW UP.   Hearing loss    BIATERAL HEARING AIDS, DR. LATANYA   Hypercholesterolemia    Hyperlipidemia    Hypertension    DX 8/16   Hypothyroidism    OSA (obstructive sleep apnea)    (PSG 03/21/12 ESS 6, AHI 14/hr REM 44/hr, RDI SAME, O2 min 77% APAP STARTED 12/15), DR. TAMMY   Osteoarthritis    KNEES, HANDS HAS SEEN [name]   PTSD (post-traumatic stress disorder)    Thyroid  disease     Past Surgical History:  Procedure Laterality Date   CARDIAC CATHETERIZATION     KNEE SURGERY     LEFT HEART CATH AND CORONARY ANGIOGRAPHY N/A 06/23/2017   Procedure: LEFT HEART CATH AND CORONARY ANGIOGRAPHY;  Surgeon: Wonda Sharper, MD;  Location: [facility] INVASIVE CV LAB;  Service: Cardiovascular;  Laterality: N/A;    Current Medications: Current Meds  Medication Sig   albuterol  (VENTOLIN  HFA) 108 (90 Base) MCG/ACT inhaler Inhale  1-2 puffs into the lungs every 6 (six) hours as needed.   b complex vitamins tablet Take 1 tablet by mouth at bedtime. SUPER B COMPLEX   Calcium  Carb-Cholecalciferol  (CALCIUM  600+D3 PO) Take 1 tablet by mouth at bedtime.   Cholecalciferol  (VITAMIN D3) 2000 units TABS Take 2,000 Units by mouth at bedtime.   cholestyramine  light (PREVALITE ) 4 g packet Take 4 g by mouth 2 (two) times daily.   ELIQUIS  5 MG TABS tablet TAKE 1 TABLET TWICE A DAY   fenofibrate  160 MG tablet Take 160 mg by mouth at bedtime.    hyoscyamine  ([name] ) 0.125 MG tablet Take 0.125 mg by mouth every 4 (four) hours as needed for cramping.   isosorbide  mononitrate (IMDUR ) 30 MG 24 hr tablet Take 1 tablet (30 mg total) by mouth daily.   ivabradine   (CORLANOR) 5 MG TABS tablet Take 2 tablets (10 mg total) by mouth once for 1 dose. Take approximately 2 hours prior to your Cardiac CTA.   levothyroxine  (SYNTHROID , LEVOTHROID) 75 MCG tablet Take 75 mcg by mouth daily at 2 am.    losartan  (COZAAR ) 50 MG tablet Take 1 tablet (50 mg total) by mouth at bedtime.   metoprolol  tartrate (LOPRESSOR ) 25 MG tablet Take 0.5 tablets (12.5 mg total) by mouth 2 (two) times daily.   Multiple Vitamin (MULTIVITAMIN WITH MINERALS) TABS tablet Take 1 tablet by mouth at bedtime.    pantoprazole  (PROTONIX ) 40 MG tablet Take 40 mg by mouth daily.    Allergies:   Lipitor [atorvastatin], Paxil [paroxetine hcl], Shellfish allergy, Latex, Nickel, and Other   Social History   Socioeconomic History   Marital status: Married    Spouse name: Hydrologist   Number of children: 3   Years of education: Not on file   Highest education level: Not on file  Occupational History   Occupation: RETIRED  Tobacco Use   Smoking status: Never   Smokeless tobacco: Never  Vaping Use   Vaping status: Never Used  Substance and Sexual Activity   Alcohol use: Yes    Comment: occassional glass of wine   Drug use: No   Sexual activity: Not on file  Other Topics Concern   Not on file  Social History Narrative   Not on file   Social Drivers of Health   Financial Resource Strain: Not on file  Food Insecurity: No Food Insecurity (12/23/2022)   Hunger Vital Sign    Worried About Running Out of Food in the Last Year: Never true    Ran Out of Food in the Last Year: Never true  Transportation Needs: No Transportation Needs (12/23/2022)   PRAPARE - Administrator, Civil Service (Medical): No    Lack of Transportation (Non-Medical): No  Physical Activity: Not on file  Stress: Not on file  Social Connections: Not on file     Family History:  The patient's family history includes Alcohol abuse in her sister; Blindness in her brother; Breast cancer in her paternal  grandmother; CAD in her father, maternal grandfather, maternal grandmother, paternal grandfather, and paternal grandmother; COPD in her sister; CVA in her brother; Cirrhosis in her father; Colon polyps in her mother; Diabetes (age of onset: 35) in her brother; Diverticulitis in her sister; Gout in her sister; Heart Problems in her brother; Heart Problems (age of onset: 60) in her brother; Heart attack in her brother; Hypertension in her brother, brother, father, mother, sister, and sister; Hypertension (age of onset: 49) in her brother; Obesity  in her sister; Other in her brother; Polycythemia in her sister; Stroke in her mother; Suicidality in her sister.  ROS:   12-point review of systems is negative unless otherwise noted in the HPI.   EKGs/Labs/Other Studies Reviewed:    Studies reviewed were summarized above. The additional studies were reviewed today:  Lexiscan  MPI 01/13/2023:   Normal pharmacologic myocardial perfusion stress test without evidence of significant ischemia or scar.   Left ventricular systolic function is normal (LVEF 65%).   Coronary artery calcification and aortic atherosclerosis are noted.   Patchy groundglass opacities are evident in the visualized lungs.  Consider dedicated chest CT for further evaluation as clinically indicated.   This is a low risk study. __________   2D echo 12/24/2022: 1. Left ventricular ejection fraction, by estimation, is 50 to 55%. The  left ventricle has low normal function. The left ventricle demonstrates  regional wall motion abnormalities (Select images concerning for  periapical hypokinesis, unable to exclude  stress cardiomyopathy). There is moderate concentric left ventricular  hypertrophy. Left ventricular diastolic parameters are indeterminate.   2. Right ventricular systolic function is normal. The right ventricular  size is normal. There is normal pulmonary artery systolic pressure. The  estimated right ventricular systolic  pressure is 20.8 mmHg.   3. The mitral valve is normal in structure. Mild mitral valve  regurgitation. No evidence of mitral stenosis.   4. The aortic valve has an indeterminant number of cusps. Aortic valve  regurgitation is not visualized. No aortic stenosis is present.   5. The inferior vena cava is normal in size with greater than 50%  respiratory variability, suggesting right atrial pressure of 3 mmHg.  __________   Carotid artery ultrasound 02/20/2022: Summary:  Right Carotid: There is no evidence of stenosis in the right ICA.   Left Carotid: There is no evidence of stenosis in the left ICA.   Vertebrals:  Bilateral vertebral arteries demonstrate antegrade flow.  Subclavians: Normal flow hemodynamics were seen in bilateral subclavian arteries.  __________   48-hour Holter 07/2017: The patient was monitored for 48 hours. The predominant rhythm was sinus with an average rate of 64 bpm (range 49-106 bpm). The longest R-R interval was 1.4 seconds. Rare PAC's and PVC's were observed. No sustained arrhythmia or prolonged pause was seen. Patient diary events correspond to sinus rhythm.   Predominantly sinus rhythm with rare PAC's and PVC's.  No significant arrhythmias. __________   2D echo 07/06/2017: - Left ventricle: The cavity size was normal. There was moderate    concentric hypertrophy. Systolic function was normal. The    estimated ejection fraction was in the range of 55% to 60%. Wall    motion was normal; there were no regional wall motion    abnormalities. Features are consistent with a pseudonormal left    ventricular filling pattern, with concomitant abnormal relaxation    and increased filling pressure (grade 2 diastolic dysfunction).    Doppler parameters are consistent with high ventricular filling    pressure.  - Aortic valve: Transvalvular velocity was within the normal range.    There was no stenosis. There was trivial regurgitation.  - Mitral valve:  Transvalvular velocity was within the normal range.    There was no evidence for stenosis. There was trivial    regurgitation.  - Left atrium: The atrium was mildly dilated.  - Right ventricle: The cavity size was normal. Wall thickness was    normal. Systolic function was normal.  - Atrial septum: No  defect or patent foramen ovale was identified    by color flow Doppler.  - Tricuspid valve: There was mild regurgitation.  - Pulmonary arteries: Systolic pressure was within the normal    range. PA peak pressure: 31 mm Hg (S). __________   LHC 06/23/2017: 1. Widely patent coronary arteries with minor nonobstructive CAD  2. Vigorous LV function with LVEF >65% and apical hypertrophy noted 3. Moderately elevated LVEDP   Recommend: medical management __________   Nuclear stress test 01/08/2013: 1.  No evidence of reversible/inducible ischemia.  2.  Normal ventricular wall motion.  3.  Calculated ejection fraction 61%.   EKG:  EKG is ordered today.  The EKG ordered today demonstrates NSR, 63 bpm, anterolateral ST-T changes consistent with prior tracings  Recent Labs: 01/14/2024: BUN 19; Creatinine, Ser 0.88; Hemoglobin 12.0; Platelets 250; Potassium 4.0; Sodium 140  Recent Lipid Panel    Component Value Date/Time   CHOL 137 12/24/2022 0852   TRIG 105 12/24/2022 0852   HDL 34 (L) 12/24/2022 0852   CHOLHDL 4.0 12/24/2022 0852   VLDL 21 12/24/2022 0852   LDLCALC 82 12/24/2022 0852    PHYSICAL EXAM:    VS:  BP (!) 118/45 (BP Location: Left Arm, Patient Position: Sitting, Cuff Size: Large)   Pulse 63   Ht 5' 4 (1.626 m)   Wt 200 lb 12.8 oz (91.1 kg)   SpO2 98%   BMI 34.47 kg/m   BMI: Body mass index is 34.47 kg/m.  Physical Exam Vitals reviewed.  Constitutional:      Appearance: She is well-developed.  HENT:     Head: Normocephalic and atraumatic.  Eyes:     General:        Right eye: No discharge.        Left eye: No discharge.  Cardiovascular:     Rate and Rhythm:  Normal rate and regular rhythm.     Heart sounds: Normal heart sounds, S1 normal and S2 normal. Heart sounds not distant. No midsystolic click and no opening snap. No murmur heard.    No friction rub.  Pulmonary:     Effort: Pulmonary effort is normal. No respiratory distress.     Breath sounds: Normal breath sounds. No decreased breath sounds, wheezing, rhonchi or rales.  Musculoskeletal:     Cervical back: Normal range of motion.     Right lower leg: No edema.     Left lower leg: No edema.  Skin:    General: Skin is warm and dry.     Nails: There is no clubbing.  Neurological:     Mental Status: She is alert and oriented to person, place, and time.  Psychiatric:        Speech: Speech normal.        Behavior: Behavior normal.        Thought Content: Thought content normal.        Judgment: Judgment normal.     Wt Readings from Last 3 Encounters:  01/20/24 200 lb 12.8 oz (91.1 kg)  01/14/24 194 lb (88 kg)  06/11/23 187 lb (84.8 kg)     ASSESSMENT & PLAN:   Coronary artery calcification with precordial pain: Currently without symptoms of angina or cardiac decompensation.  Prior LHC in 2019 showed no significant CAD with Lexiscan  MPI in 12/2022 showing no evidence of ischemia or scar.  Recently evaluated in the ED for concern times concerning for angina with mildly elevated and flat trending high-sensitivity troponin.  It does not  sound like her episode was associated with arrhythmia burden with known apical hypertrophic cardiomyopathy contributing to symptoms.  We discussed invasive versus noninvasive ischemic testing and have agreed to proceed with coronary CTA.  Obtain carotid artery ultrasound.  On apixaban  in lieu of aspirin .  Otherwise, remains on fenofibrate  160 mg, Imdur  30 mg, and Lopressor  12.5 mg twice daily.  PAF: Maintaining sinus rhythm on Lopressor  12.5 mg twice daily.  No symptoms of tachypalpitations associated with episode of chest discomfort.  CHA2DS2-VASc at least 5  (HTN, age x 2, vascular disease, sex category).  Continue apixaban  5 mg twice daily, does not meet reduced dosing criteria.  No falls or symptoms concerning for bleeding.  Recent labs stable.  Apical hypertrophic cardiomyopathy: No presyncope or syncope.  No high risk features.  Prior outpatient cardiac monitoring showed isolated PACs and PVCs with no NSVT.  Evaluated for genetic counseling and was felt to have a benign variant of apical hypertrophic cardiomyopathy with genetic testing not recommended.  Remains on metoprolol  as outlined above.   Aortic atherosclerosis/HLD with high intensity statin intolerance, and hypertriglyceridemia: Normal LDL 82 in 11/2022.  Remains on fenofibrate  160 mg daily.  HTN: Blood pressure is well-controlled in the office today.  She remains on losartan  50 mg, Lopressor  12.5 mg twice daily, and Imdur  30 mg.  Pulmonary nodules: Anticipate ordering follow-up chest CT at next visit.  This was not discussed at today's visit given need to discuss ED follow-up.      Disposition: F/u with Dr. Mady or an APP in 4-6 weeks.   Medication Adjustments/Labs and Tests Ordered: Current medicines are reviewed at length with the patient today.  Concerns regarding medicines are outlined above. Medication changes, Labs and Tests ordered today are summarized above and listed in the Patient Instructions accessible in Encounters.   Signed, Bernardino Bring, PA-C 01/20/2024 1:07 PM     [facility] [place] [address] [place], [place] [address] [phone]

## 2024-01-20 ENCOUNTER — Ambulatory Visit: Attending: Physician Assistant | Admitting: Physician Assistant

## 2024-01-20 ENCOUNTER — Other Ambulatory Visit: Payer: Self-pay

## 2024-01-20 ENCOUNTER — Encounter: Payer: Self-pay | Admitting: Physician Assistant

## 2024-01-20 VITALS — BP 118/45 | HR 63 | Ht 64.0 in | Wt 200.8 lb

## 2024-01-20 DIAGNOSIS — I10 Essential (primary) hypertension: Secondary | ICD-10-CM | POA: Insufficient documentation

## 2024-01-20 DIAGNOSIS — R911 Solitary pulmonary nodule: Secondary | ICD-10-CM | POA: Diagnosis not present

## 2024-01-20 DIAGNOSIS — I422 Other hypertrophic cardiomyopathy: Secondary | ICD-10-CM | POA: Insufficient documentation

## 2024-01-20 DIAGNOSIS — R42 Dizziness and giddiness: Secondary | ICD-10-CM | POA: Diagnosis not present

## 2024-01-20 DIAGNOSIS — Z79899 Other long term (current) drug therapy: Secondary | ICD-10-CM | POA: Insufficient documentation

## 2024-01-20 DIAGNOSIS — Z789 Other specified health status: Secondary | ICD-10-CM | POA: Insufficient documentation

## 2024-01-20 DIAGNOSIS — R072 Precordial pain: Secondary | ICD-10-CM | POA: Insufficient documentation

## 2024-01-20 DIAGNOSIS — I2510 Atherosclerotic heart disease of native coronary artery without angina pectoris: Secondary | ICD-10-CM | POA: Insufficient documentation

## 2024-01-20 DIAGNOSIS — E782 Mixed hyperlipidemia: Secondary | ICD-10-CM | POA: Insufficient documentation

## 2024-01-20 DIAGNOSIS — I480 Paroxysmal atrial fibrillation: Secondary | ICD-10-CM | POA: Diagnosis not present

## 2024-01-20 DIAGNOSIS — I700 Atherosclerosis of aorta: Secondary | ICD-10-CM | POA: Diagnosis not present

## 2024-01-20 MED ORDER — IVABRADINE HCL 5 MG PO TABS
10.0000 mg | ORAL_TABLET | Freq: Once | ORAL | 0 refills | Status: AC
  Filled 2024-01-20: qty 2, 1d supply, fill #0

## 2024-01-20 NOTE — Patient Instructions (Addendum)
 Medication Instructions:  Your physician recommends that you continue on your current medications as directed. Please refer to the Current Medication list given to you today.   *If you need a refill on your cardiac medications before your next appointment, please call your pharmacy*  Lab Work: Your provider would like for you to have following labs drawn today BMP.   If you have labs (blood work) drawn today and your tests are completely normal, you will receive your results only by: MyChart Message (if you have MyChart) OR A paper copy in the mail If you have any lab test that is abnormal or we need to change your treatment, we will call you to review the results.  Testing/Procedures:   Your cardiac CT will be scheduled at one of the below locations:   [facility] [address] [place], [place] [address] [phone] (Severe contrast allergies only)  OR   [facility] [address] [place], [place] [address] [phone]  OR   [facility] [address]  [place], [place] [address] [phone]  If scheduled at [facility], please arrive at the [facility] and Children's Entrance (Entrance C2) of [facility] 30 minutes prior to test start time. You can use the FREE valet parking offered at entrance C (encouraged to control the heart rate for the test)  Proceed to the [facility] Radiology Department (first floor) to check-in and test prep.  All radiology patients and guests should use entrance C2 at [facility], accessed from [facility], even though the hospital's physical address listed is [address]  If scheduled at the Heart and Vascular Tower at [address], please enter the parking lot using the [address] entrance and use the FREE valet service at the patient drop-off area. Enter the building and check-in with registration on the main floor.  If  scheduled at [facility], please arrive 15 mins early for check-in and test prep - valet is available to this site as well.  Please follow these instructions carefully (unless otherwise directed):  An IV will be required for this test and Nitroglycerin  will be given.  Hold all your Imdur  or the day of your test.  On the Night Before the Test: Be sure to Drink plenty of water. Do not consume any caffeinated/decaffeinated beverages or chocolate 12 hours prior to your test. Do not take any antihistamines 12 hours prior to your test.  On the Day of the Test: Drink plenty of water until 1 hour prior to the test. Do not eat any food 1 hour prior to test. You may take your regular medications prior to the test.  Take Corlanor two hours prior to test. If you take Furosemide /Hydrochlorothiazide/Spironolactone/Chlorthalidone, please HOLD on the morning of the test. Patients who wear a continuous glucose monitor MUST remove the device prior to scanning. FEMALES- please wear underwire-free bra if available, avoid dresses & tight clothing      After the Test: Drink plenty of water. After receiving IV contrast, you may experience a mild flushed feeling. This is normal. On occasion, you may experience a mild rash up to 24 hours after the test. This is not dangerous. If this occurs, you can take Benadryl 25 mg, Zyrtec, Claritin, or Allegra and increase your fluid intake. (Patients taking Tikosyn should avoid Benadryl, and may take Zyrtec, Claritin, or Allegra) If you experience trouble breathing, this can be serious.  If it is severe call 911 IMMEDIATELY. If it is mild, please call our office.  We will call to schedule your test 2-4 weeks out understanding that some insurance companies will need an authorization prior to the service being performed.   For more information and frequently asked questions, please visit our website : [email or web address]  For  non-scheduling related questions, please contact the cardiac imaging nurse navigator should you have any questions/concerns: Cardiac Imaging Nurse Navigators Direct Office Dial: [phone]   For scheduling needs, including cancellations and rescheduling, please call [place], [phone].  Your physician has requested that you have a carotid duplex. This test is an ultrasound of the carotid arteries in your neck. It looks at blood flow through these arteries that supply the brain with blood.   Allow one hour for this exam.  There are no restrictions or special instructions.  This will take place at [address] [address], [place] OR in the Medical Mall  Follow-Up: At [facility], you and your health needs are our priority.  As part of our continuing mission to provide you with exceptional heart care, our providers are all part of one team.  This team includes your primary Cardiologist (physician) and Advanced Practice Providers or APPs (Physician Assistants and Nurse Practitioners) who all work together to provide you with the care you need, when you need it.  Your next appointment:   4-6 week(s)  Provider:   You may see Lonni Hanson, MD or Bernardino Bring, PA-C  We recommend signing up for the patient portal called MyChart.  Sign up information is provided on this After Visit Summary.  MyChart is used to connect with patients for Virtual Visits (Telemedicine).  Patients are able to view lab/test results, encounter notes, upcoming appointments, etc.  Non-urgent messages can be sent to your provider as well.   To learn more about what you can do with MyChart, go to [email or web address].   Other Instructions Heart Healthy diet print out given

## 2024-01-21 ENCOUNTER — Ambulatory Visit: Payer: Self-pay | Admitting: Physician Assistant

## 2024-01-21 DIAGNOSIS — E782 Mixed hyperlipidemia: Secondary | ICD-10-CM

## 2024-01-21 DIAGNOSIS — Z79899 Other long term (current) drug therapy: Secondary | ICD-10-CM

## 2024-01-21 DIAGNOSIS — E781 Pure hyperglyceridemia: Secondary | ICD-10-CM

## 2024-01-21 LAB — BASIC METABOLIC PANEL WITH GFR
BUN/Creatinine Ratio: 19 (ref 12–28)
BUN: 20 mg/dL (ref 8–27)
CO2: 21 mmol/L (ref 20–29)
Calcium: 10.4 mg/dL — ABNORMAL HIGH (ref 8.7–10.3)
Chloride: 106 mmol/L (ref 96–106)
Creatinine, Ser: 1.05 mg/dL — ABNORMAL HIGH (ref 0.57–1.00)
Glucose: 106 mg/dL — ABNORMAL HIGH (ref 70–99)
Potassium: 4.9 mmol/L (ref 3.5–5.2)
Sodium: 141 mmol/L (ref 134–144)
eGFR: 53 mL/min/1.73 — ABNORMAL LOW (ref >59)

## 2024-01-25 ENCOUNTER — Ambulatory Visit (HOSPITAL_COMMUNITY)
Admission: RE | Admit: 2024-01-25 | Discharge: 2024-01-25 | Disposition: A | Discharge status: Home / Self Care | Source: Ambulatory Visit | Attending: Physician Assistant | Admitting: Physician Assistant

## 2024-01-25 DIAGNOSIS — R42 Dizziness and giddiness: Secondary | ICD-10-CM | POA: Insufficient documentation

## 2024-01-26 ENCOUNTER — Telehealth (HOSPITAL_COMMUNITY): Payer: Self-pay | Admitting: *Deleted

## 2024-01-26 NOTE — Telephone Encounter (Signed)
 Reaching out to patient to offer assistance regarding upcoming cardiac imaging study; pt verbalizes understanding of appt date/time, parking situation and where to check in, pre-test NPO status, and verified current allergies; name and call back number provided for further questions should they arise  Cheryl Brick RN Navigator Cardiac Imaging [facility] [phone] office [phone] cell

## 2024-01-27 ENCOUNTER — Ambulatory Visit (HOSPITAL_COMMUNITY)
Admission: RE | Admit: 2024-01-27 | Discharge: 2024-01-27 | Disposition: A | Discharge status: Home / Self Care | Source: Ambulatory Visit | Attending: Physician Assistant | Admitting: Physician Assistant

## 2024-01-27 DIAGNOSIS — R072 Precordial pain: Secondary | ICD-10-CM | POA: Diagnosis not present

## 2024-01-27 MED ORDER — IOHEXOL 350 MG/ML SOLN
100.0000 mL | Freq: Once | INTRAVENOUS | Status: AC | PRN
  Administered 2024-01-27: 100 mL via INTRAVENOUS

## 2024-01-27 MED ORDER — NITROGLYCERIN 0.4 MG SL SUBL
0.8000 mg | SUBLINGUAL_TABLET | Freq: Once | SUBLINGUAL | Status: AC
  Administered 2024-01-27: 0.8 mg via SUBLINGUAL

## 2024-02-02 DIAGNOSIS — E782 Mixed hyperlipidemia: Secondary | ICD-10-CM | POA: Diagnosis not present

## 2024-02-02 DIAGNOSIS — E781 Pure hyperglyceridemia: Secondary | ICD-10-CM | POA: Diagnosis not present

## 2024-02-02 DIAGNOSIS — Z79899 Other long term (current) drug therapy: Secondary | ICD-10-CM | POA: Diagnosis not present

## 2024-02-03 DIAGNOSIS — N952 Postmenopausal atrophic vaginitis: Secondary | ICD-10-CM | POA: Diagnosis not present

## 2024-02-03 DIAGNOSIS — L900 Lichen sclerosus et atrophicus: Secondary | ICD-10-CM | POA: Diagnosis not present

## 2024-02-03 LAB — ALT: ALT: 11 IU/L (ref 0–32)

## 2024-02-03 LAB — LIPID PANEL
Chol/HDL Ratio: 4.4 ratio (ref 0.0–4.4)
Cholesterol, Total: 167 mg/dL (ref 100–199)
HDL: 38 mg/dL — ABNORMAL LOW (ref >39)
LDL Chol Calc (NIH): 97 mg/dL (ref 0–99)
Triglycerides: 185 mg/dL — ABNORMAL HIGH (ref 0–149)
VLDL Cholesterol Cal: 32 mg/dL (ref 5–40)

## 2024-02-03 LAB — AST: AST: 18 IU/L (ref 0–40)

## 2024-02-18 ENCOUNTER — Other Ambulatory Visit

## 2024-02-22 ENCOUNTER — Ambulatory Visit: Admitting: Physician Assistant

## 2024-02-22 DIAGNOSIS — I422 Other hypertrophic cardiomyopathy: Secondary | ICD-10-CM | POA: Diagnosis not present

## 2024-02-22 DIAGNOSIS — I2510 Atherosclerotic heart disease of native coronary artery without angina pectoris: Secondary | ICD-10-CM | POA: Diagnosis not present

## 2024-02-22 DIAGNOSIS — I10 Essential (primary) hypertension: Secondary | ICD-10-CM | POA: Diagnosis not present

## 2024-02-22 DIAGNOSIS — E039 Hypothyroidism, unspecified: Secondary | ICD-10-CM | POA: Diagnosis not present

## 2024-02-22 DIAGNOSIS — Z23 Encounter for immunization: Secondary | ICD-10-CM | POA: Diagnosis not present

## 2024-02-22 DIAGNOSIS — K529 Noninfective gastroenteritis and colitis, unspecified: Secondary | ICD-10-CM | POA: Diagnosis not present

## 2024-02-22 DIAGNOSIS — I480 Paroxysmal atrial fibrillation: Secondary | ICD-10-CM | POA: Diagnosis not present

## 2024-02-22 DIAGNOSIS — E669 Obesity, unspecified: Secondary | ICD-10-CM | POA: Diagnosis not present

## 2024-02-22 DIAGNOSIS — E785 Hyperlipidemia, unspecified: Secondary | ICD-10-CM | POA: Diagnosis not present

## 2024-02-24 ENCOUNTER — Other Ambulatory Visit: Payer: Self-pay | Admitting: Physician Assistant

## 2024-02-25 MED ORDER — ISOSORBIDE MONONITRATE ER 30 MG PO TB24: 30.0000 mg | ORAL_TABLET | Freq: Every day | ORAL | 3 refills | Status: AC

## 2024-02-29 ENCOUNTER — Ambulatory Visit: Admitting: Physician Assistant

## 2024-03-02 ENCOUNTER — Encounter: Payer: Self-pay | Admitting: Internal Medicine

## 2024-03-02 ENCOUNTER — Ambulatory Visit: Attending: Internal Medicine | Admitting: Internal Medicine

## 2024-03-02 VITALS — BP 120/60 | HR 70 | Ht 64.0 in | Wt 203.4 lb

## 2024-03-02 DIAGNOSIS — I2510 Atherosclerotic heart disease of native coronary artery without angina pectoris: Secondary | ICD-10-CM | POA: Diagnosis not present

## 2024-03-02 DIAGNOSIS — I10 Essential (primary) hypertension: Secondary | ICD-10-CM | POA: Insufficient documentation

## 2024-03-02 DIAGNOSIS — I422 Other hypertrophic cardiomyopathy: Secondary | ICD-10-CM | POA: Insufficient documentation

## 2024-03-02 DIAGNOSIS — I480 Paroxysmal atrial fibrillation: Secondary | ICD-10-CM | POA: Insufficient documentation

## 2024-03-02 NOTE — Patient Instructions (Signed)

## 2024-03-02 NOTE — Progress Notes (Unsigned)
 Cardiology Office Note:  .   Date:  03/03/2024  ID:  Cheryl Velez, DOB [birth year], MRN [id number] PCP: Teresa Channel, MD  [facility] Providers Cardiologist:  Lonni Hanson, MD     History of Present Illness: .   Cheryl Velez is a 83 y.o. female with history of apical hypertrophic cardiomyopathy, PAF diagnosed in 11/2022 on apixaban , HTN, HLD, obstructive sleep apnea, hypothyroidism, osteoarthritis, PTSD, pulmonary nodules, and GERD, who presents for follow-up of chest pain.  She presented to the ER in mid September with chest pain.  Workup showed stable EKG findings with nonspecific ST/T abnormalities.  Troponin was minimally elevated at 22 and remained flat.  She was referred for coronary CTA, which showed scattered calcifications in the LMCA, LAD, and LCx with less than 25% stenosis.  Today, Cheryl Velez reports that she has been feeling better without any additional chest pain.  She also denies shortness of breath and palpitations.  She has stable dependent ankle edema.  She sometimes has orthostatic lightheadedness when she first gets up in the morning, though she now knows to sit on the edge of her bed for about 30 seconds before standing up.  She has not passed out or fallen.  She anticipates moving to [place] with her husband sometime in the next year to be closer to her daughter.  ROS: See HPI  Studies Reviewed: SABRA        Coronary CTA (01/27/2024): Mild, nonobstructive CAD involving the LMCA, LAD, and LCx with less than 25% stenosis.  Coronary calcium  score 544 (80th percentile for age and sex matched controls).  Aortic atherosclerosis noted as well as scattered lung nodules in the visualized chest similar to prior chest CT from 12/2022.  Risk Assessment/Calculations:    CHA2DS2-VASc Score = 5   This indicates a 7.2% annual risk of stroke. The patient's score is based upon: CHF History: 1 HTN History: 1 Diabetes History: 0 Stroke History: 0 Vascular Disease History: 0 Age Score:  2 Gender Score: 1            Physical Exam:   VS:  BP 120/60 (BP Location: Left Arm, Patient Position: Sitting, Cuff Size: Large)   Pulse 70   Ht 5' 4 (1.626 m)   Wt 203 lb 6.4 oz (92.3 kg)   SpO2 96%   BMI 34.91 kg/m    Wt Readings from Last 3 Encounters:  03/02/24 203 lb 6.4 oz (92.3 kg)  01/20/24 200 lb 12.8 oz (91.1 kg)  01/14/24 194 lb (88 kg)    General:  NAD. Neck: No JVD or HJR. Lungs: Clear to auscultation bilaterally without wheezes or crackles. Heart: Regular rate and rhythm without murmurs, rubs, or gallops. Abdomen: Soft, nontender, nondistended. Extremities: Trace ankle edema bilaterally.  ASSESSMENT AND PLAN: .    Apical hypertrophic cardiomyopathy: No evidence of new heart failure signs or symptoms.  Continue current regimen, including metoprolol  to tartrate 12.5 mg twice daily.  Coronary artery disease: Minimal coronary artery disease noted on recent coronary CTA.  Chest pain has resolved.  Query component of microvascular dysfunction or vasospasm.  Continue current doses of metoprolol  and isosorbide  mononitrate.  Continue fenofibrate  and colestyramine given history of statin intolerance.  Patient is not interested in trying alternative therapy such as PCSK9 inhibitor or bempedoic acid.  Paroxysmal atrial fibrillation: No palpitations reported.  Continue low-dose metoprolol  and apixaban .  Hypertension: Blood pressure well-controlled today.  No changes to current regimen of isosorbide  mononitrate, losartan , and metoprolol  tartrate.  Dispo: Return to clinic in 6 months.  Signed, Lonni Hanson, MD

## 2024-03-03 ENCOUNTER — Encounter: Payer: Self-pay | Admitting: Internal Medicine

## 2024-03-03 DIAGNOSIS — I2510 Atherosclerotic heart disease of native coronary artery without angina pectoris: Secondary | ICD-10-CM | POA: Insufficient documentation

## 2024-04-10 ENCOUNTER — Emergency Department

## 2024-04-10 ENCOUNTER — Other Ambulatory Visit: Payer: Self-pay

## 2024-04-10 ENCOUNTER — Emergency Department: Admission: EM | Admit: 2024-04-10 | Discharge: 2024-04-10 | Disposition: A | Discharge status: Home / Self Care

## 2024-04-10 DIAGNOSIS — Z760 Encounter for issue of repeat prescription: Secondary | ICD-10-CM | POA: Diagnosis not present

## 2024-04-10 DIAGNOSIS — R918 Other nonspecific abnormal finding of lung field: Secondary | ICD-10-CM | POA: Diagnosis not present

## 2024-04-10 DIAGNOSIS — I482 Chronic atrial fibrillation, unspecified: Secondary | ICD-10-CM | POA: Diagnosis not present

## 2024-04-10 DIAGNOSIS — R42 Dizziness and giddiness: Secondary | ICD-10-CM | POA: Diagnosis not present

## 2024-04-10 DIAGNOSIS — R079 Chest pain, unspecified: Secondary | ICD-10-CM | POA: Diagnosis present

## 2024-04-10 DIAGNOSIS — Z7901 Long term (current) use of anticoagulants: Secondary | ICD-10-CM | POA: Diagnosis not present

## 2024-04-10 LAB — BASIC METABOLIC PANEL WITH GFR
Anion gap: 9 (ref 5–15)
BUN: 12 mg/dL (ref 8–23)
CO2: 25 mmol/L (ref 22–32)
Calcium: 10 mg/dL (ref 8.9–10.3)
Chloride: 108 mmol/L (ref 98–111)
Creatinine, Ser: 0.73 mg/dL (ref 0.44–1.00)
GFR, Estimated: >60 mL/min (ref >60)
Glucose, Bld: 119 mg/dL — ABNORMAL HIGH (ref 70–99)
Potassium: 4 mmol/L (ref 3.5–5.1)
Sodium: 141 mmol/L (ref 135–145)

## 2024-04-10 LAB — CBC
HCT: 39.6 % (ref 36.0–46.0)
Hemoglobin: 12.7 g/dL (ref 12.0–15.0)
MCH: 30.4 pg (ref 26.0–34.0)
MCHC: 32.1 g/dL (ref 30.0–36.0)
MCV: 94.7 fL (ref 80.0–100.0)
Platelets: 243 K/uL (ref 150–400)
RBC: 4.18 MIL/uL (ref 3.87–5.11)
RDW: 13.2 % (ref 11.5–15.5)
WBC: 6.5 K/uL (ref 4.0–10.5)
nRBC: 0 % (ref 0.0–0.2)

## 2024-04-10 LAB — TROPONIN T, HIGH SENSITIVITY
Troponin T High Sensitivity: 28 ng/L — ABNORMAL HIGH (ref 0–19)
Troponin T High Sensitivity: 27 ng/L — ABNORMAL HIGH (ref 0–19)

## 2024-04-10 MED ORDER — AZITHROMYCIN 250 MG PO TABS: ORAL_TABLET | ORAL | 0 refills | Status: AC

## 2024-04-10 MED ORDER — IOHEXOL 350 MG/ML SOLN
75.0000 mL | Freq: Once | INTRAVENOUS | Status: AC | PRN
  Administered 2024-04-10: 75 mL via INTRAVENOUS

## 2024-04-10 MED ORDER — APIXABAN 5 MG PO TABS
5.0000 mg | ORAL_TABLET | Freq: Once | ORAL | Status: AC
  Administered 2024-04-10: 5 mg via ORAL
  Filled 2024-04-10: qty 1

## 2024-04-10 MED ORDER — APIXABAN 5 MG PO TABS: 5.0000 mg | ORAL_TABLET | Freq: Two times a day (BID) | ORAL | 0 refills | Status: AC

## 2024-04-10 MED ORDER — AZITHROMYCIN 500 MG PO TABS
500.0000 mg | ORAL_TABLET | Freq: Once | ORAL | Status: AC
  Administered 2024-04-10: 500 mg via ORAL
  Filled 2024-04-10: qty 1

## 2024-04-10 NOTE — ED Provider Notes (Signed)
 [facility] Provider Note    Event Date/Time   First MD Initiated Contact with Patient 04/10/24 1551     (approximate)   History   Chest Pain   HPI  Cheryl Velez is a 83 y.o. female with a past medical history of atrial fibrillation on anticoagulation presenting to the emergency department complaining of chest pain which has been ongoing since around 5 PM yesterday.  The patient reports that she ran out of her Eliquis  3 days ago and that she is concerned that she may have a blood clot.  She reports some intermittent dizziness but states that this is not new for her.  She denies any shortness of breath, leg swelling, nausea, vomiting, sweats, or chills.     Physical Exam   Triage Vital Signs: ED Triage Vitals  Encounter Vitals Group     BP 04/10/24 1155 (!) 137/55     Girls Systolic BP Percentile --      Girls Diastolic BP Percentile --      Boys Systolic BP Percentile --      Boys Diastolic BP Percentile --      Pulse Rate 04/10/24 1155 60     Resp 04/10/24 1155 16     Temp 04/10/24 1155 98.4 F (36.9 C)     Temp Source 04/10/24 1155 Oral     SpO2 04/10/24 1155 96 %     Weight 04/10/24 1159 205 lb (93 kg)     Height 04/10/24 1159 5' 4 (1.626 m)     Head Circumference --      Peak Flow --      Pain Score 04/10/24 1156 2     Pain Loc --      Pain Education --      Exclude from Growth Chart --     Most recent vital signs: Vitals:   04/10/24 1155 04/10/24 1600  BP: (!) 137/55 (!) 128/58  Pulse: 60 61  Resp: 16 18  Temp: 98.4 F (36.9 C)   SpO2: 96% 96%     General: Awake, no distress.  CV:  Good peripheral perfusion.  Regular rate and rhythm. Resp:  Normal effort.  Abd:  No distention.     ED Results / Procedures / Treatments   Labs (all labs ordered are listed, but only abnormal results are displayed) Labs Reviewed  BASIC METABOLIC PANEL WITH GFR - Abnormal; Notable for the following components:      Result Value   Glucose,  Bld 119 (*)    All other components within normal limits  TROPONIN T, HIGH SENSITIVITY - Abnormal; Notable for the following components:   Troponin T High Sensitivity 28 (*)    All other components within normal limits  TROPONIN T, HIGH SENSITIVITY - Abnormal; Notable for the following components:   Troponin T High Sensitivity 27 (*)    All other components within normal limits  CBC     EKG  ED ECG REPORT I, Reche CHRISTELLA Leventhal, the attending physician, personally viewed and interpreted this ECG.  Date: 04/10/2024 Rate: 81bpm Rhythm: normal sinus rhythm QRS Axis: normal Intervals: normal ST/T Wave abnormalities: normal Narrative Interpretation: no evidence of acute ischemia    RADIOLOGY I have reviewed and interpreted the patient's chest x-ray and noted an opacity in the right lower lobe.  Radiologist interprets this as a 9 mm nodular density.  CTA chest: 1. No evidence of pulmonary embolism. 2. Multifocal and geographic ground glass opacities throughout  both lungs. Findings may be related to pulmonary edema or infectious/inflammatory process. 3. Mildly enlarged precarinal lymph node measuring 12 mm with no additional enlarged mediastinal, hilar, or axillary lymph nodes. 4. Stable pulmonary nodules measuring up to 4 mm; no routine follow-up imaging is recommended per Fleischner Society Guidelines.   PROCEDURES:  Critical Care performed: No  Procedures   MEDICATIONS ORDERED IN ED: Medications - No data to display   IMPRESSION / MDM / ASSESSMENT AND PLAN / ED COURSE  I reviewed the triage vital signs and the nursing notes.                              Differential diagnosis includes, but is not limited to, pulmonary embolism, MI, pleural effusion, pneumonia, arrhythmia  Patient's presentation is most consistent with acute presentation with potential threat to life or bodily function.  Patient is an 83 year old female with past medical history of atrial  fibrillation who has been off of her Eliquis  for the last 3 days.  Patient is very concerned for a blood clot.  CTA of the chest added to workup.  Patient's blood work is overall unremarkable.  Chest x-ray showed an opacity in the right lower lung.  On CTA there is no evidence of pulmonary embolism but is evidence of ground glass opacities throughout both lungs which may be concerning for infectious process.  She is also noted to have a mildly enlarged lymph node as well as stable pulmonary nodule nodules.  Discussed findings with the patient who reports that she has noticed a cough and congestion.  After discussion with the patient plan was made to treat her with azithromycin  for suspected pneumonia.  She will be provided with her normal home dose of Eliquis  here in the emergency department and I will provide her with a prescription for a weeks worth of her Eliquis  to take until she is able to figure out her issues with her regular prescription.  She is to follow-up with her primary care physician within the next few days.  Return to the emergency department for any new or worsening symptoms.     FINAL CLINICAL IMPRESSION(S) / ED DIAGNOSES   Final diagnoses:  Ground glass opacity present on imaging of lung  Medication refill     Rx / DC Orders   ED Discharge Orders     None        Note:  This document was prepared using Dragon voice recognition software and may include unintentional dictation errors.   [place] Reche HERO, MD 04/10/24 [phone]

## 2024-04-10 NOTE — ED Provider Notes (Signed)
 She has EKG reviewed.  T wave inversion in V2 and V3 is notable.  However when compared to old ECG from September 24, I do not see any significant new or obviously new ischemic change from her previous   Dicky Anes, MD 04/10/24 1205

## 2024-04-10 NOTE — ED Triage Notes (Signed)
 Pt to ED [facility] from home for chest pressure since yesterday 5pm. Was radiating to back yesterday. No nausea, SOB. Gets lightheaded when stands but this is chronic. Has been out of Eliquis  for 3 days.   VS: 114/68, 68 HR, NSR, 100% RA, has 20g L hand Was given 162mg  aspirin  en route because refused the whole dose.   Currently 2/10 mid chest pressure with no radiation. Unlabored respirations, dry skin. Hx a fib and hypertrophic cardiomyopathy.

## 2024-04-10 NOTE — ED Notes (Addendum)
 AVS with prescriptions provided to and discussed with patient. Pt verbalizes understanding of discharge instructions and denies any questions or concerns at this time. Pt ambulated out of department independently with steady gait.

## 2024-04-11 DIAGNOSIS — R918 Other nonspecific abnormal finding of lung field: Secondary | ICD-10-CM | POA: Diagnosis not present

## 2024-05-12 ENCOUNTER — Other Ambulatory Visit
